# Patient Record
Sex: Male | Born: 1945
Health system: Southern US, Community
[De-identification: ages and names within clinical notes are randomized; demographics above are authoritative.]

## PROBLEM LIST (undated history)

## (undated) DIAGNOSIS — I2510 Atherosclerotic heart disease of native coronary artery without angina pectoris: Secondary | ICD-10-CM

## (undated) DIAGNOSIS — Z8739 Personal history of other diseases of the musculoskeletal system and connective tissue: Secondary | ICD-10-CM

## (undated) DIAGNOSIS — H269 Unspecified cataract: Secondary | ICD-10-CM

## (undated) DIAGNOSIS — K219 Gastro-esophageal reflux disease without esophagitis: Secondary | ICD-10-CM

## (undated) DIAGNOSIS — N433 Hydrocele, unspecified: Secondary | ICD-10-CM

## (undated) DIAGNOSIS — N529 Male erectile dysfunction, unspecified: Secondary | ICD-10-CM

## (undated) DIAGNOSIS — M1990 Unspecified osteoarthritis, unspecified site: Secondary | ICD-10-CM

## (undated) DIAGNOSIS — G4730 Sleep apnea, unspecified: Secondary | ICD-10-CM

## (undated) DIAGNOSIS — I10 Essential (primary) hypertension: Secondary | ICD-10-CM

## (undated) DIAGNOSIS — F419 Anxiety disorder, unspecified: Secondary | ICD-10-CM

## (undated) DIAGNOSIS — F32A Depression, unspecified: Secondary | ICD-10-CM

## (undated) DIAGNOSIS — E785 Hyperlipidemia, unspecified: Secondary | ICD-10-CM

## (undated) DIAGNOSIS — E119 Type 2 diabetes mellitus without complications: Secondary | ICD-10-CM

## (undated) DIAGNOSIS — E291 Testicular hypofunction: Secondary | ICD-10-CM

## (undated) HISTORY — DX: Anxiety disorder, unspecified: F41.9

## (undated) HISTORY — PX: HERNIA REPAIR: SHX51

## (undated) HISTORY — DX: Depression, unspecified: F32.A

## (undated) HISTORY — DX: Unspecified cataract: H26.9

## (undated) HISTORY — DX: Sleep apnea, unspecified: G47.30

## (undated) HISTORY — PX: APPENDECTOMY: SHX54

## (undated) HISTORY — PX: HEMORROIDECTOMY: SUR656

## (undated) HISTORY — PX: UMBILICAL HERNIA REPAIR: SHX196

## (undated) HISTORY — PX: CARDIAC CATHETERIZATION: SHX172

---

## 2000-12-30 ENCOUNTER — Encounter: Admission: RE | Admit: 2000-12-30 | Discharge: 2000-12-30 | Payer: Self-pay | Admitting: Nephrology

## 2000-12-30 ENCOUNTER — Encounter: Payer: Self-pay | Admitting: Nephrology

## 2001-01-06 ENCOUNTER — Encounter: Admission: RE | Admit: 2001-01-06 | Discharge: 2001-01-06 | Payer: Self-pay | Admitting: Nephrology

## 2001-01-06 ENCOUNTER — Encounter: Payer: Self-pay | Admitting: Nephrology

## 2001-11-18 ENCOUNTER — Ambulatory Visit (HOSPITAL_BASED_OUTPATIENT_CLINIC_OR_DEPARTMENT_OTHER): Admission: RE | Admit: 2001-11-18 | Discharge: 2001-11-18 | Payer: Self-pay | Admitting: General Surgery

## 2012-05-05 LAB — PROTIME-INR

## 2015-01-10 DIAGNOSIS — Z125 Encounter for screening for malignant neoplasm of prostate: Secondary | ICD-10-CM | POA: Diagnosis not present

## 2015-01-10 DIAGNOSIS — I10 Essential (primary) hypertension: Secondary | ICD-10-CM | POA: Diagnosis not present

## 2015-01-10 DIAGNOSIS — E782 Mixed hyperlipidemia: Secondary | ICD-10-CM | POA: Diagnosis not present

## 2015-01-10 DIAGNOSIS — E119 Type 2 diabetes mellitus without complications: Secondary | ICD-10-CM | POA: Diagnosis not present

## 2015-01-10 DIAGNOSIS — I2510 Atherosclerotic heart disease of native coronary artery without angina pectoris: Secondary | ICD-10-CM | POA: Diagnosis not present

## 2015-03-05 ENCOUNTER — Emergency Department (HOSPITAL_BASED_OUTPATIENT_CLINIC_OR_DEPARTMENT_OTHER)
Admission: EM | Admit: 2015-03-05 | Discharge: 2015-03-05 | Disposition: A | Discharge status: Home / Self Care | Payer: Medicare Other | Attending: Emergency Medicine | Admitting: Emergency Medicine

## 2015-03-05 ENCOUNTER — Encounter (HOSPITAL_BASED_OUTPATIENT_CLINIC_OR_DEPARTMENT_OTHER): Payer: Self-pay | Admitting: Emergency Medicine

## 2015-03-05 DIAGNOSIS — Z8739 Personal history of other diseases of the musculoskeletal system and connective tissue: Secondary | ICD-10-CM | POA: Diagnosis not present

## 2015-03-05 DIAGNOSIS — E119 Type 2 diabetes mellitus without complications: Secondary | ICD-10-CM | POA: Diagnosis not present

## 2015-03-05 DIAGNOSIS — J069 Acute upper respiratory infection, unspecified: Secondary | ICD-10-CM | POA: Insufficient documentation

## 2015-03-05 DIAGNOSIS — I10 Essential (primary) hypertension: Secondary | ICD-10-CM | POA: Diagnosis not present

## 2015-03-05 DIAGNOSIS — Z87891 Personal history of nicotine dependence: Secondary | ICD-10-CM | POA: Insufficient documentation

## 2015-03-05 DIAGNOSIS — R05 Cough: Secondary | ICD-10-CM | POA: Diagnosis not present

## 2015-03-05 HISTORY — DX: Hyperlipidemia, unspecified: E78.5

## 2015-03-05 HISTORY — DX: Essential (primary) hypertension: I10

## 2015-03-05 MED ORDER — GUAIFENESIN-CODEINE 100-10 MG/5ML PO SOLN: 10.0000 mL | Freq: Four times a day (QID) | ORAL | Status: DC | PRN

## 2015-03-05 NOTE — ED Notes (Signed)
Alert, NAD, calm, interactive, resps e/u, speaking in clear complete sentences, VSS, denies needs or questions, "ready to go", out with wife, Rx x1 given. Steady gait.

## 2015-03-05 NOTE — ED Notes (Signed)
Pt seen by EDP prior to RN assessment, see MD notes, orders received to d/c. Care assumed at time of d/c.

## 2015-03-05 NOTE — Discharge Instructions (Signed)
Robitussin with codeine as needed for cough.  Return to the ER if you develop significant worsening of your symptoms.   Upper Respiratory Infection, Adult An upper respiratory infection (URI) is also sometimes known as the common cold. The upper respiratory tract includes the nose, sinuses, throat, trachea, and bronchi. Bronchi are the airways leading to the lungs. Most people improve within 1 week, but symptoms can last up to 2 weeks. A residual cough may last even longer.  CAUSES Many different viruses can infect the tissues lining the upper respiratory tract. The tissues become irritated and inflamed and often become very moist. Mucus production is also common. A cold is contagious. You can easily spread the virus to others by oral contact. This includes kissing, sharing a glass, coughing, or sneezing. Touching your mouth or nose and then touching a surface, which is then touched by another person, can also spread the virus. SYMPTOMS  Symptoms typically develop 1 to 3 days after you come in contact with a cold virus. Symptoms vary from person to person. They may include:  Runny nose.  Sneezing.  Nasal congestion.  Sinus irritation.  Sore throat.  Loss of voice (laryngitis).  Cough.  Fatigue.  Muscle aches.  Loss of appetite.  Headache.  Low-grade fever. DIAGNOSIS  You might diagnose your own cold based on familiar symptoms, since most people get a cold 2 to 3 times a year. Your caregiver can confirm this based on your exam. Most importantly, your caregiver can check that your symptoms are not due to another disease such as strep throat, sinusitis, pneumonia, asthma, or epiglottitis. Blood tests, throat tests, and X-rays are not necessary to diagnose a common cold, but they may sometimes be helpful in excluding other more serious diseases. Your caregiver will decide if any further tests are required. RISKS AND COMPLICATIONS  You may be at risk for a more severe case of the  common cold if you smoke cigarettes, have chronic heart disease (such as heart failure) or lung disease (such as asthma), or if you have a weakened immune system. The very young and very old are also at risk for more serious infections. Bacterial sinusitis, middle ear infections, and bacterial pneumonia can complicate the common cold. The common cold can worsen asthma and chronic obstructive pulmonary disease (COPD). Sometimes, these complications can require emergency medical care and may be life-threatening. PREVENTION  The best way to protect against getting a cold is to practice good hygiene. Avoid oral or hand contact with people with cold symptoms. Wash your hands often if contact occurs. There is no clear evidence that vitamin C, vitamin E, echinacea, or exercise reduces the chance of developing a cold. However, it is always recommended to get plenty of rest and practice good nutrition. TREATMENT  Treatment is directed at relieving symptoms. There is no cure. Antibiotics are not effective, because the infection is caused by a virus, not by bacteria. Treatment may include:  Increased fluid intake. Sports drinks offer valuable electrolytes, sugars, and fluids.  Breathing heated mist or steam (vaporizer or shower).  Eating chicken soup or other clear broths, and maintaining good nutrition.  Getting plenty of rest.  Using gargles or lozenges for comfort.  Controlling fevers with ibuprofen or acetaminophen as directed by your caregiver.  Increasing usage of your inhaler if you have asthma. Zinc gel and zinc lozenges, taken in the first 24 hours of the common cold, can shorten the duration and lessen the severity of symptoms. Pain medicines may help  with fever, muscle aches, and throat pain. A variety of non-prescription medicines are available to treat congestion and runny nose. Your caregiver can make recommendations and may suggest nasal or lung inhalers for other symptoms.  HOME CARE  INSTRUCTIONS   Only take over-the-counter or prescription medicines for pain, discomfort, or fever as directed by your caregiver.  Use a warm mist humidifier or inhale steam from a shower to increase air moisture. This may keep secretions moist and make it easier to breathe.  Drink enough water and fluids to keep your urine clear or pale yellow.  Rest as needed.  Return to work when your temperature has returned to normal or as your caregiver advises. You may need to stay home longer to avoid infecting others. You can also use a face mask and careful hand washing to prevent spread of the virus. SEEK MEDICAL CARE IF:   After the first few days, you feel you are getting worse rather than better.  You need your caregiver's advice about medicines to control symptoms.  You develop chills, worsening shortness of breath, or brown or red sputum. These may be signs of pneumonia.  You develop yellow or brown nasal discharge or pain in the face, especially when you bend forward. These may be signs of sinusitis.  You develop a fever, swollen neck glands, pain with swallowing, or white areas in the back of your throat. These may be signs of strep throat. SEEK IMMEDIATE MEDICAL CARE IF:   You have a fever.  You develop severe or persistent headache, ear pain, sinus pain, or chest pain.  You develop wheezing, a prolonged cough, cough up blood, or have a change in your usual mucus (if you have chronic lung disease).  You develop sore muscles or a stiff neck. Document Released: 05/15/2001 Document Revised: 02/11/2012 Document Reviewed: 02/24/2014 [facility] Patient Information 2015 [place], [place]. This information is not intended to replace advice given to you by your health care provider. Make sure you discuss any questions you have with your health care provider.

## 2015-03-05 NOTE — ED Provider Notes (Signed)
CSN: [id number]     Arrival date & time 03/05/15  1930 History  This chart was scribed for Veryl Speak, MD by Jeanell Sparrow, ED Scribe. This patient was seen in room MH02/MH02 and the patient's care was started at 8:43 PM.    Chief Complaint  Patient presents with  . URI   Patient is a 69 y.o. male presenting with URI. The history is provided by the patient. No language interpreter was used.  URI Presenting symptoms: congestion, cough and sore throat   Presenting symptoms: no ear pain and no fever   Severity:  Moderate Onset quality:  Sudden Duration:  2 days Timing:  Constant Progression:  Unchanged Chronicity:  New Relieved by:  None tried Worsened by:  Nothing tried Ineffective treatments:  None tried Risk factors: sick contacts     HPI Comments: JAMAEL HOFFMANN is a 69 y.o. male who presents to the Emergency Department complaining of a constant moderate sore throat that started yesterday. He reports that he has been sick since yesterday with sore throat, nasal congestion, sinus pressure, intermittent moderate cough, and nose bleeds. He reports no modifying factors for the sore throat. He states that he has sick contacts. He reports that he is currently on a blood thinner. He denies any hx of smoking. He also denies any fever or ear pain.   Past Medical History  Diagnosis Date  . Hypertension   . Diabetes mellitus without complication   . Hyperlipemia   . Gout    History reviewed. No pertinent past surgical history. History reviewed. No pertinent family history. History  Substance Use Topics  . Smoking status: Former Research scientist (life sciences)  . Smokeless tobacco: Not on file  . Alcohol Use: No    Review of Systems  Constitutional: Negative for fever.  HENT: Positive for congestion, nosebleeds and sore throat. Negative for ear pain and sinus pressure.   Respiratory: Positive for cough.   Hematological: Bruises/bleeds easily.   Allergies  Review of patient's allergies indicates no known  allergies.  Home Medications   Prior to Admission medications   Not on File   BP 160/90 mmHg  Pulse 86  Temp(Src) 97.8 F (36.6 C) (Oral)  Resp 18  SpO2 96% Physical Exam  Constitutional: He is oriented to person, place, and time. He appears well-developed and well-nourished. No distress.  HENT:  Head: Normocephalic and atraumatic.  Mouth/Throat: Oropharynx is clear and moist. No oropharyngeal exudate.  Maxillary facial TTP.   Eyes: Conjunctivae and EOM are normal. Pupils are equal, round, and reactive to light.  Neck: Normal range of motion. Neck supple.  Cardiovascular: Normal rate, regular rhythm, normal heart sounds and intact distal pulses.   No murmur heard. Pulmonary/Chest: Effort normal and breath sounds normal. No respiratory distress.  Abdominal: Soft. There is no tenderness. There is no rebound and no guarding.  Musculoskeletal: Normal range of motion. He exhibits no edema or tenderness.  Neurological: He is alert and oriented to person, place, and time. No cranial nerve deficit. He exhibits normal muscle tone. Coordination normal.  Skin: Skin is warm.  Psychiatric: He has a normal mood and affect. His behavior is normal.  Nursing note and vitals reviewed.   ED Course  Procedures (including critical care time) DIAGNOSTIC STUDIES: Oxygen Saturation is 96% on RA, normal by my interpretation.    COORDINATION OF CARE: 8:47 PM- Pt advised of plan for treatment which includes medication and pt agrees.  Labs Review Labs Reviewed - No data to display  Imaging Review No results found.   EKG Interpretation None      MDM   Final diagnoses:  None    Patient presents with URI-like symptoms that started yesterday. Symptoms are likely viral in nature. His lungs are clear and oxygen saturations are normal. I see no indication for antibiotics. He is requesting something for cough and will be prescribed Robitussin with codeine which she can take as needed.  I  personally performed the services described in this documentation, which was scribed in my presence. The recorded information has been reviewed and is accurate.       Veryl Speak, MD 03/06/15 [phone]

## 2015-03-05 NOTE — ED Notes (Signed)
Pt reports symptoms onset 1 day ago OTC medication ineffective

## 2015-04-11 DIAGNOSIS — E119 Type 2 diabetes mellitus without complications: Secondary | ICD-10-CM | POA: Diagnosis not present

## 2015-04-11 DIAGNOSIS — I10 Essential (primary) hypertension: Secondary | ICD-10-CM | POA: Diagnosis not present

## 2015-04-11 DIAGNOSIS — I2510 Atherosclerotic heart disease of native coronary artery without angina pectoris: Secondary | ICD-10-CM | POA: Diagnosis not present

## 2015-04-11 DIAGNOSIS — E669 Obesity, unspecified: Secondary | ICD-10-CM | POA: Diagnosis not present

## 2015-04-11 DIAGNOSIS — E782 Mixed hyperlipidemia: Secondary | ICD-10-CM | POA: Diagnosis not present

## 2015-04-11 DIAGNOSIS — N529 Male erectile dysfunction, unspecified: Secondary | ICD-10-CM | POA: Diagnosis not present

## 2015-04-26 DIAGNOSIS — Z62891 Sibling rivalry: Secondary | ICD-10-CM | POA: Diagnosis not present

## 2015-04-26 DIAGNOSIS — Z9149 Other personal history of psychological trauma, not elsewhere classified: Secondary | ICD-10-CM | POA: Diagnosis not present

## 2015-04-26 DIAGNOSIS — F4310 Post-traumatic stress disorder, unspecified: Secondary | ICD-10-CM | POA: Diagnosis not present

## 2015-06-02 DIAGNOSIS — J0110 Acute frontal sinusitis, unspecified: Secondary | ICD-10-CM | POA: Diagnosis not present

## 2015-06-02 DIAGNOSIS — E119 Type 2 diabetes mellitus without complications: Secondary | ICD-10-CM | POA: Diagnosis not present

## 2015-06-21 DIAGNOSIS — Z62891 Sibling rivalry: Secondary | ICD-10-CM | POA: Diagnosis not present

## 2015-06-21 DIAGNOSIS — Z9149 Other personal history of psychological trauma, not elsewhere classified: Secondary | ICD-10-CM | POA: Diagnosis not present

## 2015-06-21 DIAGNOSIS — F4310 Post-traumatic stress disorder, unspecified: Secondary | ICD-10-CM | POA: Diagnosis not present

## 2015-07-07 DIAGNOSIS — K219 Gastro-esophageal reflux disease without esophagitis: Secondary | ICD-10-CM | POA: Diagnosis not present

## 2015-07-26 DIAGNOSIS — F4310 Post-traumatic stress disorder, unspecified: Secondary | ICD-10-CM | POA: Diagnosis not present

## 2015-07-26 DIAGNOSIS — Z9149 Other personal history of psychological trauma, not elsewhere classified: Secondary | ICD-10-CM | POA: Diagnosis not present

## 2015-07-26 DIAGNOSIS — Z62891 Sibling rivalry: Secondary | ICD-10-CM | POA: Diagnosis not present

## 2015-08-01 DIAGNOSIS — E119 Type 2 diabetes mellitus without complications: Secondary | ICD-10-CM | POA: Diagnosis not present

## 2015-08-01 DIAGNOSIS — I10 Essential (primary) hypertension: Secondary | ICD-10-CM | POA: Diagnosis not present

## 2015-08-01 DIAGNOSIS — Z1329 Encounter for screening for other suspected endocrine disorder: Secondary | ICD-10-CM | POA: Diagnosis not present

## 2015-08-25 DIAGNOSIS — E669 Obesity, unspecified: Secondary | ICD-10-CM | POA: Diagnosis not present

## 2015-08-25 DIAGNOSIS — I2510 Atherosclerotic heart disease of native coronary artery without angina pectoris: Secondary | ICD-10-CM | POA: Diagnosis not present

## 2015-08-25 DIAGNOSIS — Z23 Encounter for immunization: Secondary | ICD-10-CM | POA: Diagnosis not present

## 2015-08-25 DIAGNOSIS — E119 Type 2 diabetes mellitus without complications: Secondary | ICD-10-CM | POA: Diagnosis not present

## 2015-08-25 DIAGNOSIS — E785 Hyperlipidemia, unspecified: Secondary | ICD-10-CM | POA: Diagnosis not present

## 2015-08-25 DIAGNOSIS — I10 Essential (primary) hypertension: Secondary | ICD-10-CM | POA: Diagnosis not present

## 2015-09-22 DIAGNOSIS — E119 Type 2 diabetes mellitus without complications: Secondary | ICD-10-CM | POA: Diagnosis not present

## 2015-09-22 DIAGNOSIS — H52223 Regular astigmatism, bilateral: Secondary | ICD-10-CM | POA: Diagnosis not present

## 2015-09-22 DIAGNOSIS — H04123 Dry eye syndrome of bilateral lacrimal glands: Secondary | ICD-10-CM | POA: Diagnosis not present

## 2015-09-22 DIAGNOSIS — H2513 Age-related nuclear cataract, bilateral: Secondary | ICD-10-CM | POA: Diagnosis not present

## 2015-09-22 DIAGNOSIS — H524 Presbyopia: Secondary | ICD-10-CM | POA: Diagnosis not present

## 2015-11-01 DIAGNOSIS — B301 Conjunctivitis due to adenovirus: Secondary | ICD-10-CM | POA: Diagnosis not present

## 2015-11-08 ENCOUNTER — Encounter (HOSPITAL_BASED_OUTPATIENT_CLINIC_OR_DEPARTMENT_OTHER): Payer: Self-pay | Admitting: *Deleted

## 2015-11-08 ENCOUNTER — Emergency Department (HOSPITAL_BASED_OUTPATIENT_CLINIC_OR_DEPARTMENT_OTHER): Payer: Medicare Other

## 2015-11-08 ENCOUNTER — Emergency Department (HOSPITAL_BASED_OUTPATIENT_CLINIC_OR_DEPARTMENT_OTHER)
Admission: EM | Admit: 2015-11-08 | Discharge: 2015-11-08 | Disposition: A | Discharge status: Home / Self Care | Payer: Medicare Other | Attending: Emergency Medicine | Admitting: Emergency Medicine

## 2015-11-08 DIAGNOSIS — Z87891 Personal history of nicotine dependence: Secondary | ICD-10-CM | POA: Diagnosis not present

## 2015-11-08 DIAGNOSIS — I10 Essential (primary) hypertension: Secondary | ICD-10-CM | POA: Diagnosis not present

## 2015-11-08 DIAGNOSIS — J029 Acute pharyngitis, unspecified: Secondary | ICD-10-CM

## 2015-11-08 DIAGNOSIS — Z8719 Personal history of other diseases of the digestive system: Secondary | ICD-10-CM | POA: Diagnosis not present

## 2015-11-08 DIAGNOSIS — E119 Type 2 diabetes mellitus without complications: Secondary | ICD-10-CM | POA: Diagnosis not present

## 2015-11-08 DIAGNOSIS — R05 Cough: Secondary | ICD-10-CM | POA: Diagnosis not present

## 2015-11-08 DIAGNOSIS — R059 Cough, unspecified: Secondary | ICD-10-CM

## 2015-11-08 DIAGNOSIS — R058 Other specified cough: Secondary | ICD-10-CM

## 2015-11-08 MED ORDER — PANTOPRAZOLE SODIUM 20 MG PO TBEC: 20.0000 mg | DELAYED_RELEASE_TABLET | Freq: Every day | ORAL | Status: DC

## 2015-11-08 MED ORDER — AMOXICILLIN-POT CLAVULANATE 875-125 MG PO TABS: 1.0000 | ORAL_TABLET | Freq: Two times a day (BID) | ORAL | Status: DC

## 2015-11-08 MED ORDER — HYDROCODONE-HOMATROPINE 5-1.5 MG/5ML PO SYRP: 5.0000 mL | ORAL_SOLUTION | Freq: Four times a day (QID) | ORAL | Status: DC | PRN

## 2015-11-08 NOTE — Discharge Instructions (Signed)
1. Medications: Augmentin, Hycodan, Protonix, usual home medications 2. Treatment: rest, drink plenty of fluids, take tylenol or ibuprofen for fever control; stopped taking Prilosec 3. Follow Up: Please followup with your primary doctor in 3 days for discussion of your diagnoses and further evaluation after today's visit; if you do not have a primary care doctor use the resource guide provided to find one; Return to the ER for high fevers, difficulty breathing or other concerning symptoms   Gastroesophageal Reflux Disease, Adult Normally, food travels down the esophagus and stays in the stomach to be digested. However, when a person has gastroesophageal reflux disease (GERD), food and stomach acid move back up into the esophagus. When this happens, the esophagus becomes sore and inflamed. Over time, GERD can create small holes (ulcers) in the lining of the esophagus.  CAUSES This condition is caused by a problem with the muscle between the esophagus and the stomach (lower esophageal sphincter, or LES). Normally, the LES muscle closes after food passes through the esophagus to the stomach. When the LES is weakened or abnormal, it does not close properly, and that allows food and stomach acid to go back up into the esophagus. The LES can be weakened by certain dietary substances, medicines, and medical conditions, including:  Tobacco use.  Pregnancy.  Having a hiatal hernia.  Heavy alcohol use.  Certain foods and beverages, such as coffee, chocolate, onions, and peppermint. RISK FACTORS This condition is more likely to develop in:  People who have an increased body weight.  People who have connective tissue disorders.  People who use NSAID medicines. SYMPTOMS Symptoms of this condition include:  Heartburn.  Difficult or painful swallowing.  The feeling of having a lump in the throat.  Abitter taste in the mouth.  Bad breath.  Having a large amount of saliva.  Having an upset  or bloated stomach.  Belching.  Chest pain.  Shortness of breath or wheezing.  Ongoing (chronic) cough or a night-time cough.  Wearing away of tooth enamel.  Weight loss. Different conditions can cause chest pain. Make sure to see your health care provider if you experience chest pain. DIAGNOSIS Your health care provider will take a medical history and perform a physical exam. To determine if you have mild or severe GERD, your health care provider may also monitor how you respond to treatment. You may also have other tests, including:  An endoscopy toexamine your stomach and esophagus with a small camera.  A test thatmeasures the acidity level in your esophagus.  A test thatmeasures how much pressure is on your esophagus.  A barium swallow or modified barium swallow to show the shape, size, and functioning of your esophagus. TREATMENT The goal of treatment is to help relieve your symptoms and to prevent complications. Treatment for this condition may vary depending on how severe your symptoms are. Your health care provider may recommend:  Changes to your diet.  Medicine.  Surgery. HOME CARE INSTRUCTIONS Diet  Follow a diet as recommended by your health care provider. This may involve avoiding foods and drinks such as:  Coffee and tea (with or without caffeine).  Drinks that containalcohol.  Energy drinks and sports drinks.  Carbonated drinks or sodas.  Chocolate and cocoa.  Peppermint and mint flavorings.  Garlic and onions.  Horseradish.  Spicy and acidic foods, including peppers, chili powder, curry powder, vinegar, hot sauces, and barbecue sauce.  Citrus fruit juices and citrus fruits, such as oranges, lemons, and limes.  Tomato-based foods,  such as red sauce, chili, salsa, and pizza with red sauce.  Fried and fatty foods, such as donuts, french fries, potato chips, and high-fat dressings.  High-fat meats, such as hot dogs and fatty cuts of red and  white meats, such as rib eye steak, sausage, ham, and bacon.  High-fat dairy items, such as whole milk, butter, and cream cheese.  Eat small, frequent meals instead of large meals.  Avoid drinking large amounts of liquid with your meals.  Avoid eating meals during the 2-3 hours before bedtime.  Avoid lying down right after you eat.  Do not exercise right after you eat. General Instructions  Pay attention to any changes in your symptoms.  Take over-the-counter and prescription medicines only as told by your health care provider. Do not take aspirin, ibuprofen, or other NSAIDs unless your health care provider told you to do so.  Do not use any tobacco products, including cigarettes, chewing tobacco, and e-cigarettes. If you need help quitting, ask your health care provider.  Wear loose-fitting clothing. Do not wear anything tight around your waist that causes pressure on your abdomen.  Raise (elevate) the head of your bed 6 inches (15cm).  Try to reduce your stress, such as with yoga or meditation. If you need help reducing stress, ask your health care provider.  If you are overweight, reduce your weight to an amount that is healthy for you. Ask your health care provider for guidance about a safe weight loss goal.  Keep all follow-up visits as told by your health care provider. This is important. SEEK MEDICAL CARE IF:  You have new symptoms.  You have unexplained weight loss.  You have difficulty swallowing, or it hurts to swallow.  You have wheezing or a persistent cough.  Your symptoms do not improve with treatment.  You have a hoarse voice. SEEK IMMEDIATE MEDICAL CARE IF:  You have pain in your arms, neck, jaw, teeth, or back.  You feel sweaty, dizzy, or light-headed.  You have chest pain or shortness of breath.  You vomit and your vomit looks like blood or coffee grounds.  You faint.  Your stool is bloody or black.  You cannot swallow, drink, or eat.     This information is not intended to replace advice given to you by your health care provider. Make sure you discuss any questions you have with your health care provider.   Document Released: 08/29/2005 Document Revised: 08/10/2015 Document Reviewed: 03/16/2015 Elsevier Interactive Patient Education [employer].

## 2015-11-08 NOTE — ED Provider Notes (Signed)
CSN: [id number]     Arrival date & time 11/08/15  1326 History   First MD Initiated Contact with Patient 11/08/15 1446     Chief Complaint  Patient presents with  . Cough     (Consider location/radiation/quality/duration/timing/severity/associated sxs/prior Treatment) The history is provided by the patient and medical records. No language interpreter was used.     Steven Payne is a 69 y.o. male  with a hx of Gout, HTN, NIDDM, hyperlipidemia, GERD presents to the Emergency Department complaining of gradual, persistent, progressively worsening reflux symptoms, productive cough onset 3 weeks ago.  Pt reports his sputum has been rust colored for approx 1 week.  He reports the change in sputum and associated rhinorrhea, sore throat began while on a cruise to the [place].  Pt reports he was seen by the MD on the ship and given an antibiotic x 5 (erythromycin) days without relief.  Pt reports the antibiotic did not help his symptoms.  Pt reports he takes prilosec for his reflux without relief.  He denies chest pain, shortness of breath, dyspnea on exertion. He reports that he has symptoms of acid wash which wake him from sleep with associated coughing.  He denies fever, chills, headache, neck pain, chest pain, shortness of breath, abdominal pain, nausea, vomiting, diarrhea, weakness, dizziness, syncope.   Past Medical History  Diagnosis Date  . Hypertension   . Diabetes mellitus without complication ([place])   . Hyperlipemia   . Gout    History reviewed. No pertinent past surgical history. No family history on file. Social History  Substance Use Topics  . Smoking status: Former Research scientist (life sciences)  . Smokeless tobacco: None  . Alcohol Use: No    Review of Systems  Constitutional: Negative for fever, chills, diaphoresis, appetite change, fatigue and unexpected weight change.  HENT: Positive for congestion, postnasal drip, rhinorrhea, sinus pressure and sore throat. Negative for ear discharge, ear pain  and mouth sores.   Eyes: Negative for visual disturbance.  Respiratory: Positive for cough. Negative for chest tightness, shortness of breath, wheezing and stridor.   Cardiovascular: Negative for chest pain, palpitations and leg swelling.  Gastrointestinal: Negative for nausea, vomiting, abdominal pain, diarrhea and constipation.  Endocrine: Negative for polydipsia, polyphagia and polyuria.  Genitourinary: Negative for dysuria, urgency, frequency and hematuria.  Musculoskeletal: Negative for myalgias, back pain, arthralgias and neck stiffness.  Skin: Negative for rash.  Allergic/Immunologic: Negative for immunocompromised state.  Neurological: Negative for syncope, light-headedness, numbness and headaches.  Hematological: Negative for adenopathy. Does not bruise/bleed easily.  Psychiatric/Behavioral: Negative for sleep disturbance. The patient is not nervous/anxious.   All other systems reviewed and are negative.     Allergies  Review of patient's allergies indicates no known allergies.  Home Medications   Prior to Admission medications   Medication Sig Start Date End Date Taking? Authorizing Provider  amoxicillin-clavulanate (AUGMENTIN) 875-125 MG tablet Take 1 tablet by mouth every 12 (twelve) hours. 11/08/15   Paisli Silfies, PA-C  guaiFENesin-codeine 100-10 MG/5ML syrup Take 10 mLs by mouth every 6 (six) hours as needed for cough. 03/05/15   Veryl Speak, MD  HYDROcodone-homatropine [facility]) 5-1.5 MG/5ML syrup Take 5 mLs by mouth every 6 (six) hours as needed for cough. 11/08/15   Tyreak Reagle, PA-C  pantoprazole (PROTONIX) 20 MG tablet Take 1 tablet (20 mg total) by mouth daily. 11/08/15   Keegan Ducey, PA-C   BP 141/85 mmHg  Pulse 68  Temp(Src) 98.6 F (37 C) (Oral)  Resp 20  Ht 5'  6" (1.676 m)  Wt 117.935 kg  BMI 41.99 kg/m2  SpO2 98% Physical Exam  Constitutional: He is oriented to person, place, and time. He appears well-developed and well-nourished. No  distress.  Awake, alert, nontoxic appearance  HENT:  Head: Normocephalic and atraumatic.  Right Ear: Tympanic membrane, external ear and ear canal normal.  Left Ear: Tympanic membrane, external ear and ear canal normal.  Nose: Mucosal edema and rhinorrhea present. No epistaxis. Right sinus exhibits no maxillary sinus tenderness and no frontal sinus tenderness. Left sinus exhibits no maxillary sinus tenderness and no frontal sinus tenderness.  Mouth/Throat: Uvula is midline and mucous membranes are normal. Mucous membranes are not pale and not cyanotic. Posterior oropharyngeal erythema present. No oropharyngeal exudate, posterior oropharyngeal edema or tonsillar abscesses.  Mild erythema of the posterior oropharynx without edema or exudate  Eyes: Conjunctivae are normal. Pupils are equal, round, and reactive to light. No scleral icterus.  Neck: Normal range of motion and full passive range of motion without pain. Neck supple.  No stridor, normal phonation, handling secretions without difficulty  Cardiovascular: Normal rate, regular rhythm, normal heart sounds and intact distal pulses.   Pulmonary/Chest: Effort normal and breath sounds normal. No stridor. No respiratory distress. He has no wheezes.  Clear and equal breath sounds without focal wheezes, rhonchi, rales  Abdominal: Soft. Bowel sounds are normal. He exhibits no mass. There is no tenderness. There is no rebound and no guarding.  Musculoskeletal: Normal range of motion. He exhibits no edema.  Lymphadenopathy:    He has no cervical adenopathy.  Neurological: He is alert and oriented to person, place, and time.  Speech is clear and goal oriented Moves extremities without ataxia  Skin: Skin is warm and dry. No rash noted. He is not diaphoretic.  Psychiatric: He has a normal mood and affect.  Nursing note and vitals reviewed.   ED Course  Procedures (including critical care time)  Imaging Review Dg Chest 2 View  11/08/2015   CLINICAL DATA:  Cough for 1 week EXAM: CHEST  2 VIEW COMPARISON:  None currently available FINDINGS: Normal heart size and mild aortic tortuosity. No acute infiltrate or edema. No effusion or pneumothorax. No acute osseous findings. IMPRESSION: No evidence of acute cardiopulmonary disease. Electronically Signed   By: Monte Fantasia M.D.   On: 11/08/2015 14:22   I have personally reviewed and evaluated these images and lab results as part of my medical decision-making.    MDM   Final diagnoses:  Cough  Reflux pharyngitis  Productive cough   [name] presents with cough 3 weeks worsening while on vacation.  Patient is a non-insulin-dependent diabetic.  He has had 3 weeks of cough with now rest colored sputum. He is afebrile without hypoxia or shortness of breath.  Concern for possible aspiration pneumonia with symptoms of acid wash at night and complaints of GERD. Patient is without chest pain, shortness of breath, dyspnea on exertion. Highly doubt ACS.  Patient is well-appearing here in the emergency department.  Pt CXR negative for acute infiltrate.  Pt will be discharged with symptomatic treatment.  Verbalizes understanding and is agreeable with plan. Pt is hemodynamically stable & in NAD prior to dc.  BP 141/85 mmHg  Pulse 68  Temp(Src) 98.6 F (37 C) (Oral)  Resp 20  Ht 5\' 6"  (1.676 m)  Wt 117.935 kg  BMI 41.99 kg/m2  SpO2 98%  The patient was discussed with Dr. Stark Jock who agrees with the treatment plan.  Jarrett Soho Chelbi Herber, PA-C 11/08/15 [place], MD 11/10/15 [phone]

## 2015-11-08 NOTE — ED Notes (Signed)
Cough for a week since cruise to the [place].

## 2015-11-16 DIAGNOSIS — B301 Conjunctivitis due to adenovirus: Secondary | ICD-10-CM | POA: Diagnosis not present

## 2015-12-26 DIAGNOSIS — Z9149 Other personal history of psychological trauma, not elsewhere classified: Secondary | ICD-10-CM | POA: Diagnosis not present

## 2015-12-26 DIAGNOSIS — F4310 Post-traumatic stress disorder, unspecified: Secondary | ICD-10-CM | POA: Diagnosis not present

## 2015-12-26 DIAGNOSIS — Z62891 Sibling rivalry: Secondary | ICD-10-CM | POA: Diagnosis not present

## 2016-01-01 ENCOUNTER — Encounter (HOSPITAL_BASED_OUTPATIENT_CLINIC_OR_DEPARTMENT_OTHER): Payer: Self-pay | Admitting: Emergency Medicine

## 2016-01-01 ENCOUNTER — Emergency Department (HOSPITAL_BASED_OUTPATIENT_CLINIC_OR_DEPARTMENT_OTHER)
Admission: EM | Admit: 2016-01-01 | Discharge: 2016-01-01 | Disposition: A | Discharge status: Home / Self Care | Payer: Medicare Other | Attending: Emergency Medicine | Admitting: Emergency Medicine

## 2016-01-01 DIAGNOSIS — R040 Epistaxis: Secondary | ICD-10-CM | POA: Diagnosis not present

## 2016-01-01 DIAGNOSIS — Z7984 Long term (current) use of oral hypoglycemic drugs: Secondary | ICD-10-CM | POA: Diagnosis not present

## 2016-01-01 DIAGNOSIS — I10 Essential (primary) hypertension: Secondary | ICD-10-CM | POA: Diagnosis not present

## 2016-01-01 DIAGNOSIS — Z87891 Personal history of nicotine dependence: Secondary | ICD-10-CM | POA: Insufficient documentation

## 2016-01-01 DIAGNOSIS — Z79899 Other long term (current) drug therapy: Secondary | ICD-10-CM | POA: Insufficient documentation

## 2016-01-01 DIAGNOSIS — E119 Type 2 diabetes mellitus without complications: Secondary | ICD-10-CM | POA: Insufficient documentation

## 2016-01-01 DIAGNOSIS — Z7902 Long term (current) use of antithrombotics/antiplatelets: Secondary | ICD-10-CM | POA: Insufficient documentation

## 2016-01-01 DIAGNOSIS — E785 Hyperlipidemia, unspecified: Secondary | ICD-10-CM | POA: Insufficient documentation

## 2016-01-01 DIAGNOSIS — Z794 Long term (current) use of insulin: Secondary | ICD-10-CM | POA: Insufficient documentation

## 2016-01-01 DIAGNOSIS — Z8739 Personal history of other diseases of the musculoskeletal system and connective tissue: Secondary | ICD-10-CM | POA: Diagnosis not present

## 2016-01-01 NOTE — Discharge Instructions (Signed)
Please follow-up with your doctor next week for reevaluation as needed. Return to ED for any new or worsening symptoms as we discussed.  Nosebleed Nosebleeds are common. They are due to a crack in the inside lining of your nose (mucous membrane) or from a small blood vessel that starts to bleed. Nosebleeds can be caused by many conditions, such as injury, infections, dry mucous membranes or dry climate, medicines, nose picking, and home heating and cooling systems. Most nosebleeds come from blood vessels in the front of your nose. HOME CARE INSTRUCTIONS   Try controlling your nosebleed by pinching your nostrils gently and continuously for at least 10 minutes.  Avoid blowing or sniffing your nose for a number of hours after having a nosebleed.  Do not put gauze inside your nose yourself. If your nose was packed by your health care provider, try to maintain the pack inside of your nose until your health care provider removes it.  If a gauze pack was used and it starts to fall out, gently replace it or cut off the end of it.  If a balloon catheter was used to pack your nose, do not cut or remove it unless your health care provider has instructed you to do that.  Avoid lying down while you are having a nosebleed. Sit up and lean forward.  Use a nasal spray decongestant to help with a nosebleed as directed by your health care provider.  Do not use petroleum jelly or mineral oil in your nose. These can drip into your lungs.  Maintain humidity in your home by using less air conditioning or by using a humidifier.  Aspirinand blood thinners make bleeding more likely. If you are prescribed these medicines and you suffer from nosebleeds, ask your health care provider if you should stop taking the medicines or adjust the dose. Do not stop medicines unless directed by your health care provider  Resume your normal activities as you are able, but avoid straining, lifting, or bending at the waist for  several days.  If your nosebleed was caused by dry mucous membranes, use over-the-counter saline nasal spray or gel. This will keep the mucous membranes moist and allow them to heal. If you must use a lubricant, choose the water-soluble variety. Use it only sparingly, and do not use it within several hours of lying down.  Keep all follow-up visits as directed by your health care provider. This is important. SEEK MEDICAL CARE IF:  You have a fever.  You get frequent nosebleeds.  You are getting nosebleeds more often. SEEK IMMEDIATE MEDICAL CARE IF:  Your nosebleed lasts longer than 20 minutes.  Your nosebleed occurs after an injury to your face, and your nose looks crooked or broken.  You have unusual bleeding from other parts of your body.  You have unusual bruising on other parts of your body.  You feel light-headed or you faint.  You become sweaty.  You vomit blood.  Your nosebleed occurs after a head injury.   This information is not intended to replace advice given to you by your health care provider. Make sure you discuss any questions you have with your health care provider.   Document Released: 08/29/2005 Document Revised: 12/10/2014 Document Reviewed: 07/05/2014 Elsevier Interactive Patient Education [employer].

## 2016-01-01 NOTE — ED Notes (Signed)
Drip pad applied to monitor drainage/ bleeding from nare

## 2016-01-01 NOTE — ED Provider Notes (Signed)
CSN: [id number]     Arrival date & time 01/01/16  1434 History   First MD Initiated Contact with Patient 01/01/16 1635     Chief Complaint  Patient presents with  . Epistaxis     (Consider location/radiation/quality/duration/timing/severity/associated sxs/prior Treatment) HPI Steven Payne is a 70 y.o. male history of diabetes, hypertension, currently on Plavix therapy, comes in for evaluation of nosebleed. Patient reports approximately 1:00 this afternoon, after blowing his nose hard, he started having nosebleed did not go away. He taped gauze around his nose which seemed to help somewhat. He denies any headache, vision changes, chest pain, shortness of breath. No epistaxis in the ED. Denies any other symptoms. No other modifying factors  Past Medical History  Diagnosis Date  . Hypertension   . Diabetes mellitus without complication ([employer])   . Hyperlipemia   . Gout    Past Surgical History  Procedure Laterality Date  . Appendectomy    . Hernia repair     No family history on file. Social History  Substance Use Topics  . Smoking status: Former Research scientist (life sciences)  . Smokeless tobacco: None  . Alcohol Use: No    Review of Systems A 10 point review of systems was completed and was negative except for pertinent positives and negatives as mentioned in the history of present illness     Allergies  Review of patient's allergies indicates no known allergies.  Home Medications   Prior to Admission medications   Medication Sig Start Date End Date Taking? Authorizing Provider  Albiglutide 50 MG PEN Inject into the skin.   Yes Historical Provider, MD  atorvastatin (LIPITOR) 10 MG tablet Take 10 mg by mouth daily.   Yes Historical Provider, MD  clopidogrel (PLAVIX) 75 MG tablet Take 75 mg by mouth daily.   Yes Historical Provider, MD  diltiazem (TIAZAC) 300 MG 24 hr capsule Take 300 mg by mouth daily.   Yes Historical Provider, MD  ezetimibe (ZETIA) 10 MG tablet Take 10 mg by mouth daily.    Yes Historical Provider, MD  famotidine (PEPCID) 40 MG tablet Take 40 mg by mouth daily.   Yes Historical Provider, MD  metFORMIN (GLUCOPHAGE) 1000 MG tablet Take 1,000 mg by mouth 2 (two) times daily with a meal.   Yes Historical Provider, MD  rosuvastatin (CRESTOR) 20 MG tablet Take 20 mg by mouth daily.   Yes Historical Provider, MD  amoxicillin-clavulanate (AUGMENTIN) 875-125 MG tablet Take 1 tablet by mouth every 12 (twelve) hours. 11/08/15   Hannah Muthersbaugh, PA-C  guaiFENesin-codeine 100-10 MG/5ML syrup Take 10 mLs by mouth every 6 (six) hours as needed for cough. 03/05/15   Veryl Speak, MD  HYDROcodone-homatropine [facility]) 5-1.5 MG/5ML syrup Take 5 mLs by mouth every 6 (six) hours as needed for cough. 11/08/15   Hannah Muthersbaugh, PA-C  pantoprazole (PROTONIX) 20 MG tablet Take 1 tablet (20 mg total) by mouth daily. 11/08/15   Hannah Muthersbaugh, PA-C   BP 136/76 mmHg  Pulse 64  Temp(Src) 98 F (36.7 C) (Oral)  Resp 18  Ht 5\' 6"  (1.676 m)  Wt 123.378 kg  BMI 43.92 kg/m2  SpO2 99% Physical Exam  Constitutional:  Awake, alert, nontoxic appearance. Overall well-appearing African-American male  HENT:  Head: Atraumatic.  Very small blood clot noted to left nares, medial septum. Hemostasis achieved. Oropharynx unremarkable  Eyes: Right eye exhibits no discharge. Left eye exhibits no discharge.  Neck: Neck supple.  Pulmonary/Chest: Effort normal. He exhibits no tenderness.  Abdominal: Soft. There  is no tenderness. There is no rebound.  Musculoskeletal: He exhibits no tenderness.  Baseline ROM, no obvious new focal weakness.  Neurological:  Mental status and motor strength appears baseline for patient and situation.  Skin: No rash noted.  Psychiatric: He has a normal mood and affect.  Nursing note and vitals reviewed.   ED Course  Procedures (including critical care time) Labs Review Labs Reviewed - No data to display  Imaging Review No results found. I have personally  reviewed and evaluated these images and lab results as part of my medical decision-making.   EKG Interpretation None     Filed Vitals:   01/01/16 1448 01/01/16 1741  BP: 145/91 136/76  Pulse: 68 64  Temp: 98 F (36.7 C)   TempSrc: Oral   Resp: 18 18  Height: 5\' 6"  (1.676 m)   Weight: 123.378 kg   SpO2: 99% 99%    MDM  Steven Payne is a 70 y.o. male on Plavix therapy who presents to the ED for epistaxis after blowing his nose too hard. Hemostasis achieved prior to arrival. Monitored in the ED for one hour with no repeat bleeding. He overall appears very well, nontoxic, hemodynamically stable and appropriate for outpatient follow-up. Will follow with PCP next week. Final diagnoses:  Epistaxis       Comer Locket, PA-C 01/01/16 [place], MD 01/01/16 2258

## 2016-01-01 NOTE — ED Notes (Signed)
Pt on blood thinners and states has been blowing nose a bit more since yesterday and today nose began bleeding and hasn't stopped.  Bleed presently controlled with pressure cotton ball inserted.

## 2016-01-01 NOTE — ED Notes (Addendum)
Presents with bleeding from Left Nare, states onset today, at approx 1300hrs, bleeding occurred after blowing his nose per pt statement. States is on Plavix after having a cardiac cath. States has been on Plavix for the past 3 years. No bleeding noted at this time, appears to have a small blood clot in left nare at this time

## 2016-01-05 DIAGNOSIS — F4310 Post-traumatic stress disorder, unspecified: Secondary | ICD-10-CM | POA: Diagnosis not present

## 2016-01-05 DIAGNOSIS — Z62891 Sibling rivalry: Secondary | ICD-10-CM | POA: Diagnosis not present

## 2016-01-05 DIAGNOSIS — Z9149 Other personal history of psychological trauma, not elsewhere classified: Secondary | ICD-10-CM | POA: Diagnosis not present

## 2016-02-21 ENCOUNTER — Telehealth: Payer: Self-pay

## 2016-02-22 ENCOUNTER — Encounter: Payer: Self-pay | Admitting: Family

## 2016-02-22 ENCOUNTER — Ambulatory Visit (INDEPENDENT_AMBULATORY_CARE_PROVIDER_SITE_OTHER): Payer: Medicare Other | Admitting: Family

## 2016-02-22 VITALS — BP 146/86 | HR 64 | Temp 98.5°F | Resp 18 | Ht 66.0 in | Wt 278.4 lb

## 2016-02-22 DIAGNOSIS — E119 Type 2 diabetes mellitus without complications: Secondary | ICD-10-CM | POA: Diagnosis not present

## 2016-02-22 DIAGNOSIS — M1A9XX0 Chronic gout, unspecified, without tophus (tophi): Secondary | ICD-10-CM

## 2016-02-22 DIAGNOSIS — E785 Hyperlipidemia, unspecified: Secondary | ICD-10-CM | POA: Diagnosis not present

## 2016-02-22 DIAGNOSIS — I2510 Atherosclerotic heart disease of native coronary artery without angina pectoris: Secondary | ICD-10-CM

## 2016-02-22 DIAGNOSIS — I10 Essential (primary) hypertension: Secondary | ICD-10-CM

## 2016-02-22 DIAGNOSIS — K219 Gastro-esophageal reflux disease without esophagitis: Secondary | ICD-10-CM

## 2016-02-22 DIAGNOSIS — Z86010 Personal history of colonic polyps: Secondary | ICD-10-CM

## 2016-02-22 DIAGNOSIS — M109 Gout, unspecified: Secondary | ICD-10-CM

## 2016-02-22 LAB — BASIC METABOLIC PANEL
BUN: 9 mg/dL (ref 6–23)
CO2: 28 mEq/L (ref 19–32)
Calcium: 10 mg/dL (ref 8.4–10.5)
Chloride: 102 mEq/L (ref 96–112)
Creatinine, Ser: 0.91 mg/dL (ref 0.40–1.50)
GFR: 105.52 mL/min (ref >60.00)
Glucose, Bld: 116 mg/dL — ABNORMAL HIGH (ref 70–99)
Potassium: 3.8 mEq/L (ref 3.5–5.1)
Sodium: 140 mEq/L (ref 135–145)

## 2016-02-22 LAB — LIPID PANEL
Cholesterol: 236 mg/dL — ABNORMAL HIGH (ref 0–200)
HDL: 37.2 mg/dL — ABNORMAL LOW (ref >39.00)
LDL Cholesterol: 173 mg/dL — ABNORMAL HIGH (ref 0–99)
NonHDL: 199.04
Total CHOL/HDL Ratio: 6
Triglycerides: 132 mg/dL (ref 0.0–149.0)
VLDL: 26.4 mg/dL (ref 0.0–40.0)

## 2016-02-22 LAB — HEPATIC FUNCTION PANEL
ALT: 16 U/L (ref 0–53)
AST: 18 U/L (ref 0–37)
Albumin: 4.4 g/dL (ref 3.5–5.2)
Alkaline Phosphatase: 70 U/L (ref 39–117)
Bilirubin, Direct: 0.1 mg/dL (ref 0.0–0.3)
Total Bilirubin: 0.4 mg/dL (ref 0.2–1.2)
Total Protein: 7.9 g/dL (ref 6.0–8.3)

## 2016-02-22 LAB — HEMOGLOBIN A1C: Hgb A1c MFr Bld: 6.5 % (ref 4.6–6.5)

## 2016-02-22 LAB — MICROALBUMIN / CREATININE URINE RATIO
Creatinine,U: 298.3 mg/dL
Microalb Creat Ratio: 0.5 mg/g (ref 0.0–30.0)
Microalb, Ur: 1.4 mg/dL (ref 0.0–1.9)

## 2016-02-22 LAB — URIC ACID: Uric Acid, Serum: 7.7 mg/dL (ref 4.0–7.8)

## 2016-02-22 MED ORDER — PANTOPRAZOLE SODIUM 40 MG PO TBEC: 40.0000 mg | DELAYED_RELEASE_TABLET | Freq: Every day | ORAL | Status: DC

## 2016-02-22 NOTE — Patient Instructions (Signed)
Complete lab work prior to leaving. Stop pepcid, start protonix  For heartburn. Schedule a medicare wellness visit at the front desk.  Keep up the regular exercise and work on weight loss. You will be contacted about your referral to cardiology and for colonoscopy.  Welcome to [facility]!

## 2016-02-22 NOTE — Progress Notes (Signed)
Pre visit review using our clinic review tool, if applicable. No additional management support is needed unless otherwise documented below in the visit note. 

## 2016-02-22 NOTE — Progress Notes (Signed)
Subjective:    Patient ID: Steven Payne, male    DOB: December 17, 1945, 70 y.o.   MRN: [id number]  HPI  Mr.  Steven Payne is a 70 yr old male who presents today to establish care. Previously followed at [facility].    1) DM2- Pt reports that he was diagnosed 10-12 years ago.   Currently maintained on metformin. Has used tanzeum in the past but this caused severe constipation.  He checks his sugars 3-4 times a week.  Reports that his sugars usually hover around 100 in the AM.    2) HTN- reports that he has had HTN x 30 years.  On diovan HCT.  Has not taken meds this AM.    BP Readings from Last 3 Encounters:  02/22/16 146/86  01/01/16 136/76  11/08/15 139/93   3) ED- uses cialis. Reports this is effective.   4) Hyperlipidemia- pt is maintained on zetia.  Has tolerated statins in the past.    5) Gout- Has not had flare in years.    6) GERD- uncontrolled on pepcid.   Patient is maintained on plavix. Reports that he had a cardiac cath several years ago and placed on plavix at that time.  Was told that he has a "previous hx of heart attack."      Review of Systems  Constitutional:       Reports weight has been as high as 275 Goes to they gym 30 min a day  HENT: Negative for rhinorrhea.   Eyes: Negative for visual disturbance.  Respiratory: Negative for cough.   Cardiovascular: Negative for leg swelling.  Gastrointestinal: Negative for diarrhea, constipation and blood in stool.  Genitourinary: Negative for dysuria and frequency.  Musculoskeletal: Negative for myalgias and arthralgias.  Skin: Negative for rash.  Neurological: Negative for headaches.  Hematological: Negative for adenopathy.  Psychiatric/Behavioral:       Denies depression/anxiety   Past Medical History  Diagnosis Date  . Hypertension   . Diabetes mellitus without complication ([place])   . Hyperlipemia   . Gout     Social History   Social History  . Marital Status: Married    Spouse Name: N/A  . Number of Children: N/A   . Years of Education: N/A   Occupational History  . Not on file.   Social History Main Topics  . Smoking status: Former Research scientist (life sciences)  . Smokeless tobacco: Not on file  . Alcohol Use: No  . Drug Use: No  . Sexual Activity: No   Other Topics Concern  . 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  No grandchildren.  Oldest in [place], youngest in [place]   Completed bachelors, some graduate (Estate manager/land agent)   Enjoys travelling, walking, cruises    Past Surgical History  Procedure Laterality Date  . Appendectomy    . Hernia repair      Family History  Problem Relation Age of Onset  . Coronary artery disease Father   . Diabetes Father   . Prostate cancer Father     died at 14  . Coronary artery disease Mother     died age 70  . Alcohol abuse Mother   . Alcohol abuse Father   . Breast cancer Paternal Grandmother   . Cancer Mother     unsure, + thyroid cancer  Allergies  Allergen Reactions  . Tanzeum [Albiglutide] Other (See Comments)    constipation    Current Outpatient Prescriptions on File Prior to Visit  Medication Sig Dispense Refill  . clopidogrel (PLAVIX) 75 MG tablet Take 75 mg by mouth daily.    Marland Kitchen diltiazem (TIAZAC) 300 MG 24 hr capsule Take 300 mg by mouth daily.    . metFORMIN (GLUCOPHAGE) 1000 MG tablet Take 1,000 mg by mouth daily with breakfast.      No current facility-administered medications on file prior to visit.    BP 146/86 mmHg  Pulse 64  Temp(Src) 98.5 F (36.9 C) (Oral)  Resp 18  Ht 5\' 6"  (1.676 m)  Wt 278 lb 6.4 oz (126.281 kg)  BMI 44.96 kg/m2  SpO2 97%       Objective:   Physical Exam  Constitutional: He is oriented to person, place, and time. He appears well-developed and well-nourished. No distress.  HENT:  Head: Normocephalic and atraumatic.  Cardiovascular: Normal rate and  regular rhythm.   No murmur heard. Pulmonary/Chest: Effort normal and breath sounds normal. No respiratory distress. He has no wheezes. He has no rales.  Musculoskeletal: He exhibits no edema.  Neurological: He is alert and oriented to person, place, and time.  Skin: Skin is warm and dry.  Psychiatric: He has a normal mood and affect. His behavior is normal. Thought content normal.          Assessment & Plan:

## 2016-02-23 ENCOUNTER — Other Ambulatory Visit: Payer: Self-pay | Admitting: Family

## 2016-02-23 DIAGNOSIS — E119 Type 2 diabetes mellitus without complications: Secondary | ICD-10-CM

## 2016-02-23 DIAGNOSIS — M109 Gout, unspecified: Secondary | ICD-10-CM | POA: Insufficient documentation

## 2016-02-23 DIAGNOSIS — E785 Hyperlipidemia, unspecified: Secondary | ICD-10-CM

## 2016-02-23 DIAGNOSIS — Z62891 Sibling rivalry: Secondary | ICD-10-CM | POA: Diagnosis not present

## 2016-02-23 DIAGNOSIS — Z8739 Personal history of other diseases of the musculoskeletal system and connective tissue: Secondary | ICD-10-CM | POA: Insufficient documentation

## 2016-02-23 DIAGNOSIS — I10 Essential (primary) hypertension: Secondary | ICD-10-CM

## 2016-02-23 DIAGNOSIS — I25119 Atherosclerotic heart disease of native coronary artery with unspecified angina pectoris: Secondary | ICD-10-CM | POA: Insufficient documentation

## 2016-02-23 DIAGNOSIS — K219 Gastro-esophageal reflux disease without esophagitis: Secondary | ICD-10-CM | POA: Insufficient documentation

## 2016-02-23 DIAGNOSIS — F4310 Post-traumatic stress disorder, unspecified: Secondary | ICD-10-CM | POA: Diagnosis not present

## 2016-02-23 DIAGNOSIS — I2510 Atherosclerotic heart disease of native coronary artery without angina pectoris: Secondary | ICD-10-CM | POA: Insufficient documentation

## 2016-02-23 DIAGNOSIS — Z9149 Other personal history of psychological trauma, not elsewhere classified: Secondary | ICD-10-CM | POA: Diagnosis not present

## 2016-02-23 HISTORY — DX: Atherosclerotic heart disease of native coronary artery with unspecified angina pectoris: I25.119

## 2016-02-23 HISTORY — DX: Gout, unspecified: M10.9

## 2016-02-23 HISTORY — DX: Hyperlipidemia, unspecified: E78.5

## 2016-02-23 HISTORY — DX: Type 2 diabetes mellitus without complications: E11.9

## 2016-02-23 HISTORY — DX: Essential (primary) hypertension: I10

## 2016-02-23 NOTE — Assessment & Plan Note (Signed)
Clinically stable.  Obtain A1C, continue metformin.  Obtain urine microalbumin.

## 2016-02-23 NOTE — Assessment & Plan Note (Signed)
Uncontrolled on pepcid, d/c pepcid, start PPI

## 2016-02-23 NOTE — Telephone Encounter (Signed)
Cholesterol is high.  Lets have him d/c zetia, start atorvastatin. Sugar is at goal. Liver function and uric acid (gout) testing look good.

## 2016-02-23 NOTE — Assessment & Plan Note (Signed)
Clinically stable. Continue plavix, refer to cardiology.

## 2016-02-23 NOTE — Assessment & Plan Note (Signed)
BP up today, did not take meds.  Resume.

## 2016-02-23 NOTE — Assessment & Plan Note (Signed)
Obtain lipid panel. Will likely add statin.

## 2016-02-24 NOTE — Telephone Encounter (Signed)
Left message for pt to return my call.

## 2016-02-27 ENCOUNTER — Telehealth: Payer: Self-pay | Admitting: Family

## 2016-02-27 MED ORDER — ATORVASTATIN CALCIUM 40 MG PO TABS: 40.0000 mg | ORAL_TABLET | Freq: Every day | ORAL | Status: DC

## 2016-02-27 NOTE — Telephone Encounter (Signed)
Attempted to reach pt and left message to check mychart message.

## 2016-02-27 NOTE — Telephone Encounter (Signed)
Called left message to call back 

## 2016-02-27 NOTE — Telephone Encounter (Signed)
Caller name: Self Relationship to patient: Can be reached: [phone] Pharmacy:  Reason for call: Would like to have lab results called to him

## 2016-02-27 NOTE — Telephone Encounter (Signed)
See 02/23/16 refill note. Spoke with pt, results not released to mychart yet. Manual release performed. Pt aware.

## 2016-03-02 ENCOUNTER — Telehealth: Payer: Self-pay | Admitting: *Deleted

## 2016-03-02 MED ORDER — PANTOPRAZOLE SODIUM 40 MG PO TBEC: 40.0000 mg | DELAYED_RELEASE_TABLET | Freq: Every day | ORAL | Status: DC

## 2016-03-02 MED ORDER — ATORVASTATIN CALCIUM 40 MG PO TABS: 40.0000 mg | ORAL_TABLET | Freq: Every day | ORAL | Status: DC

## 2016-03-02 NOTE — Telephone Encounter (Signed)
Received fax from [facility] for atorvastatin and pantoprazole. REfills sent.

## 2016-03-12 ENCOUNTER — Ambulatory Visit: Payer: Medicare Other

## 2016-03-22 DIAGNOSIS — Z9149 Other personal history of psychological trauma, not elsewhere classified: Secondary | ICD-10-CM | POA: Diagnosis not present

## 2016-03-22 DIAGNOSIS — F4310 Post-traumatic stress disorder, unspecified: Secondary | ICD-10-CM | POA: Diagnosis not present

## 2016-03-22 DIAGNOSIS — Z62891 Sibling rivalry: Secondary | ICD-10-CM | POA: Diagnosis not present

## 2016-03-26 ENCOUNTER — Ambulatory Visit (INDEPENDENT_AMBULATORY_CARE_PROVIDER_SITE_OTHER): Payer: Medicare Other

## 2016-03-26 VITALS — BP 135/75 | HR 65 | Ht 67.0 in | Wt 280.6 lb

## 2016-03-26 DIAGNOSIS — Z0000 Encounter for general adult medical examination without abnormal findings: Secondary | ICD-10-CM | POA: Diagnosis not present

## 2016-03-26 NOTE — Progress Notes (Signed)
Pre visit review using our clinic review tool, if applicable. No additional management support is needed unless otherwise documented below in the visit note. 

## 2016-03-26 NOTE — Progress Notes (Signed)
Subjective:   Steven Payne is a 70 y.o. male who presents for an Initial Medicare Annual Wellness Visit.  Review of Systems: No ROS Cardiac Risk Factors include: advanced age (>66men, >48 women);male gender;hypertension;diabetes mellitus;obesity (BMI >30kg/m2) (Hyperlipidemia)  Sleep patterns: Sleeps at least 8 hours per night/wakes up at least 1-2 times per night to void.     Home Safety/Smoke Alarms: Feels safe at home.  Lives with wife in one level home.  Smoke alarms and security system present.   Firearm Safety: No firearms.   Seat Belt Safety/Bike Helmet:  Always wears seat belt.    Counseling: Eye Exam- 1 year ago.  Dental-  Plans to schedule an appt.   Male:  [facility]-5 years ago-[facility]; pt reports removal of 2 polyps; copy of records requested  PSA- Not noted in chart    Objective:    Today's Vitals   03/26/16 0900  BP: 135/75  Pulse: 65  Height: 5\' 7"  (1.702 m)  Weight: 280 lb 9.6 oz (127.279 kg)  SpO2: 97%  PainSc: 0-No pain   Body mass index is 43.94 kg/(m^2).  Current Medications (verified) Outpatient Encounter Prescriptions as of 03/26/2016  Medication Sig  . atorvastatin (LIPITOR) 40 MG tablet Take 1 tablet (40 mg total) by mouth daily.  . clopidogrel (PLAVIX) 75 MG tablet Take 75 mg by mouth daily.  Marland Kitchen diltiazem (TIAZAC) 300 MG 24 hr capsule Take 300 mg by mouth daily.  . metFORMIN (GLUCOPHAGE) 1000 MG tablet Take 1,000 mg by mouth daily with breakfast.   . pantoprazole (PROTONIX) 40 MG tablet Take 1 tablet (40 mg total) by mouth daily.  . tadalafil (CIALIS) 10 MG tablet Take 10 mg by mouth as needed for erectile dysfunction.  . valsartan-hydrochlorothiazide (DIOVAN-HCT) 160-25 MG tablet Take 1 tablet by mouth daily.   No facility-administered encounter medications on file as of 03/26/2016.    Allergies (verified) Tanzeum   History: Past Medical History  Diagnosis Date  . Hypertension   . Diabetes mellitus without complication ([place])   .  Hyperlipemia   . Gout    Past Surgical History  Procedure Laterality Date  . Appendectomy    . Hernia repair     Family History  Problem Relation Age of Onset  . Coronary artery disease Father   . Diabetes Father   . Prostate cancer Father     died at 34  . Coronary artery disease Mother     died age 73  . Alcohol abuse Mother   . Alcohol abuse Father   . Breast cancer Paternal Grandmother   . Cancer Mother     unsure, + thyroid cancer   Social History   Occupational History  . Not on file.   Social History Main Topics  . Smoking status: Former Smoker    Quit date: 03/27/1991  . Smokeless tobacco: Not on file  . Alcohol Use: 0.0 oz/week    0 Standard drinks or equivalent per week     Comment: Very rarely  . Drug Use: No  . Sexual Activity: No   Tobacco Counseling Counseling given: No   Activities of Daily Living In your present state of health, do you have any difficulty performing the following activities: 03/26/2016  Hearing? N  Vision? N  Difficulty concentrating or making decisions? N  Walking or climbing stairs? N  Dressing or bathing? N  Doing errands, shopping? N  Preparing Food and eating ? N  Using the Toilet? N  In the  past six months, have you accidently leaked urine? N  Do you have problems with loss of bowel control? N  Managing your Medications? N  Managing your Finances? N  Housekeeping or managing your Housekeeping? N    Immunizations and Health Maintenance  There is no immunization history on file for this patient. Health Maintenance Due  Topic Date Due  . Hepatitis C Screening  Mar 16, 1946  . FOOT EXAM  10/15/1954  . OPHTHALMOLOGY EXAM  10/15/1954  . TETANUS/TDAP  10/16/1963  . COLONOSCOPY  10/15/1994  . ZOSTAVAX  10/15/2004  . PNA vac Low Risk Adult (1 of 2 - PCV13) 10/15/2009    Patient Care Team: [name], NP as PCP - General (Internal Medicine)  Referrals have been placed cardiology and gastroenterology    Indicate any recent Medical Services you may have received from other than [facility] providers in the past year (date may be approximate).    Assessment:   This is a routine wellness examination for Steven Payne.   Hearing/Vision screen  Hearing Screening   125Hz  250Hz  500Hz  1000Hz  2000Hz  4000Hz  8000Hz   Right ear:   Pass Pass Pass Pass   Left ear:   Pass Pass Pass Pass   Comments: No hearing problems.    Vision Screening Comments: Last eye exam: 1 year ago; Engineer, mining glasses  Dietary issues and exercise activities discussed: Current Exercise Habits: Structured exercise class (Goes to [employer]), Type of exercise: treadmill;strength training/weights, Time (Minutes): 50, Frequency (Times/Week): 3, Weekly Exercise (Minutes/Week): 150   Diet:  Regular diet.  Eats at least 2 meals per day.  Mostly eats fast foods.     Goals    . Increase lean proteins    . Increase physical activity    . Increase water intake      Depression Screen PHQ 2/9 Scores 03/26/2016  PHQ - 2 Score 0    Fall Risk Fall Risk  03/26/2016  Falls in the past year? No    Cognitive Function: MMSE - Mini Mental State Exam 03/26/2016  Orientation to time 5  Orientation to Place 5  Registration 3  Attention/ Calculation 5  Recall 2  Language- name 2 objects 2  Language- repeat 1  Language- follow 3 step command 3  Language- read & follow direction 1  Write a sentence 1  Copy design 1  Total score 29    Screening Tests Health Maintenance  Topic Date Due  . Hepatitis C Screening  [birth year]  . FOOT EXAM  10/15/1954  . OPHTHALMOLOGY EXAM  10/15/1954  . TETANUS/TDAP  10/16/1963  . COLONOSCOPY  10/15/1994  . ZOSTAVAX  10/15/2004  . PNA vac Low Risk Adult (1 of 2 - PCV13) 10/15/2009  . INFLUENZA VACCINE  07/03/2016  . HEMOGLOBIN A1C  08/24/2016        Plan:  Continue doing brain stimulating activities (puzzles, reading, adult coloring books, staying active) to keep memory sharp.    Eat a healthy diet (full of fruits, vegetables, whole grains, lean protein, water--limit salt, fat, and sugar intake) and increase physical activity as tolerated.  Choose grilled, baked or broiled meat rather fried.  Drink plenty water.  Please obtain colonoscopy records.  Follow up with Melissa as scheduled.     During the course of the visit Nadeem was educated and counseled about the following appropriate screening and preventive services:   Vaccines to include Pneumoccal, Influenza, Hepatitis B, Td, Zostavax, HCV  Electrocardiogram  Colorectal cancer screening  Cardiovascular disease screening  Diabetes screening  Glaucoma screening  Nutrition counseling  Prostate cancer screening  Smoking cessation counseling  Patient Instructions (the written plan) were given to the patient.   Rudene Anda, RN   03/26/2016

## 2016-03-26 NOTE — Patient Instructions (Addendum)
Continue doing brain stimulating activities (puzzles, reading, adult coloring books, staying active) to keep memory sharp.   Eat a healthy diet (full of fruits, vegetables, whole grains, lean protein, water--limit salt, fat, and sugar intake) and increase physical activity as tolerated.  Choose grilled, baked or broiled meat rather than fried.  Drink plenty water.  Please obtain colonoscopy records.  Follow up with Melissa as scheduled.     Fat and Cholesterol Restricted Diet High levels of fat and cholesterol in your blood may lead to various health problems, such as diseases of the heart, blood vessels, gallbladder, liver, and pancreas. Fats are concentrated sources of energy that come in various forms. Certain types of fat, including saturated fat, may be harmful in excess. Cholesterol is a substance needed by your body in small amounts. Your body makes all the cholesterol it needs. Excess cholesterol comes from the food you eat. When you have high levels of cholesterol and saturated fat in your blood, health problems can develop because the excess fat and cholesterol will gather along the walls of your blood vessels, causing them to narrow. Choosing the right foods will help you control your intake of fat and cholesterol. This will help keep the levels of these substances in your blood within normal limits and reduce your risk of disease. WHAT IS MY PLAN? Your health care provider recommends that you:  Get no more than __________ % of the total calories in your daily diet from fat.  Limit your intake of saturated fat to less than ______% of your total calories each day.  Limit the amount of cholesterol in your diet to less than _________mg per day. WHAT TYPES OF FAT SHOULD I CHOOSE?  Choose healthy fats more often. Choose monounsaturated and polyunsaturated fats, such as olive and canola oil, flaxseeds, walnuts, almonds, and seeds.  Eat more omega-3 fats. Good choices include salmon,  mackerel, sardines, tuna, flaxseed oil, and ground flaxseeds. Aim to eat fish at least two times a week.  Limit saturated fats. Saturated fats are primarily found in animal products, such as meats, butter, and cream. Plant sources of saturated fats include palm oil, palm kernel oil, and coconut oil.  Avoid foods with partially hydrogenated oils in them. These contain trans fats. Examples of foods that contain trans fats are stick margarine, some tub margarines, cookies, crackers, and other baked goods. WHAT GENERAL GUIDELINES DO I NEED TO FOLLOW? These guidelines for healthy eating will help you control your intake of fat and cholesterol:  Check food labels carefully to identify foods with trans fats or high amounts of saturated fat.  Fill one half of your plate with vegetables and green salads.  Fill one fourth of your plate with whole grains. Look for the word "whole" as the first word in the ingredient list.  Fill one fourth of your plate with lean protein foods.  Limit fruit to two servings a day. Choose fruit instead of juice.  Eat more foods that contain soluble fiber. Examples of foods that contain this type of fiber are apples, broccoli, carrots, beans, peas, and barley. Aim to get 20-30 g of fiber per day.  Eat more home-cooked food and less restaurant, buffet, and fast food.  Limit or avoid alcohol.  Limit foods high in starch and sugar.  Limit fried foods.  Cook foods using methods other than frying. Baking, boiling, grilling, and broiling are all great options.  Lose weight if you are overweight. Losing just 5-10% of your initial body weight  can help your overall health and prevent diseases such as diabetes and heart disease. WHAT FOODS CAN I EAT? Grains Whole grains, such as whole wheat or whole grain breads, crackers, cereals, and pasta. Unsweetened oatmeal, bulgur, barley, quinoa, or brown rice. Corn or whole wheat flour tortillas. Vegetables Fresh or frozen  vegetables (raw, steamed, roasted, or grilled). Green salads. Fruits All fresh, canned (in natural juice), or frozen fruits. Meat and Other Protein Products Ground beef (85% or leaner), grass-fed beef, or beef trimmed of fat. Skinless chicken or [place]. Ground chicken or [place]. Pork trimmed of fat. All fish and seafood. Eggs. Dried beans, peas, or lentils. Unsalted nuts or seeds. Unsalted canned or dry beans. Dairy Low-fat dairy products, such as skim or 1% milk, 2% or reduced-fat cheeses, low-fat ricotta or cottage cheese, or plain low-fat yogurt. Fats and Oils Tub margarines without trans fats. Light or reduced-fat mayonnaise and salad dressings. Avocado. Olive, canola, sesame, or safflower oils. Natural peanut or almond butter (choose ones without added sugar and oil). The items listed above may not be a complete list of recommended foods or beverages. Contact your dietitian for more options. WHAT FOODS ARE NOT RECOMMENDED? Grains White bread. White pasta. White rice. Cornbread. Bagels, pastries, and croissants. Crackers that contain trans fat. Vegetables White potatoes. Corn. Creamed or fried vegetables. Vegetables in a cheese sauce. Fruits Dried fruits. Canned fruit in light or heavy syrup. Fruit juice. Meat and Other Protein Products Fatty cuts of meat. Ribs, chicken wings, bacon, sausage, bologna, salami, chitterlings, fatback, hot dogs, bratwurst, and packaged luncheon meats. Liver and organ meats. Dairy Whole or 2% milk, cream, half-and-half, and cream cheese. Whole milk cheeses. Whole-fat or sweetened yogurt. Full-fat cheeses. Nondairy creamers and whipped toppings. Processed cheese, cheese spreads, or cheese curds. Sweets and Desserts Corn syrup, sugars, honey, and molasses. Candy. Jam and jelly. Syrup. Sweetened cereals. Cookies, pies, cakes, donuts, muffins, and ice cream. Fats and Oils Butter, stick margarine, lard, shortening, ghee, or bacon fat. Coconut, palm kernel, or palm  oils. Beverages Alcohol. Sweetened drinks (such as sodas, lemonade, and fruit drinks or punches). The items listed above may not be a complete list of foods and beverages to avoid. Contact your dietitian for more information.   This information is not intended to replace advice given to you by your health care provider. Make sure you discuss any questions you have with your health care provider.   Document Released: 11/19/2005 Document Revised: 12/10/2014 Document Reviewed: 02/17/2014 Elsevier Interactive Patient Education 2016 [place].  Diabetes and Foot Care Diabetes may cause you to have problems because of poor blood supply (circulation) to your feet and legs. This may cause the skin on your feet to become thinner, break easier, and heal more slowly. Your skin may become dry, and the skin may peel and crack. You may also have nerve damage in your legs and feet causing decreased feeling in them. You may not notice minor injuries to your feet that could lead to infections or more serious problems. Taking care of your feet is one of the most important things you can do for yourself.  HOME CARE INSTRUCTIONS  Wear shoes at all times, even in the house. Do not go barefoot. Bare feet are easily injured.  Check your feet daily for blisters, cuts, and redness. If you cannot see the bottom of your feet, use a mirror or ask someone for help.  Wash your feet with warm water (do not use hot water) and mild soap. Then  pat your feet and the areas between your toes until they are completely dry. Do not soak your feet as this can dry your skin.  Apply a moisturizing lotion or petroleum jelly (that does not contain alcohol and is unscented) to the skin on your feet and to dry, brittle toenails. Do not apply lotion between your toes.  Trim your toenails straight across. Do not dig under them or around the cuticle. File the edges of your nails with an emery board or nail file.  Do not cut corns or  calluses or try to remove them with medicine.  Wear clean socks or stockings every day. Make sure they are not too tight. Do not wear knee-high stockings since they may decrease blood flow to your legs.  Wear shoes that fit properly and have enough cushioning. To break in new shoes, wear them for just a few hours a day. This prevents you from injuring your feet. Always look in your shoes before you put them on to be sure there are no objects inside.  Do not cross your legs. This may decrease the blood flow to your feet.  If you find a minor scrape, cut, or break in the skin on your feet, keep it and the skin around it clean and dry. These areas may be cleansed with mild soap and water. Do not cleanse the area with peroxide, alcohol, or iodine.  When you remove an adhesive bandage, be sure not to damage the skin around it.  If you have a wound, look at it several times a day to make sure it is healing.  Do not use heating pads or hot water bottles. They may burn your skin. If you have lost feeling in your feet or legs, you may not know it is happening until it is too late.  Make sure your health care provider performs a complete foot exam at least annually or more often if you have foot problems. Report any cuts, sores, or bruises to your health care provider immediately. SEEK MEDICAL CARE IF:   You have an injury that is not healing.  You have cuts or breaks in the skin.  You have an ingrown nail.  You notice redness on your legs or feet.  You feel burning or tingling in your legs or feet.  You have pain or cramps in your legs and feet.  Your legs or feet are numb.  Your feet always feel cold. SEEK IMMEDIATE MEDICAL CARE IF:   There is increasing redness, swelling, or pain in or around a wound.  There is a red line that goes up your leg.  Pus is coming from a wound.  You develop a fever or as directed by your health care provider.  You notice a bad smell coming from an  ulcer or wound.   This information is not intended to replace advice given to you by your health care provider. Make sure you discuss any questions you have with your health care provider.   Document Released: 11/16/2000 Document Revised: 07/22/2013 Document Reviewed: 04/28/2013 Elsevier Interactive Patient Education 2016 [place] Maintenance, Male A healthy lifestyle and preventative care can promote health and wellness.  Maintain regular health, dental, and eye exams.  Eat a healthy diet. Foods like vegetables, fruits, whole grains, low-fat dairy products, and lean protein foods contain the nutrients you need and are low in calories. Decrease your intake of foods high in solid fats, added sugars, and salt. Get information about  a proper diet from your health care provider, if necessary.  Regular physical exercise is one of the most important things you can do for your health. Most adults should get at least 150 minutes of moderate-intensity exercise (any activity that increases your heart rate and causes you to sweat) each week. In addition, most adults need muscle-strengthening exercises on 2 or more days a week.   Maintain a healthy weight. The body mass index (BMI) is a screening tool to identify possible weight problems. It provides an estimate of body fat based on height and weight. Your health care provider can find your BMI and can help you achieve or maintain a healthy weight. For males 20 years and older:  A BMI below 18.5 is considered underweight.  A BMI of 18.5 to 24.9 is normal.  A BMI of 25 to 29.9 is considered overweight.  A BMI of 30 and above is considered obese.  Maintain normal blood lipids and cholesterol by exercising and minimizing your intake of saturated fat. Eat a balanced diet with plenty of fruits and vegetables. Blood tests for lipids and cholesterol should begin at age 27 and be repeated every 5 years. If your lipid or cholesterol levels are  high, you are over age 54, or you are at high risk for heart disease, you may need your cholesterol levels checked more frequently.Ongoing high lipid and cholesterol levels should be treated with medicines if diet and exercise are not working.  If you smoke, find out from your health care provider how to quit. If you do not use tobacco, do not start.  Lung cancer screening is recommended for adults aged 38-80 years who are at high risk for developing lung cancer because of a history of smoking. A yearly low-dose CT scan of the lungs is recommended for people who have at least a 30-pack-year history of smoking and are current smokers or have quit within the past 15 years. A pack year of smoking is smoking an average of 1 pack of cigarettes a day for 1 year (for example, a 30-pack-year history of smoking could mean smoking 1 pack a day for 30 years or 2 packs a day for 15 years). Yearly screening should continue until the smoker has stopped smoking for at least 15 years. Yearly screening should be stopped for people who develop a health problem that would prevent them from having lung cancer treatment.  If you choose to drink alcohol, do not have more than 2 drinks per day. One drink is considered to be 12 oz (360 mL) of beer, 5 oz (150 mL) of wine, or 1.5 oz (45 mL) of liquor.  Avoid the use of street drugs. Do not share needles with anyone. Ask for help if you need support or instructions about stopping the use of drugs.  High blood pressure causes heart disease and increases the risk of stroke. High blood pressure is more likely to develop in:  People who have blood pressure in the end of the normal range (100-139/85-89 mm Hg).  People who are overweight or obese.  People who are African American.  If you are 65-5 years of age, have your blood pressure checked every 3-5 years. If you are 57 years of age or older, have your blood pressure checked every year. You should have your blood pressure  measured twice--once when you are at a hospital or clinic, and once when you are not at a hospital or clinic. Record the average of the two measurements.  To check your blood pressure when you are not at a hospital or clinic, you can use:  An automated blood pressure machine at a pharmacy.  A home blood pressure monitor.  If you are 36-58 years old, ask your health care provider if you should take aspirin to prevent heart disease.  Diabetes screening involves taking a blood sample to check your fasting blood sugar level. This should be done once every 3 years after age 57 if you are at a normal weight and without risk factors for diabetes. Testing should be considered at a younger age or be carried out more frequently if you are overweight and have at least 1 risk factor for diabetes.  Colorectal cancer can be detected and often prevented. Most routine colorectal cancer screening begins at the age of 62 and continues through age 85. However, your health care provider may recommend screening at an earlier age if you have risk factors for colon cancer. On a yearly basis, your health care provider may provide home test kits to check for hidden blood in the stool. A small camera at the end of a tube may be used to directly examine the colon (sigmoidoscopy or colonoscopy) to detect the earliest forms of colorectal cancer. Talk to your health care provider about this at age 75 when routine screening begins. A direct exam of the colon should be repeated every 5-10 years through age 57, unless early forms of precancerous polyps or small growths are found.  People who are at an increased risk for hepatitis B should be screened for this virus. You are considered at high risk for hepatitis B if:  You were born in a country where hepatitis B occurs often. Talk with your health care provider about which countries are considered high risk.  Your parents were born in a high-risk country and you have not received a  shot to protect against hepatitis B (hepatitis B vaccine).  You have HIV or AIDS.  You use needles to inject street drugs.  You live with, or have sex with, someone who has hepatitis B.  You are a man who has sex with other men (MSM).  You get hemodialysis treatment.  You take certain medicines for conditions like cancer, organ transplantation, and autoimmune conditions.  Hepatitis C blood testing is recommended for all people born from [birth year] through 1967 and any individual with known risk factors for hepatitis C.  Healthy men should no longer receive prostate-specific antigen (PSA) blood tests as part of routine cancer screening. Talk to your health care provider about prostate cancer screening.  Testicular cancer screening is not recommended for adolescents or adult males who have no symptoms. Screening includes self-exam, a health care provider exam, and other screening tests. Consult with your health care provider about any symptoms you have or any concerns you have about testicular cancer.  Practice safe sex. Use condoms and avoid high-risk sexual practices to reduce the spread of sexually transmitted infections (STIs).  You should be screened for STIs, including gonorrhea and chlamydia if:  You are sexually active and are younger than 24 years.  You are older than 24 years, and your health care provider tells you that you are at risk for this type of infection.  Your sexual activity has changed since you were last screened, and you are at an increased risk for chlamydia or gonorrhea. Ask your health care provider if you are at risk.  If you are at risk of being infected with HIV, it  is recommended that you take a prescription medicine daily to prevent HIV infection. This is called pre-exposure prophylaxis (PrEP). You are considered at risk if:  You are a man who has sex with other men (MSM).  You are a heterosexual man who is sexually active with multiple partners.  You take  drugs by injection.  You are sexually active with a partner who has HIV.  Talk with your health care provider about whether you are at high risk of being infected with HIV. If you choose to begin PrEP, you should first be tested for HIV. You should then be tested every 3 months for as long as you are taking PrEP.  Use sunscreen. Apply sunscreen liberally and repeatedly throughout the day. You should seek shade when your shadow is shorter than you. Protect yourself by wearing long sleeves, pants, a wide-brimmed hat, and sunglasses year round whenever you are outdoors.  Tell your health care provider of new moles or changes in moles, especially if there is a change in shape or color. Also, tell your health care provider if a mole is larger than the size of a pencil eraser.  A one-time screening for abdominal aortic aneurysm (AAA) and surgical repair of large AAAs by ultrasound is recommended for men aged 77-75 years who are current or former smokers.  Stay current with your vaccines (immunizations).   This information is not intended to replace advice given to you by your health care provider. Make sure you discuss any questions you have with your health care provider.   Document Released: 05/17/2008 Document Revised: 12/10/2014 Document Reviewed: 04/16/2011 Elsevier Interactive Patient Education [employer].

## 2016-03-26 NOTE — Progress Notes (Signed)
Noted and agree. 

## 2016-04-12 ENCOUNTER — Telehealth: Payer: Self-pay | Admitting: Family

## 2016-04-12 NOTE — Telephone Encounter (Signed)
Returned patient's call.  Left a message for call back.

## 2016-04-12 NOTE — Telephone Encounter (Signed)
Can be reached: [phone]   Reason for call: Pt states returning call to you about a referral program?

## 2016-04-12 NOTE — Telephone Encounter (Addendum)
Verbal consent given to enroll into the Chronic Care Management program.     CHRONIC CARE MANAGEMENT CARE PLAN  Medication Reconciliation- Review over medications monthly.  Update any changes to medications.  Assess for any new side effects or intolerances to medications.  Provide any necessary refills.  Goal-Maintain updated medication list in chart at all times-----Medications reconciled.    Medication adherence- Assess whether medications are being taken as prescribed.  Provide any necessary education for new medications or changes in medication regimen.   Goal- Will maintain 95% medication adherence---Reported taking all medications as prescribed.  No refills needed at this time.    Functional Status- Assess for ability to complete activities of daily living.  If any changes in patient's functional status are in questions, complete the functional status questionnaire.  Goal-Maintains ability to perform activities of daily living.  If identification of assistance or occupational therapy is needy, place referral.----Able to perform ADLs independently without assistance.    Psychosocial -Assess for any changes in cognition or behavior.  Any changes to memory or focus?  Any new life stressors? Difficulty sleeping?  Changes in mood?  Goal-early identification, treatment, and resolution of any psychological co-morbidities that could occur.---Alert and oriented x 3.  No memory or focus issues.  No new life stressors voiced.  Sleeps at least 8 hours per night/wakes up at least 1-2 times per night to void.  No changes in mood.    Lifestyle -Assess for any changes in lifestyle.  Maintain a healthy balance of sleep, diet, exercise, work, and social life.   Goal-Identify need for any potential support groups that could be of benefit to patient (see mental health resource book).----Pt would like to come into office to meet with nurse about diet and exercise.  Appt to be scheduled at later date.     Appointment Adherence-Assess for any barriers to making appointments.  Goal-100% attendance to scheduled appointments.---Pt has a follow up appointment with PCP on 06/25/16 @ 7 am.  He plans to keep appointment as scheduled.    Health Maintenance- Assess for needed immunizations and age related screenings.      Goal- Assist with scheduling all necessary screenings and immunizations.  Pt has a follow up appt on 06/25/16.  Pt agreed to obtain previous colonoscopy records.  Advanced Directives-Are advanced directives documented in EHR? Assess for readiness to discuss advanced directives Goal- Assist with completion and appropriate documentation of Advanced Directives in EHR.---Pt declined.    Care Plan reviewed with patient.  Agreed with above.    Verbalizes understanding that Chronic Care Management services may be terminated at any time.    Total time spent: 14:26

## 2016-05-02 NOTE — Telephone Encounter (Signed)
Called to follow up with patient.  Left a message for call back.   

## 2016-05-04 NOTE — Telephone Encounter (Signed)
Pre Visit Call. 

## 2016-05-17 ENCOUNTER — Ambulatory Visit: Payer: Medicare Other

## 2016-05-17 ENCOUNTER — Ambulatory Visit (INDEPENDENT_AMBULATORY_CARE_PROVIDER_SITE_OTHER): Payer: Medicare Other

## 2016-05-17 DIAGNOSIS — E119 Type 2 diabetes mellitus without complications: Secondary | ICD-10-CM

## 2016-05-21 NOTE — Progress Notes (Signed)
Patient has verbally agreed to participate in Chronic Care Management at this time. Patient is aware of the fee associated with the program and understands that he/she may terminate services at any time.  Program introduced: yes, pt agreed to enroll in program on 04/12/16.  Last OV: 03/26/16--Initial Medicare Annual Wellness Visit Next appt: 06/25/16- Follow up visit with [name], NP   Medications reviewed/reconciled: yes Medication adherence: yes Side effects/intolerances reviewed: yes, none voiced. Refills needed:no Relevant labs reviewed: yes  Sleep Patterns: Sleeps at least 8 hours per night/wakes up at least 1-2 times per night to void. Changes in memory: no New life stressors: no Changes in mood: no  Health Maintenance:  Requested records for last eye exam, colonoscopy, and immunizations.    Health Logs:  None  Activities of Daily Living (ADLs):   He states they are independent in the following: ambulation, bathing and hygiene, feeding, continence, grooming, toileting and dressing States they require assistance with the following: None  Care Teams:   Patient Care Team: [name], NP as PCP - General (Internal Medicine)  Patient Active Problem List   Diagnosis Date Noted  . Diabetes type 2, controlled ([place]) 02/23/2016  . HTN (hypertension) 02/23/2016  . Hyperlipidemia 02/23/2016  . Gout 02/23/2016  . GERD (gastroesophageal reflux disease) 02/23/2016  . CAD (coronary artery disease) 02/23/2016     Goals established:  Goals    . Increase lean proteins     Decrease intake of fried foods    . Increase physical activity     Has membership at [employer].  Pt has a personal plan to workout 3-4 days per week for at least  150 minutes/week.       . Increase water intake    . Lose 50 lbs by 05/17/17       Barriers identified: none    Any patient concerns?: yes, Pt would like recommendations for a dentist.    Care Plan:  Diabetes type 2,  controlled Hgb: A1C:  6.5 Taking Metformin as prescribed.  Diabetic teaching provided.  Topics include the follow: 1.  How to make healthy foods choices. 2.  Alternative food choices--switching white carbs for browns carbs.   3.  How to read food labels. 4.  The importance of drinking water. 5.  The importance of exercise. 6.  Weight loss goals.   7.  Taking medication as prescribed.   8.  How to differentiate hypoglycemia and hyperglycemia 9.  The importance of check blood sugars. 10.  When to call the provider.   Exercise encouraged.  Pt has a Higher education careers adviser at [employer], but does not currently go.   Pt's wife goes regularly.  He was encouraged to go to workout with his wife and make it a "couple thing."  Pt expressed plans to start going to the gym regularly.   Goal:  Continue to adhere to prescribed regimen.  Eat a healthy diet and exercise.  Maintain Hgb A1c less than 7%.  Lose 50lbs by 05/17/17.    HTN Taking Valsartan-HCTZ and diltiazem as prescribed. Goal: Maintain blood pressure less than 140/90. Eat a healthy diet (watching salt intake) and exercise.    GERD Taking pantoprazole as prescribed.  States GERD symptoms are much better.    Goal: Continue to adhere to prescribed regimen.  Eat a healthy diet and exercise.  Call office if symptoms fail to improve.     Hyperlipidemia Taking lipitor and plavix as prescribed. Cholesterol levels elevated. Pt aware of lab  results.   Encouraged heart health diet and exercise. Goal:  Continue to adhere to prescribed regimen.  Heart healthy diet (avoiding trans/saturated fats) and exercise recommended.  Call office with any concerns.    CAD Taking cardiac meds as prescribed. Goal:  Continue to adhere to prescribed regimen.  Call office with any concerns.     Gout Asymptomatic at this time. Goal:  Notify PCP of any changes.     Time Spent:  45 minutes  Rudene Anda, RN

## 2016-05-30 NOTE — Patient Instructions (Addendum)
Care Plan:  Diabetes type 2, controlled Hgb: A1C:  6.5 Taking Metformin as prescribed.  Diabetic teaching provided.  Topics include the follow: 1.  How to make healthy foods choices. 2.  Alternative food choices--switching white carbs for browns carbs.   3.  How to read food labels. 4.  The importance of drinking water. 5.  The importance of exercise. 6.  Weight loss goals.   7.  Taking medication as prescribed.   8.  How to differentiate hypoglycemia and hyperglycemia 9.  The importance of check blood sugars. 10.  When to call the provider.   Exercise encouraged.  Pt has a Higher education careers adviser at [employer], but does not currently go.   Pt's wife goes regularly.  He was encouraged to go to workout with his wife and make it a "couple thing."  Pt expressed plans to start going to the gym regularly.   Goal:  Continue to adhere to prescribed regimen.  Eat a healthy diet and exercise.  Maintain Hgb A1c less than 7%.  Lose 50lbs by 05/17/17.    HTN Taking Valsartan-HCTZ and diltiazem as prescribed. Goal: Maintain blood pressure less than 140/90. Eat a healthy diet (watching salt intake) and exercise.    GERD Taking pantoprazole as prescribed.  States GERD symptoms are much better.    Goal: Continue to adhere to prescribed regimen.  Eat a healthy diet and exercise.  Call office if symptoms fail to improve.     Hyperlipidemia Taking lipitor and plavix as prescribed. Cholesterol levels elevated. Pt aware of lab results.   Encouraged heart health diet and exercise. Goal:  Continue to adhere to prescribed regimen.  Heart healthy diet (avoiding trans/saturated fats) and exercise recommended.  Call office with any concerns.    CAD Taking cardiac meds as prescribed. Goal:  Continue to adhere to prescribed regimen.  Call office with any concerns.     Gout Asymptomatic at this time. Goal:  Notify PCP of any changes.

## 2016-05-30 NOTE — Progress Notes (Signed)
Noted  

## 2016-06-25 ENCOUNTER — Ambulatory Visit (INDEPENDENT_AMBULATORY_CARE_PROVIDER_SITE_OTHER): Payer: Medicare Other | Admitting: Family

## 2016-06-25 ENCOUNTER — Encounter: Payer: Self-pay | Admitting: *Deleted

## 2016-06-25 ENCOUNTER — Encounter: Payer: Self-pay | Admitting: Family

## 2016-06-25 ENCOUNTER — Other Ambulatory Visit: Payer: Self-pay | Admitting: Family

## 2016-06-25 VITALS — BP 138/88 | HR 64 | Temp 97.8°F | Ht 66.0 in | Wt 277.0 lb

## 2016-06-25 DIAGNOSIS — N529 Male erectile dysfunction, unspecified: Secondary | ICD-10-CM | POA: Diagnosis not present

## 2016-06-25 DIAGNOSIS — I2510 Atherosclerotic heart disease of native coronary artery without angina pectoris: Secondary | ICD-10-CM

## 2016-06-25 DIAGNOSIS — E785 Hyperlipidemia, unspecified: Secondary | ICD-10-CM | POA: Diagnosis not present

## 2016-06-25 DIAGNOSIS — E119 Type 2 diabetes mellitus without complications: Secondary | ICD-10-CM

## 2016-06-25 DIAGNOSIS — E876 Hypokalemia: Secondary | ICD-10-CM

## 2016-06-25 LAB — BASIC METABOLIC PANEL
BUN: 10 mg/dL (ref 6–23)
CO2: 28 mEq/L (ref 19–32)
Calcium: 9.3 mg/dL (ref 8.4–10.5)
Chloride: 101 mEq/L (ref 96–112)
Creatinine, Ser: 0.83 mg/dL (ref 0.40–1.50)
GFR: 117.23 mL/min (ref >60.00)
Glucose, Bld: 106 mg/dL — ABNORMAL HIGH (ref 70–99)
Potassium: 3.4 mEq/L — ABNORMAL LOW (ref 3.5–5.1)
Sodium: 139 mEq/L (ref 135–145)

## 2016-06-25 LAB — LIPID PANEL
Cholesterol: 136 mg/dL (ref 0–200)
HDL: 33.4 mg/dL — ABNORMAL LOW (ref >39.00)
LDL Cholesterol: 81 mg/dL (ref 0–99)
NonHDL: 102.57
Total CHOL/HDL Ratio: 4
Triglycerides: 109 mg/dL (ref 0.0–149.0)
VLDL: 21.8 mg/dL (ref 0.0–40.0)

## 2016-06-25 LAB — HEMOGLOBIN A1C: Hgb A1c MFr Bld: 6.3 % (ref 4.6–6.5)

## 2016-06-25 MED ORDER — POTASSIUM CHLORIDE CRYS ER 20 MEQ PO TBCR: 20.0000 meq | EXTENDED_RELEASE_TABLET | Freq: Every day | ORAL | 3 refills | Status: DC

## 2016-06-25 NOTE — Telephone Encounter (Signed)
Left message for pt to check mychart acct. Message sent. Future order entered.

## 2016-06-25 NOTE — Assessment & Plan Note (Signed)
Clinically stable, obtain A1C.

## 2016-06-25 NOTE — Assessment & Plan Note (Signed)
Obtain follow up lipid panel, continue statin.  

## 2016-06-25 NOTE — Assessment & Plan Note (Signed)
He did not follow through with cardiology referral but is agreeable to reschedule.

## 2016-06-25 NOTE — Assessment & Plan Note (Signed)
BP stable on current meds, continue same,obtain bmet.  

## 2016-06-25 NOTE — Progress Notes (Signed)
Pre visit review using our clinic review tool, if applicable. No additional management support is needed unless otherwise documented below in the visit note. 

## 2016-06-25 NOTE — Telephone Encounter (Signed)
K+ is a little low. I would like pt to begin kdur 42meq once daily, repeat bmet in 1 week.  I pended order but don't have a local pharmacy listed for him.

## 2016-06-25 NOTE — Assessment & Plan Note (Signed)
Improved on PPI, continue same.  Discussed weight loss as well. Offered nutrition referral, he declines at this time.

## 2016-06-25 NOTE — Patient Instructions (Signed)
You will be contacted about your referral to cardiology. Please call if you have not heard back about this appointment in 1 week.

## 2016-06-25 NOTE — Progress Notes (Signed)
Subjective:    Patient ID: Steven Payne, male    DOB: 01/07/1946, 70 y.o.   MRN: [id number]  HPI  Mr. Kraushaar is a 70 yr old male who presents today for follow up.  He was last seen by me in march 2017.  1) DM2- reports diet needs to be improved. Reports that he is going to the gym, eating more salads.  Wants to join [employer].    Lab Results  Component Value Date   HGBA1C 6.5 02/22/2016   Lab Results  Component Value Date   MICROALBUR 1.4 02/22/2016   LDLCALC 173 (H) 02/22/2016   CREATININE 0.91 02/22/2016   2) HTN-  Currently maintained on diltiazem and diovan-HCT.   BP Readings from Last 3 Encounters:  06/25/16 138/88  03/26/16 135/75  02/22/16 (!) 146/86   3) Hyperlipidemia- last visit atorvastatin was added to his regimen.  Denies myalgia on statin.   Lab Results  Component Value Date   CHOL 236 (H) 02/22/2016   HDL 37.20 (L) 02/22/2016   LDLCALC 173 (H) 02/22/2016   TRIG 132.0 02/22/2016   CHOLHDL 6 02/22/2016   4) GERD- previously uncontrolled on Pepcid. Last visit Pepcid was d/cd and pt was placed on PPI (protonix).  Pt reports that his colo is not due until next year.     Review of Systems See HPI  Past Medical History:  Diagnosis Date  . Diabetes mellitus without complication ([place])   . Gout   . Hyperlipemia   . Hypertension      Social History   Social History  . Marital status: Married    Spouse name: N/A  . Number of children: N/A  . Years of education: N/A   Occupational History  . Not on file.   Social History Main Topics  . Smoking status: Former Smoker    Quit date: 03/27/1991  . Smokeless tobacco: Not on file  . Alcohol use 0.0 oz/week     Comment: Very rarely  . Drug use: No  . Sexual activity: No   Other Topics Concern  . 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  No  grandchildren.  Oldest in [place], youngest in [place]   Completed bachelors, some graduate (Estate manager/land agent)   Enjoys travelling, walking, cruises    Past Surgical History:  Procedure Laterality Date  . APPENDECTOMY    . HERNIA REPAIR      Family History  Problem Relation Age of Onset  . Coronary artery disease Father   . Diabetes Father   . Prostate cancer Father     died at 36  . Coronary artery disease Mother     died age 16  . Alcohol abuse Mother   . Alcohol abuse Father   . Breast cancer Paternal Grandmother   . Cancer Mother     unsure, + thyroid cancer    Allergies  Allergen Reactions  . Tanzeum [Albiglutide] Other (See Comments)    constipation    Current Outpatient Prescriptions on File Prior to Visit  Medication Sig Dispense Refill  . atorvastatin (LIPITOR) 40 MG tablet Take 1 tablet (40 mg total) by mouth daily. 90 tablet 1  . clopidogrel (PLAVIX) 75 MG tablet Take 75 mg by mouth daily.    Marland Kitchen diltiazem (TIAZAC) 300 MG 24 hr capsule  Take 300 mg by mouth daily.    . metFORMIN (GLUCOPHAGE) 1000 MG tablet Take 1,000 mg by mouth daily with breakfast.     . pantoprazole (PROTONIX) 40 MG tablet Take 1 tablet (40 mg total) by mouth daily. 90 tablet 1  . tadalafil (CIALIS) 10 MG tablet Take 10 mg by mouth as needed for erectile dysfunction.    . valsartan-hydrochlorothiazide (DIOVAN-HCT) 160-25 MG tablet Take 1 tablet by mouth daily.     No current facility-administered medications on file prior to visit.     BP 138/88 (BP Location: Right Arm, Cuff Size: Large)   Pulse 64   Temp 97.8 F (36.6 C) (Oral)   Ht 5\' 6"  (1.676 m)   Wt 277 lb (125.6 kg)   SpO2 97% Comment: room air  BMI 44.71 kg/m       Objective:   Physical Exam  Constitutional: He is oriented to person, place, and time. He appears well-developed and well-nourished.  HENT:  Head: Normocephalic and atraumatic.  Cardiovascular: Normal rate.   No murmur heard. Pulmonary/Chest: Effort  normal and breath sounds normal. No respiratory distress. He has no wheezes. He has no rales. He exhibits no tenderness.  Neurological: He is alert and oriented to person, place, and time.  Skin: Skin is warm and dry.  Psychiatric: He has a normal mood and affect. His behavior is normal. Judgment and thought content normal.          Assessment & Plan:

## 2016-06-26 LAB — TESTOSTERONE TOTAL,FREE,BIO, MALES
Albumin: 4.2 g/dL (ref 3.6–5.1)
Sex Hormone Binding: 19 nmol/L — ABNORMAL LOW (ref 22–77)
Testosterone, Bioavailable: 50.2 ng/dL — ABNORMAL LOW (ref 130.5–681.7)
Testosterone, Free: 26.1 pg/mL — ABNORMAL LOW (ref 47.0–244.0)
Testosterone: 141 ng/dL — ABNORMAL LOW (ref 250–827)

## 2016-06-28 ENCOUNTER — Telehealth: Payer: Self-pay | Admitting: Family

## 2016-06-28 DIAGNOSIS — R7989 Other specified abnormal findings of blood chemistry: Secondary | ICD-10-CM

## 2016-06-28 NOTE — Telephone Encounter (Signed)
Testosterone is low.  I would recommend referral to urology if he is willing, referral pended.

## 2016-07-03 NOTE — Telephone Encounter (Signed)
Left message for pt to call back for results and to advise the pt of the urology referral recommended by PCP.

## 2016-07-03 NOTE — Telephone Encounter (Signed)
Notified pt and he voices understanding. Pt willing to proceed with urology referral.

## 2016-07-18 ENCOUNTER — Ambulatory Visit (INDEPENDENT_AMBULATORY_CARE_PROVIDER_SITE_OTHER): Payer: Medicare Other | Admitting: Interventional Cardiology

## 2016-07-18 ENCOUNTER — Encounter: Payer: Self-pay | Admitting: Interventional Cardiology

## 2016-07-18 VITALS — BP 134/82 | HR 69 | Ht 66.0 in | Wt 275.6 lb

## 2016-07-18 DIAGNOSIS — E119 Type 2 diabetes mellitus without complications: Secondary | ICD-10-CM

## 2016-07-18 DIAGNOSIS — I2510 Atherosclerotic heart disease of native coronary artery without angina pectoris: Secondary | ICD-10-CM | POA: Diagnosis not present

## 2016-07-18 DIAGNOSIS — I10 Essential (primary) hypertension: Secondary | ICD-10-CM | POA: Diagnosis not present

## 2016-07-18 DIAGNOSIS — E785 Hyperlipidemia, unspecified: Secondary | ICD-10-CM

## 2016-07-18 NOTE — Progress Notes (Signed)
Cardiology Office Note   Date:  07/18/2016   ID:  Steven Payne, DOB [birth year], MRN [id number]  PCP:  Nance Pear., NP    No chief complaint on file. RF for CAD   Wt Readings from Last 3 Encounters:  07/18/16 275 lb 9.6 oz (125 kg)  06/25/16 277 lb (125.6 kg)  03/26/16 280 lb 9.6 oz (127.3 kg)       History of Present Illness: Steven Payne is a 70 y.o. male  Who has RF for CAD.  He is referred for cardiac evaluation.  He had a cath several years ago at [facility] due to abnormal ECG and high cholesterol.  No PTCA was performed.  He was started on Plavix.  No prior history of DVT.    He has done well since that time.  No chest discomfort.  He exercises at least 3-4x/week.  He continues to work.  He drives a lot.    He has had diabetes.  A1C was 6.3 per his request.  He has relatives with PAD related to DM.    The patient reports a poor diet.  He eats fast food because he is driving a lot.    He is being followed for low testosterone.  Father died of an MI, at age 65.      Past Medical History:  Diagnosis Date  . Diabetes mellitus without complication ([place])   . Gout   . Hyperlipemia   . Hypertension     Past Surgical History:  Procedure Laterality Date  . APPENDECTOMY    . HERNIA REPAIR       Current Outpatient Prescriptions  Medication Sig Dispense Refill  . atorvastatin (LIPITOR) 40 MG tablet Take 1 tablet (40 mg total) by mouth daily. 90 tablet 1  . clopidogrel (PLAVIX) 75 MG tablet Take 75 mg by mouth daily.    Marland Kitchen diltiazem (TIAZAC) 300 MG 24 hr capsule Take 300 mg by mouth daily.    . metFORMIN (GLUCOPHAGE) 1000 MG tablet Take 1,000 mg by mouth daily with breakfast.     . pantoprazole (PROTONIX) 40 MG tablet Take 1 tablet (40 mg total) by mouth daily. 90 tablet 1  . potassium chloride SA (K-DUR,KLOR-CON) 20 MEQ tablet Take 1 tablet (20 mEq total) by mouth daily. 30 tablet 3  . tadalafil (CIALIS) 10 MG tablet Take 10 mg by mouth  as needed for erectile dysfunction.    . valsartan-hydrochlorothiazide (DIOVAN-HCT) 160-25 MG tablet Take 1 tablet by mouth daily.     No current facility-administered medications for this visit.     Allergies:   Tanzeum [albiglutide]    Social History:  The patient  reports that he quit smoking about 25 years ago. He has never used smokeless tobacco. He reports that he drinks alcohol. He reports that he does not use drugs.   Family History:  The patient's family history includes Alcohol abuse in his father and mother; Breast cancer in his paternal grandmother; Cancer in his mother; Coronary artery disease in his father and mother; Diabetes in his father; Prostate cancer in his father.    ROS:  Please see the history of present illness.   Otherwise, review of systems are positive for Difficulty losing weight.   All other systems are reviewed and negative.    PHYSICAL EXAM: VS:  BP 134/82   Pulse 69   Ht 5\' 6"  (1.676 m)   Wt 275 lb 9.6 oz (125 kg)   BMI  44.48 kg/m  , BMI Body mass index is 44.48 kg/m. GEN: Well nourished, well developed, in no acute distress  HEENT: normal  Neck: no JVD, carotid bruits, or masses Cardiac: RRR; no murmurs, rubs, or gallops,no edema  Respiratory:  clear to auscultation bilaterally, normal work of breathing GI: soft, nontender, nondistended, + BS MS: no deformity or atrophy  Skin: warm and dry, no rash Neuro:  Strength and sensation are intact Psych: euthymic mood, full affect   EKG:   The ekg ordered today demonstrates NSR, nonspecific ST segment changes   Recent Labs: 02/22/2016: ALT 16 06/25/2016: BUN 10; Creatinine, Ser 0.83; Potassium 3.4; Sodium 139   Lipid Panel    Component Value Date/Time   CHOL 136 06/25/2016 0741   TRIG 109.0 06/25/2016 0741   HDL 33.40 (L) 06/25/2016 0741   CHOLHDL 4 06/25/2016 0741   VLDL 21.8 06/25/2016 0741   LDLCALC 81 06/25/2016 0741     Other studies Reviewed: Additional studies/ records that were  reviewed today with results demonstrating: .   ASSESSMENT AND PLAN:  1. Abnormal ECG/CAD: No angina. He has not significant ST segment changes. Given his lack of symptoms and negative cardiac catheterization per his report several years ago, would not plan any ischemia evaluation at this time.  I suspect there was some at least mild disease for clopidogrel to be started.  2. Obesity: He will be seeing a nutritionist. We also spoke at length about trying to eat more high protein, high fiber and low carbohydrate foods. We spoke about the [place]. He is trying to lose 20 pounds before the end of the year. This will help his blood sugar control and may help him get off of medication. 3. Continue management of cholesterol, blood pressure and diabetes as he is doing. He was placed on clopidogrel. We'll try to obtain his cath report from [facility]. 4. Family history of heart disease. Stressed importance of lifestyle modifications. He is already increasing his exercise duration. I encouraged him to continue to do this. If he has any problems, he will let us know.   Current medicines are reviewed at length with the patient today.  The patient concerns regarding his medicines were addressed.  The following changes have been made:  No change  Labs/ tests ordered today include:  No orders of the defined types were placed in this encounter.   Recommend 150 minutes/week of aerobic exercise Low fat, low carb, high fiber diet recommended  Disposition:   FU in 1 year   Signed, Larae Grooms, MD  07/18/2016 9:08 AM    [facility] [place], [place], [place]  [address] Phone: [phone]; Fax: [phone]

## 2016-07-18 NOTE — Patient Instructions (Signed)
**Note De-identified Kalyn Hofstra Obfuscation** Medication Instructions:  Same-no changes  Labwork: None  Testing/Procedures: None  Follow-Up: Your physician wants you to follow-up in: 1 year. You will receive a reminder letter in the mail two months in advance. If you don't receive a letter, please call our office to schedule the follow-up appointment.      If you need a refill on your cardiac medications before your next appointment, please call your pharmacy.   

## 2016-07-27 ENCOUNTER — Telehealth: Payer: Self-pay | Admitting: *Deleted

## 2016-07-27 MED ORDER — PANTOPRAZOLE SODIUM 40 MG PO TBEC: 40.0000 mg | DELAYED_RELEASE_TABLET | Freq: Every day | ORAL | 1 refills | Status: DC

## 2016-07-27 NOTE — Telephone Encounter (Signed)
Spoke to pt. He reports that Medicare has birth date listed as Aug 23, 2046 but his dob is really [birth year]. Refill sent via eRx.

## 2016-07-27 NOTE — Telephone Encounter (Signed)
Received fax from [facility] stating they received a denial note with "patient unknown to Provider". Spoke with pharmacist, Prea and gave verbal auth to dispense #90 x 1 refill and made them aware of DOB discrepancy.

## 2016-07-27 NOTE — Telephone Encounter (Signed)
Received fax from [facility] requesting refills on Pantoprazole 40mg . Fax from [facility] lists pt's dob as [birth year]. Same address as on file for pt. Attempted to verify dob with pt and left message to check mychart. Message sent.

## 2016-08-11 ENCOUNTER — Other Ambulatory Visit: Payer: Self-pay | Admitting: Family

## 2016-08-20 DIAGNOSIS — F4310 Post-traumatic stress disorder, unspecified: Secondary | ICD-10-CM | POA: Diagnosis not present

## 2016-08-20 DIAGNOSIS — Z9149 Other personal history of psychological trauma, not elsewhere classified: Secondary | ICD-10-CM | POA: Diagnosis not present

## 2016-08-20 DIAGNOSIS — Z62891 Sibling rivalry: Secondary | ICD-10-CM | POA: Diagnosis not present

## 2016-08-21 DIAGNOSIS — E291 Testicular hypofunction: Secondary | ICD-10-CM | POA: Diagnosis not present

## 2016-08-21 DIAGNOSIS — N433 Hydrocele, unspecified: Secondary | ICD-10-CM | POA: Diagnosis not present

## 2016-08-21 DIAGNOSIS — Z125 Encounter for screening for malignant neoplasm of prostate: Secondary | ICD-10-CM | POA: Diagnosis not present

## 2016-08-27 ENCOUNTER — Ambulatory Visit: Payer: Medicare Other

## 2016-08-27 ENCOUNTER — Telehealth: Payer: Self-pay | Admitting: Interventional Cardiology

## 2016-08-27 NOTE — Telephone Encounter (Signed)
New message     Request for surgical clearance:  1. What type of surgery is being performed? Hydro- celectromy  2. When is this surgery scheduled? Pending   3. Are there any medications that need to be held prior to surgery and how long?plavix 3 days   4. Name of physician performing surgery? Dr.Ottlin  5. What is your office phone and fax number? [phone] /  ext [phone]. fax [phone]

## 2016-08-27 NOTE — Telephone Encounter (Signed)
To Dr Irish Lack for review

## 2016-08-28 NOTE — Telephone Encounter (Signed)
**Note De-identified Kaitlin Ardito Obfuscation** Please advise 

## 2016-08-29 NOTE — Telephone Encounter (Signed)
No further cardiac testing needed before surgery.  OK to hold Plavix 5 days prior to procedure.

## 2016-08-30 ENCOUNTER — Other Ambulatory Visit: Payer: Self-pay | Admitting: Urology

## 2016-08-30 NOTE — Telephone Encounter (Signed)
**Note De-Identified Carlisle Torgeson Obfuscation** This message has been faxed to [facility] at [phone]. I did receive confirmation that the fax was successful.

## 2016-09-04 ENCOUNTER — Ambulatory Visit: Payer: Medicare Other

## 2016-09-04 ENCOUNTER — Ambulatory Visit (INDEPENDENT_AMBULATORY_CARE_PROVIDER_SITE_OTHER): Payer: Medicare Other

## 2016-09-04 DIAGNOSIS — Z23 Encounter for immunization: Secondary | ICD-10-CM | POA: Diagnosis not present

## 2016-09-05 ENCOUNTER — Encounter (HOSPITAL_BASED_OUTPATIENT_CLINIC_OR_DEPARTMENT_OTHER): Payer: Self-pay | Admitting: *Deleted

## 2016-09-06 ENCOUNTER — Encounter (HOSPITAL_BASED_OUTPATIENT_CLINIC_OR_DEPARTMENT_OTHER): Payer: Self-pay | Admitting: *Deleted

## 2016-09-06 NOTE — Progress Notes (Signed)
NPO AFTER MN.  ARRIVE AT 0600.  NEEDS ISTAT.  CURRENT EKG IN CHART AND EPIC.  WILL TAKE PROTONIX AND TIAZAC AM DOS W/ SIPS OF WATER.

## 2016-09-10 ENCOUNTER — Ambulatory Visit (HOSPITAL_BASED_OUTPATIENT_CLINIC_OR_DEPARTMENT_OTHER)
Admission: RE | Admit: 2016-09-10 | Discharge: 2016-09-10 | Disposition: A | Discharge status: Home / Self Care | Payer: Medicare Other | Source: Ambulatory Visit | Attending: Urology | Admitting: Urology

## 2016-09-10 ENCOUNTER — Encounter (HOSPITAL_BASED_OUTPATIENT_CLINIC_OR_DEPARTMENT_OTHER): Payer: Self-pay | Admitting: *Deleted

## 2016-09-10 ENCOUNTER — Ambulatory Visit (HOSPITAL_BASED_OUTPATIENT_CLINIC_OR_DEPARTMENT_OTHER): Payer: Medicare Other | Admitting: Anesthesiology

## 2016-09-10 ENCOUNTER — Encounter (HOSPITAL_BASED_OUTPATIENT_CLINIC_OR_DEPARTMENT_OTHER)
Admission: RE | Disposition: A | Discharge status: Home / Self Care | Payer: Self-pay | Source: Ambulatory Visit | Attending: Urology

## 2016-09-10 DIAGNOSIS — K219 Gastro-esophageal reflux disease without esophagitis: Secondary | ICD-10-CM | POA: Diagnosis not present

## 2016-09-10 DIAGNOSIS — N442 Benign cyst of testis: Secondary | ICD-10-CM

## 2016-09-10 DIAGNOSIS — Z6841 Body Mass Index (BMI) 40.0 and over, adult: Secondary | ICD-10-CM | POA: Insufficient documentation

## 2016-09-10 DIAGNOSIS — E119 Type 2 diabetes mellitus without complications: Secondary | ICD-10-CM | POA: Insufficient documentation

## 2016-09-10 DIAGNOSIS — Z833 Family history of diabetes mellitus: Secondary | ICD-10-CM | POA: Diagnosis not present

## 2016-09-10 DIAGNOSIS — I10 Essential (primary) hypertension: Secondary | ICD-10-CM | POA: Insufficient documentation

## 2016-09-10 DIAGNOSIS — Z87891 Personal history of nicotine dependence: Secondary | ICD-10-CM | POA: Insufficient documentation

## 2016-09-10 DIAGNOSIS — N441 Cyst of tunica albuginea testis: Secondary | ICD-10-CM | POA: Insufficient documentation

## 2016-09-10 DIAGNOSIS — E139 Other specified diabetes mellitus without complications: Secondary | ICD-10-CM | POA: Diagnosis not present

## 2016-09-10 DIAGNOSIS — N433 Hydrocele, unspecified: Secondary | ICD-10-CM | POA: Diagnosis not present

## 2016-09-10 HISTORY — DX: Atherosclerotic heart disease of native coronary artery without angina pectoris: I25.10

## 2016-09-10 HISTORY — DX: Unspecified osteoarthritis, unspecified site: M19.90

## 2016-09-10 HISTORY — DX: Gastro-esophageal reflux disease without esophagitis: K21.9

## 2016-09-10 HISTORY — DX: Personal history of other diseases of the musculoskeletal system and connective tissue: Z87.39

## 2016-09-10 HISTORY — PX: HYDROCELE EXCISION: SHX482

## 2016-09-10 HISTORY — DX: Male erectile dysfunction, unspecified: N52.9

## 2016-09-10 HISTORY — DX: Hydrocele, unspecified: N43.3

## 2016-09-10 HISTORY — DX: Testicular hypofunction: E29.1

## 2016-09-10 HISTORY — DX: Type 2 diabetes mellitus without complications: E11.9

## 2016-09-10 LAB — POCT I-STAT 4, (NA,K, GLUC, HGB,HCT)
Glucose, Bld: 112 mg/dL — ABNORMAL HIGH (ref 65–99)
HCT: 41 % (ref 39.0–52.0)
Hemoglobin: 13.9 g/dL (ref 13.0–17.0)
Potassium: 3.1 mmol/L — ABNORMAL LOW (ref 3.5–5.1)
Sodium: 142 mmol/L (ref 135–145)

## 2016-09-10 SURGERY — HYDROCELECTOMY
Anesthesia: General | Site: Scrotum | Laterality: Left

## 2016-09-10 MED ORDER — FENTANYL CITRATE (PF) 100 MCG/2ML IJ SOLN
INTRAMUSCULAR | Status: DC | PRN
  Administered 2016-09-10: 25 ug via INTRAVENOUS
  Administered 2016-09-10: 50 ug via INTRAVENOUS
  Administered 2016-09-10 (×2): 25 ug via INTRAVENOUS

## 2016-09-10 MED ORDER — SUGAMMADEX SODIUM 200 MG/2ML IV SOLN
INTRAVENOUS | Status: DC | PRN
  Administered 2016-09-10: 250 mg via INTRAVENOUS

## 2016-09-10 MED ORDER — MIDAZOLAM HCL 2 MG/2ML IJ SOLN
INTRAMUSCULAR | Status: AC
  Filled 2016-09-10: qty 2

## 2016-09-10 MED ORDER — SUCCINYLCHOLINE CHLORIDE 20 MG/ML IJ SOLN
INTRAMUSCULAR | Status: AC
  Filled 2016-09-10: qty 1

## 2016-09-10 MED ORDER — DEXAMETHASONE SODIUM PHOSPHATE 4 MG/ML IJ SOLN
INTRAMUSCULAR | Status: DC | PRN
  Administered 2016-09-10: 10 mg via INTRAVENOUS

## 2016-09-10 MED ORDER — ROCURONIUM BROMIDE 10 MG/ML (PF) SYRINGE
PREFILLED_SYRINGE | INTRAVENOUS | Status: AC
  Filled 2016-09-10: qty 10

## 2016-09-10 MED ORDER — OXYCODONE HCL 10 MG PO TABS: 10.0000 mg | ORAL_TABLET | ORAL | 0 refills | Status: DC | PRN

## 2016-09-10 MED ORDER — METOCLOPRAMIDE HCL 5 MG/ML IJ SOLN
INTRAMUSCULAR | Status: AC
  Filled 2016-09-10: qty 2

## 2016-09-10 MED ORDER — ONDANSETRON HCL 4 MG/2ML IJ SOLN
INTRAMUSCULAR | Status: DC | PRN
  Administered 2016-09-10: 4 mg via INTRAVENOUS

## 2016-09-10 MED ORDER — SUGAMMADEX SODIUM 200 MG/2ML IV SOLN
INTRAVENOUS | Status: AC
  Filled 2016-09-10: qty 4

## 2016-09-10 MED ORDER — LIDOCAINE 2% (20 MG/ML) 5 ML SYRINGE
INTRAMUSCULAR | Status: AC
  Filled 2016-09-10: qty 5

## 2016-09-10 MED ORDER — LIDOCAINE 2% (20 MG/ML) 5 ML SYRINGE
INTRAMUSCULAR | Status: DC | PRN
  Administered 2016-09-10: 60 mg via INTRAVENOUS

## 2016-09-10 MED ORDER — METOCLOPRAMIDE HCL 5 MG/ML IJ SOLN
INTRAMUSCULAR | Status: DC | PRN
  Administered 2016-09-10: 10 mg via INTRAVENOUS

## 2016-09-10 MED ORDER — FENTANYL CITRATE (PF) 100 MCG/2ML IJ SOLN
25.0000 ug | INTRAMUSCULAR | Status: DC | PRN
  Administered 2016-09-10: 50 ug via INTRAVENOUS
  Administered 2016-09-10 (×2): 25 ug via INTRAVENOUS
  Filled 2016-09-10: qty 1

## 2016-09-10 MED ORDER — SUCCINYLCHOLINE CHLORIDE 200 MG/10ML IV SOSY
PREFILLED_SYRINGE | INTRAVENOUS | Status: DC | PRN
  Administered 2016-09-10: 120 mg via INTRAVENOUS

## 2016-09-10 MED ORDER — DEXTROSE 5 % IV SOLN
3.0000 g | INTRAVENOUS | Status: AC
  Administered 2016-09-10: 3 g via INTRAVENOUS
  Filled 2016-09-10: qty 3000

## 2016-09-10 MED ORDER — FENTANYL CITRATE (PF) 100 MCG/2ML IJ SOLN
INTRAMUSCULAR | Status: AC
Start: 2016-09-10 — End: 2016-09-10
  Filled 2016-09-10: qty 2

## 2016-09-10 MED ORDER — FENTANYL CITRATE (PF) 100 MCG/2ML IJ SOLN
INTRAMUSCULAR | Status: AC
  Filled 2016-09-10: qty 2

## 2016-09-10 MED ORDER — BUPIVACAINE-EPINEPHRINE 0.5% -1:200000 IJ SOLN
INTRAMUSCULAR | Status: DC | PRN
  Administered 2016-09-10: 10 mL

## 2016-09-10 MED ORDER — LACTATED RINGERS IV SOLN
INTRAVENOUS | Status: DC
  Administered 2016-09-10 (×2): via INTRAVENOUS
  Filled 2016-09-10: qty 1000

## 2016-09-10 MED ORDER — DEXAMETHASONE SODIUM PHOSPHATE 10 MG/ML IJ SOLN
INTRAMUSCULAR | Status: AC
  Filled 2016-09-10: qty 1

## 2016-09-10 MED ORDER — TRIPLE ANTIBIOTIC 3.5-400-5000 EX OINT
TOPICAL_OINTMENT | CUTANEOUS | Status: DC | PRN
  Administered 2016-09-10: 1 via CUTANEOUS

## 2016-09-10 MED ORDER — SODIUM CHLORIDE 0.9 % IR SOLN
Status: DC | PRN
  Administered 2016-09-10: 1000 mL

## 2016-09-10 MED ORDER — CEFAZOLIN IN D5W 1 GM/50ML IV SOLN
INTRAVENOUS | Status: AC
Start: 2016-09-10 — End: 2016-09-10
  Filled 2016-09-10: qty 50

## 2016-09-10 MED ORDER — GLYCOPYRROLATE 0.2 MG/ML IV SOSY
PREFILLED_SYRINGE | INTRAVENOUS | Status: DC | PRN
  Administered 2016-09-10: .2 mg via INTRAVENOUS

## 2016-09-10 MED ORDER — PROPOFOL 10 MG/ML IV BOLUS
INTRAVENOUS | Status: DC | PRN
  Administered 2016-09-10: 200 mg via INTRAVENOUS

## 2016-09-10 MED ORDER — CEFAZOLIN SODIUM-DEXTROSE 2-4 GM/100ML-% IV SOLN
INTRAVENOUS | Status: AC
  Filled 2016-09-10: qty 100

## 2016-09-10 MED ORDER — ROCURONIUM BROMIDE 10 MG/ML (PF) SYRINGE
PREFILLED_SYRINGE | INTRAVENOUS | Status: DC | PRN
  Administered 2016-09-10: 20 mg via INTRAVENOUS
  Administered 2016-09-10: 10 mg via INTRAVENOUS

## 2016-09-10 MED ORDER — PROPOFOL 10 MG/ML IV BOLUS
INTRAVENOUS | Status: AC
  Filled 2016-09-10: qty 20

## 2016-09-10 MED ORDER — ONDANSETRON HCL 4 MG/2ML IJ SOLN
INTRAMUSCULAR | Status: AC
  Filled 2016-09-10: qty 2

## 2016-09-10 MED ORDER — PROMETHAZINE HCL 25 MG/ML IJ SOLN
6.2500 mg | INTRAMUSCULAR | Status: DC | PRN
  Filled 2016-09-10: qty 1

## 2016-09-10 MED ORDER — CEFAZOLIN SODIUM-DEXTROSE 2-4 GM/100ML-% IV SOLN
2.0000 g | INTRAVENOUS | Status: DC
  Filled 2016-09-10: qty 100

## 2016-09-10 SURGICAL SUPPLY — 33 items
BLADE CLIPPER SURG (BLADE) ×1 IMPLANT
BLADE SURG 15 STRL LF DISP TIS (BLADE) ×1 IMPLANT
BLADE SURG 15 STRL SS (BLADE) ×2
BNDG GAUZE ELAST 4 BULKY (GAUZE/BANDAGES/DRESSINGS) ×2 IMPLANT
CANISTER SUCTION 1200CC (MISCELLANEOUS) IMPLANT
CANISTER SUCTION 2500CC (MISCELLANEOUS) ×1 IMPLANT
CLEANER CAUTERY TIP 5X5 PAD (MISCELLANEOUS) IMPLANT
COVER BACK TABLE 60X90IN (DRAPES) ×2 IMPLANT
COVER MAYO STAND STRL (DRAPES) ×2 IMPLANT
DRAIN PENROSE 18X1/4 LTX STRL (WOUND CARE) IMPLANT
DRAPE LAPAROTOMY 100X72 PEDS (DRAPES) ×2 IMPLANT
ELECT REM PT RETURN 9FT ADLT (ELECTROSURGICAL) ×2
ELECTRODE REM PT RTRN 9FT ADLT (ELECTROSURGICAL) IMPLANT
GLOVE BIO SURGEON STRL SZ8 (GLOVE) ×3 IMPLANT
GOWN STRL REUS W/TWL LRG LVL3 (GOWN DISPOSABLE) ×1 IMPLANT
GOWN STRL REUS W/TWL XL LVL3 (GOWN DISPOSABLE) ×1 IMPLANT
KIT ROOM TURNOVER WOR (KITS) ×2 IMPLANT
NDL HYPO 25X1 1.5 SAFETY (NEEDLE) IMPLANT
NEEDLE HYPO 25X1 1.5 SAFETY (NEEDLE) ×2 IMPLANT
NS IRRIG 500ML POUR BTL (IV SOLUTION) ×1 IMPLANT
PACK BASIN DAY SURGERY FS (CUSTOM PROCEDURE TRAY) ×2 IMPLANT
PAD CLEANER CAUTERY TIP 5X5 (MISCELLANEOUS) ×2
PENCIL BUTTON HOLSTER BLD 10FT (ELECTRODE) ×2 IMPLANT
SPONGE GAUZE 4X4 12PLY STER LF (GAUZE/BANDAGES/DRESSINGS) ×1 IMPLANT
SUPPORT SCROTAL LG STRP (MISCELLANEOUS) ×2 IMPLANT
SUT CHROMIC 3 0 SH 27 (SUTURE) ×5 IMPLANT
SUT ETHILON 3 0 PS 1 (SUTURE) IMPLANT
SUT VICRYL 6 0 RB 1 (SUTURE) ×1 IMPLANT
SYR CONTROL 10ML LL (SYRINGE) ×1 IMPLANT
TOWEL OR 17X24 6PK STRL BLUE (TOWEL DISPOSABLE) ×4 IMPLANT
TRAY DSU PREP LF (CUSTOM PROCEDURE TRAY) ×2 IMPLANT
TUBE CONNECTING 12X1/4 (SUCTIONS) ×2 IMPLANT
YANKAUER SUCT BULB TIP NO VENT (SUCTIONS) ×2 IMPLANT

## 2016-09-10 NOTE — Anesthesia Procedure Notes (Signed)
Procedure Name: Intubation Date/Time: 09/10/2016 7:30 AM Performed by: Denna Haggard D Pre-anesthesia Checklist: Patient identified, Emergency Drugs available, Suction available and Patient being monitored Patient Re-evaluated:Patient Re-evaluated prior to inductionOxygen Delivery Method: Circle system utilized Preoxygenation: Pre-oxygenation with 100% oxygen Intubation Type: IV induction Ventilation: Mask ventilation without difficulty Laryngoscope Size: Mac and 4 Grade View: Grade I Tube type: Oral Tube size: 8.0 mm Number of attempts: 1 Airway Equipment and Method: Stylet and Oral airway Placement Confirmation: ETT inserted through vocal cords under direct vision,  positive ETCO2 and breath sounds checked- equal and bilateral Secured at: 22 cm Tube secured with: Tape Dental Injury: Teeth and Oropharynx as per pre-operative assessment

## 2016-09-10 NOTE — Transfer of Care (Signed)
Immediate Anesthesia Transfer of Care Note  Patient: Steven Payne  Procedure(s) Performed: Procedure(s) (LRB): HYDROCELECTOMY ADULT (Left)  Patient Location: PACU  Anesthesia Type: General  Level of Consciousness: awake, oriented, sedated and patient cooperative  Airway & Oxygen Therapy: Patient Spontanous Breathing and Patient connected to face mask oxygen  Post-op Assessment: Report given to PACU RN and Post -op Vital signs reviewed and stable  Post vital signs: Reviewed and stable  Complications: No apparent anesthesia complications Last Vitals:  Vitals:   09/10/16 0630 09/10/16 0836  BP: 140/72 121/75  Pulse:  86  Resp: 18 10  Temp: 36.8 C 36.7 C

## 2016-09-10 NOTE — Anesthesia Preprocedure Evaluation (Signed)
Anesthesia Evaluation  Patient identified by MRN, date of birth, ID band Patient awake    Reviewed: Allergy & Precautions, NPO status , Patient's Chart, lab work & pertinent test results  Airway Mallampati: II  TM Distance: >3 FB Neck ROM: Full    Dental no notable dental hx.    Pulmonary neg pulmonary ROS, former smoker,    Pulmonary exam normal breath sounds clear to auscultation       Cardiovascular hypertension, Normal cardiovascular exam Rhythm:Regular Rate:Normal     Neuro/Psych negative neurological ROS  negative psych ROS   GI/Hepatic Neg liver ROS, GERD  ,  Endo/Other  diabetesMorbid obesity  Renal/GU negative Renal ROS  negative genitourinary   Musculoskeletal negative musculoskeletal ROS (+)   Abdominal   Peds negative pediatric ROS (+)  Hematology negative hematology ROS (+)   Anesthesia Other Findings   Reproductive/Obstetrics negative OB ROS                             Anesthesia Physical Anesthesia Plan  ASA: III  Anesthesia Plan: General   Post-op Pain Management:    Induction: Intravenous  Airway Management Planned: LMA  Additional Equipment:   Intra-op Plan:   Post-operative Plan: Extubation in OR  Informed Consent: I have reviewed the patients History and Physical, chart, labs and discussed the procedure including the risks, benefits and alternatives for the proposed anesthesia with the patient or authorized representative who has indicated his/her understanding and acceptance.   Dental advisory given  Plan Discussed with: CRNA and Surgeon  Anesthesia Plan Comments:         Anesthesia Quick Evaluation

## 2016-09-10 NOTE — Discharge Instructions (Addendum)
Post Anesthesia Home Care Instructions  Activity: Get plenty of rest for the remainder of the day. A responsible adult should stay with you for 24 hours following the procedure.  For the next 24 hours, DO NOT: -Drive a car -Paediatric nurse -Drink alcoholic beverages -Take any medication unless instructed by your physician -Make any legal decisions or sign important papers.  Meals: Start with liquid foods such as gelatin or soup. Progress to regular foods as tolerated. Avoid greasy, spicy, heavy foods. If nausea and/or vomiting occur, drink only clear liquids until the nausea and/or vomiting subsides. Call your physician if vomiting continues.  Special Instructions/Symptoms: Your throat may feel dry or sore from the anesthesia or the breathing tube placed in your throat during surgery. If this causes discomfort, gargle with warm salt water. The discomfort should disappear within 24 hours.  If you had a scopolamine patch placed behind your ear for the management of post- operative nausea and/or vomiting:  1. The medication in the patch is effective for 72 hours, after which it should be removed.  Wrap patch in a tissue and discard in the trash. Wash hands thoroughly with soap and water. 2. You may remove the patch earlier than 72 hours if you experience unpleasant side effects which may include dry mouth, dizziness or visual disturbances. 3. Avoid touching the patch. Wash your hands with soap and water after contact with the patch.   Scrotal surgery postoperative instructions  Wound:  In most cases your incision will have absorbable sutures that will dissolve within the first 10-20 days. Some will fall out even earlier. Expect some redness as the sutures dissolved but this should occur only around the sutures. If there is generalized redness, especially with increasing pain or swelling, let us know. The scrotum will very likely get "black and blue" as the blood in the tissues spread.  Sometimes the whole scrotum will turn colors. The black and blue is followed by a yellow and brown color. In time, all the discoloration will go away. In some cases some firm swelling in the area of the testicle may persist for up to 4-6 weeks after the surgery and is considered normal in most cases.  Diet:  You may return to your normal diet within 24 hours following your surgery. You may note some mild nausea and possibly vomiting the first 6-8 hours following surgery. This is usually due to the side effects of anesthesia, and will disappear quite soon. I would suggest clear liquids and a very light meal the first evening following your surgery.  Activity:  Your physical activity should be restricted the first 48 hours. During that time you should remain relatively inactive, moving about only when necessary. During the first 7-10 days following surgery he should avoid lifting any heavy objects (anything greater than 15 pounds), and avoid strenuous exercise. If you work, ask [place] specifically about your restrictions, both for work and home. We will write a note to your employer if needed.  You should plan to wear a tight pair of jockey shorts or an athletic supporter for the first 4-5 days, even to sleep. This will keep the scrotum immobilized to some degree and keep the swelling down.  Ice packs should be placed on and off over the scrotum for the first 48 hours. Frozen peas or corn in a ZipLock bag can be frozen, used and re-frozen. Fifteen minutes on and 15 minutes off is a reasonable schedule. The ice is a good pain reliever and keeps  the swelling down.  Hygiene:  You may shower 48 hours after your surgery. Tub bathing should be restricted until the seventh day.          Medication:  You will be sent home with some type of pain medication. In many cases you will be sent home with a narcotic pain pill (hydrococone or oxycodone). If the pain is not too bad, you may take either Tylenol  (acetaminophen) or Advil (ibuprofen) which contain no narcotic agents, and might be tolerated a little better, with fewer side effects. If the pain medication you are sent home with does not control the pain, you will have to let us know. Some narcotic pain medications cannot be given or refilled by a phone call to a pharmacy. Restart Plavix in 48 hours.  Problems you should report to [place]:   Fever of 101.0 degrees Fahrenheit or greater.  Moderate or severe swelling under the skin incision or involving the scrotum.  Drug reaction such as hives, a rash, nausea or vomiting.

## 2016-09-10 NOTE — Op Note (Signed)
PATIENT:  Steven Payne  PRE-OPERATIVE DIAGNOSIS: Left hydrocele  POST-OPERATIVE DIAGNOSIS:  Large left intratesticular cystic degeneration  PROCEDURE:  Procedure(s): 1. Scrotal exploration 2. Drainage of intratesticular cyst  SURGEON:  Claybon Jabs  INDICATION: Steven Payne is a 70 year old male with a large left hemiscrotum that has been symptomatic. He has elected proceed with surgical correction. He has stopped his Plavix 5 days prior to the procedure.  ANESTHESIA:  General  EBL:  Minimal  DRAINS: None  LOCAL MEDICATIONS USED:  1/2% Marcaine with epinephrine  SPECIMEN:  Visceral tunica albuginea/testicular capsule  DISPOSITION OF SPECIMEN:  N/A  Description of procedure: After informed consent the patient was brought to the major OR, placed on the table and administered general anesthesia. His genitalia was then sterilely prepped and draped. An official timeout was then performed.  A midline median raphae scrotal incision was then made and carried down over the left testicle. I found upon opening the parietal tunica albuginea that there was essentially no hydrocele fluid present. I delivered the testicle and noted it appeared essentially normal. When compared to the contralateral testicle it seemed to be of similar consistency with the contralateral testicle being of normal size with no apparent atrophy. Since the testicle was nearly 3-4 times the size of the contralateral testicle my initial concern was that of a neoplastic process although palpation of the testicle did not reveal any firm or worrisome areas.   I therefore initially made a transverse incision in the parietal tunica albuginea and encountered what appeared to be some tubules that appeared normal but as I attempted to obtain a biopsy of the tubules there was spontaneous drainage of completely clear fluid of a very significant amount. I therefore widened the incision cephalad and caudad and noted a large, cavernous  cystic degeneration of the testicle which essentially no normal apparent intratesticular contents. Palpation revealed no worrisome nodularity or induration once again. This did not appear to me to be a neoplastic process but rather cystic degeneration of the left testicle.  I elected to excise all of the excess visceral tunica albuginea leaving the portion attached to the epididymis intact and sent this to pathology. The edges were then cauterized and I oversewed the edges with a running, locking 3-0 chromic suture. I replaced the remaining structures in the normal anatomic position within the left hemiscrotum, irrigated copiously with saline and I then closed the deep scrotal tissue with running 3-0 chromic suture in a locking fashion. I injected half percent Marcaine with epinephrine in the subcutaneous tissue and closed the skin with running 3-0 chromic. Neosporin, a sterile gauze dressing, fluff Kerlix and a scrotal support were applied. The patient tolerated the procedure well no intraoperative complications. Needle sponge and instrument counts were correct at the end of the operation.   Note: During the operation a needle stick occurred to the surgeon. Appropriate blood tests were obtained and the patient's wife was informed of this occurrence.  PLAN OF CARE: Discharge to home after PACU  PATIENT DISPOSITION:  PACU - hemodynamically stable.

## 2016-09-10 NOTE — Anesthesia Postprocedure Evaluation (Signed)
Anesthesia Post Note  Patient: Steven Payne  Procedure(s) Performed: Procedure(s) (LRB): HYDROCELECTOMY ADULT (Left)  Patient location during evaluation: PACU Anesthesia Type: General Level of consciousness: awake and alert Pain management: pain level controlled Vital Signs Assessment: post-procedure vital signs reviewed and stable Respiratory status: spontaneous breathing, nonlabored ventilation, respiratory function stable and patient connected to nasal cannula oxygen Cardiovascular status: blood pressure returned to baseline and stable Postop Assessment: no signs of nausea or vomiting Anesthetic complications: no    Last Vitals:  Vitals:   09/10/16 0845 09/10/16 0900  BP: 115/64 117/75  Pulse: 89 91  Resp: 16 20  Temp:      Last Pain:  Vitals:   09/10/16 0900  TempSrc:   PainSc: 6                  Purity Irmen S

## 2016-09-10 NOTE — H&P (Signed)
HPI: Steven Payne is a 70 year-old male with a left hydrocele   He does have difficulty with erections.   He does have difficulty with erectile function. No symptoms suggesting hypogonadism however were noted in information supplied. A testosterone level was obtained in 7/17 and found to be low (141/26.1).   His father had prostate cancer.   What he reports is that he has noted decreased energy levels and difficulty with endurance at the gym. He still maintains a good libido. He has some erectile dysfunction but this has responded to Cialis.     CC: I have scrotal swelling.  HPI: He first noticed his hydrocele 2 years ago. His hydrocele is on the left side. The patient has not had a scrotal ultrasound. He does not have pain on the side of his hydrocele. His hydrocele does cause restriction of normal activities.   He has not had injuries to the testicles or scrotum. He has had the following surgeries: Hernia Repair. The patient denies having the following scrotal surgeries: Vasectomy, Hydrocele Repair, Undescended Testis Surgery, and Varicocele Repair. He has not had a testicular infection. His hydrocele does bother him enough to consider surgical repair.     ALLERGIES: None   MEDICATIONS: Cialis 10 mg tablet  Metformin Hcl 1,000 mg tablet  Plavix 75 mg tablet  Atorvastatin Calcium 40 mg tablet  Diltiazem 24Hr Cd 300 mg capsule, ext release 24 hr  Pantoprazole Sodium 40 mg tablet, delayed release  Potassium Chloride 20 meq tablet, extended release  Valsartan-Hydrochlorothiazide 160 mg-25 mg tablet     GU PSH: Hernia Repair    NON-GU PSH: Appendectomy    GU PMH: None   NON-GU PMH: Arthritis Asthma Diabetes Type 2 GERD Gout Hyperlipidemia, unspecified Hypertension    FAMILY HISTORY: 2 sons - Son Breast Cancer - Grandmother Coronary Artery Disease - Mother, Father Death of family member - Mother, Father Diabetes - Father Prostate Cancer - Father   SOCIAL HISTORY:  Marital Status: Married Current Smoking Status: Patient does not smoke anymore. Has not smoked since 08/03/1986.  Does not drink anymore.  Drinks 3 caffeinated drinks per day.    REVIEW OF SYSTEMS:    GU Review Male:   Patient reports erection problems. Patient denies frequent urination, hard to postpone urination, burning/ pain with urination, get up at night to urinate, leakage of urine, stream starts and stops, trouble starting your stream, have to strain to urinate , and penile pain.  Gastrointestinal (Upper):   Patient denies nausea, vomiting, and indigestion/ heartburn.  Gastrointestinal (Lower):   Patient denies diarrhea and constipation.  Constitutional:   Patient denies fever, night sweats, weight loss, and fatigue.  Skin:   Patient denies skin rash/ lesion and itching.  Eyes:   Patient denies blurred vision and double vision.  Ears/ Nose/ Throat:   Patient denies sore throat and sinus problems.  Hematologic/Lymphatic:   Patient denies swollen glands and easy bruising.  Cardiovascular:   Patient denies leg swelling and chest pains.  Respiratory:   Patient denies cough and shortness of breath.  Endocrine:   Patient denies excessive thirst.  Musculoskeletal:   Patient denies back pain and joint pain.  Neurological:   Patient denies headaches and dizziness.  Psychologic:   Patient denies depression and anxiety.   VITAL SIGNS:    Weight 270 lb / 122.47 kg  Height 67 in / 170.18 cm  BP 143/82 mmHg  Pulse 66 /min  BMI 42.3 kg/m   GU PHYSICAL EXAMINATION:  Anus and Perineum: No hemorrhoids. No anal stenosis. No rectal fissure, no anal fissure. No edema, no dimple, no perineal tenderness, no anal tenderness.  Scrotum: No lesions. No edema. No cysts. No warts.  Epididymides: Right: no spermatocele, no masses, no cysts, no tenderness, no induration, no enlargement. Left: no spermatocele, no masses, no cysts, no tenderness, no induration, no enlargement.  Testes: 3-5 cm hydrocele left  testis. No tenderness, no swelling, no enlargement left testis. No tenderness, no swelling, no enlargement right testis. Normal location left testis. Normal location right testis. No mass, no cyst, no varicocele left testis. No mass, no cyst, no varicocele, no hydrocele right testis.   Urethral Meatus: Normal size. No lesion, no wart, no discharge, no polyp. Normal location.  Penis: Circumcised, no warts, no cracks. No dorsal Peyronie's plaques, no left corporal Peyronie's plaques, no right corporal Peyronie's plaques, no scarring, no warts. No balanitis, no meatal stenosis.  Prostate: Prostate 1 1/2+ size. Left lobe normal consistency, right lobe normal consistency. Symmetrical lobes. No prostate nodule. Left lobe no tenderness, right lobe no tenderness.   Seminal Vesicles: Nonpalpable.  Sphincter Tone: Normal sphincter. No rectal tenderness. No rectal mass.    MULTI-SYSTEM PHYSICAL EXAMINATION:    Constitutional: Well-nourished. No physical deformities. Normally developed. Good grooming.  Neck: Neck symmetrical, not swollen. Normal tracheal position.  Respiratory: No labored breathing, no use of accessory muscles.   Cardiovascular: Normal temperature, normal extremity pulses, no swelling, no varicosities.  Lymphatic: No enlargement of neck, axillae, groin.  Skin: No paleness, no jaundice, no cyanosis. No lesion, no ulcer, no rash.  Neurologic / Psychiatric: Oriented to time, oriented to place, oriented to person. No depression, no anxiety, no agitation.  Gastrointestinal: No mass, no tenderness, no rigidity, non obese abdomen.  Eyes: Normal conjunctivae. Normal eyelids.  Ears, Nose, Mouth, and Throat: Left ear no scars, no lesions, no masses. Right ear no scars, no lesions, no masses. Nose no scars, no lesions, no masses. Normal hearing. Normal lips.  Musculoskeletal: Normal gait and station of head and neck.   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 I will obtain that today as well as the other hormones of his pituitary gonadal axis. In addition I will obtain a baseline PSA. He'll then return to go over the results of his further blood work and discuss testosterone replacement.  2   Hydrocele, Unspec - N43.3 His left hydrocele is not particularly large but it is symptomatic and he would like to have something done about it. We discussed aspiration and its low success rate. I discussed hydrocelectomy and went over the procedure with him in detail including its risks and complications, the outpatient nature of the procedure as well as the probability of success and the anticipated postoperative course. He understands and would like to proceed with surgical correction.   PLAN:   Left hydrocelectomy.

## 2016-09-11 ENCOUNTER — Encounter (HOSPITAL_BASED_OUTPATIENT_CLINIC_OR_DEPARTMENT_OTHER): Payer: Self-pay | Admitting: Urology

## 2016-09-24 DIAGNOSIS — N441 Cyst of tunica albuginea testis: Secondary | ICD-10-CM | POA: Diagnosis not present

## 2016-09-27 DIAGNOSIS — Z9149 Other personal history of psychological trauma, not elsewhere classified: Secondary | ICD-10-CM | POA: Diagnosis not present

## 2016-09-27 DIAGNOSIS — Z62891 Sibling rivalry: Secondary | ICD-10-CM | POA: Diagnosis not present

## 2016-09-27 DIAGNOSIS — F4310 Post-traumatic stress disorder, unspecified: Secondary | ICD-10-CM | POA: Diagnosis not present

## 2016-09-28 ENCOUNTER — Emergency Department (HOSPITAL_BASED_OUTPATIENT_CLINIC_OR_DEPARTMENT_OTHER)
Admission: EM | Admit: 2016-09-28 | Discharge: 2016-09-28 | Disposition: A | Discharge status: Home / Self Care | Payer: Medicare Other | Attending: Emergency Medicine | Admitting: Emergency Medicine

## 2016-09-28 ENCOUNTER — Telehealth: Payer: Self-pay | Admitting: *Deleted

## 2016-09-28 ENCOUNTER — Emergency Department (HOSPITAL_BASED_OUTPATIENT_CLINIC_OR_DEPARTMENT_OTHER): Payer: Medicare Other

## 2016-09-28 ENCOUNTER — Encounter (HOSPITAL_BASED_OUTPATIENT_CLINIC_OR_DEPARTMENT_OTHER): Payer: Self-pay | Admitting: Emergency Medicine

## 2016-09-28 DIAGNOSIS — I10 Essential (primary) hypertension: Secondary | ICD-10-CM | POA: Insufficient documentation

## 2016-09-28 DIAGNOSIS — I2510 Atherosclerotic heart disease of native coronary artery without angina pectoris: Secondary | ICD-10-CM | POA: Insufficient documentation

## 2016-09-28 DIAGNOSIS — Z87891 Personal history of nicotine dependence: Secondary | ICD-10-CM | POA: Insufficient documentation

## 2016-09-28 DIAGNOSIS — M5417 Radiculopathy, lumbosacral region: Secondary | ICD-10-CM | POA: Diagnosis not present

## 2016-09-28 DIAGNOSIS — R200 Anesthesia of skin: Secondary | ICD-10-CM | POA: Diagnosis not present

## 2016-09-28 DIAGNOSIS — E119 Type 2 diabetes mellitus without complications: Secondary | ICD-10-CM | POA: Diagnosis not present

## 2016-09-28 MED ORDER — PREDNISONE 10 MG PO TABS
20.0000 mg | ORAL_TABLET | Freq: Two times a day (BID) | ORAL | 0 refills | Status: DC
Start: 2016-09-28 — End: 2016-11-06

## 2016-09-28 MED ORDER — VALSARTAN-HYDROCHLOROTHIAZIDE 160-25 MG PO TABS: 1.0000 | ORAL_TABLET | Freq: Every evening | ORAL | 1 refills | Status: DC

## 2016-09-28 MED ORDER — DILTIAZEM HCL ER BEADS 300 MG PO CP24: 300.0000 mg | ORAL_CAPSULE | Freq: Every morning | ORAL | 1 refills | Status: DC

## 2016-09-28 MED ORDER — PANTOPRAZOLE SODIUM 40 MG PO TBEC: 40.0000 mg | DELAYED_RELEASE_TABLET | Freq: Every day | ORAL | 1 refills | Status: DC

## 2016-09-28 MED ORDER — METFORMIN HCL 1000 MG PO TABS: 1000.0000 mg | ORAL_TABLET | Freq: Every day | ORAL | 1 refills | Status: DC

## 2016-09-28 MED ORDER — CLOPIDOGREL BISULFATE 75 MG PO TABS: 75.0000 mg | ORAL_TABLET | Freq: Every day | ORAL | 1 refills | Status: DC

## 2016-09-28 NOTE — ED Triage Notes (Signed)
R thigh numbness following testicular surgery on 10/9. Pt states he is concerned about a blood clot.

## 2016-09-28 NOTE — Telephone Encounter (Signed)
We have never refilled these medications before.  Please advise?    Received faxes from [facility] for refills on :  Diltiazem HCL ER 300mg  Clopidogrel 75mg  Valsartan/HCTZ 160/25mg  Famotidine 40mg .  WE HAVE PANTOPRAZOLE 40MG  ON MED LIST SENT 07/27/16. Metformin HCL 1000mg 

## 2016-09-28 NOTE — Telephone Encounter (Signed)
I sent refills on everything except famotidine, rx sent for protonix since famotidine was d/c'd.

## 2016-09-28 NOTE — Discharge Instructions (Signed)
Prednisone as prescribed.  Return to the emergency department if you're not improving in the next week, and return to the ER if symptoms significantly worsen or change in the meantime.

## 2016-09-28 NOTE — ED Notes (Signed)
MD at bedside. 

## 2016-09-28 NOTE — ED Notes (Signed)
Patient transported to Ultrasound 

## 2016-09-28 NOTE — ED Provider Notes (Signed)
[facility] Provider Note   CSN: [id number] Arrival date & time: 09/28/16  0844     History   Chief Complaint Chief Complaint  Patient presents with  . Numbness    HPI Steven Payne is a 70 y.o. male.  Patient is a 70 year old male with past medical history of hypertension and diabetes. He presents for evaluation of right leg numbness. This started several days ago and is worsening. He reports having difficulty feeling anything to the lateral aspect of the right 5. He denies any weakness.  He recently underwent surgery to drain a testicular cyst. This was performed by Dr. Karsten Ro approximately 2 weeks ago. The patient is concerned about the possibility of a blood clot. He denies any leg swelling, chest pain, or difficulty breathing.      Past Medical History:  Diagnosis Date  . Arthritis   . Coronary artery disease cardiologist-  dr Irish Lack   per cardiac cath 06/ 2013--  no sig. cad, lvef 60%, sluggish ectatic coronary artery flow of lad, lcx, rc  . ED (erectile dysfunction)   . GERD (gastroesophageal reflux disease)   . History of gout    approx 2002  . Hyperlipemia   . Hypertension   . Hypogonadism male   . Left hydrocele   . Type 2 diabetes mellitus [facility])     Patient Active Problem List   Diagnosis Date Noted  . Diabetes type 2, controlled ([place]) 02/23/2016  . HTN (hypertension) 02/23/2016  . Hyperlipidemia 02/23/2016  . Gout 02/23/2016  . GERD (gastroesophageal reflux disease) 02/23/2016  . CAD (coronary artery disease) 02/23/2016    Past Surgical History:  Procedure Laterality Date  . APPENDECTOMY  age 50's  . CARDIAC CATHETERIZATION  05-08-2012  dr Elonda Husky at [facility]   no significant cad, sluggish ectatic coronary artery flow (rca, lad, lcx), normal LVSF, ef 60%  . HYDROCELE EXCISION Left 09/10/2016   Procedure: HYDROCELECTOMY ADULT;  Surgeon: Kathie Rhodes, MD;  Location: [facility];  Service: Urology;  Laterality: Left;  .  UMBILICAL HERNIA REPAIR  1980's       Home Medications    Prior to Admission medications   Medication Sig Start Date End Date Taking? Authorizing Provider  atorvastatin (LIPITOR) 40 MG tablet TAKE 1 TABLET DAILY Patient taking differently: TAKE 1 TABLET DAILY-- takes in pm 08/13/16   [name], NP  clopidogrel (PLAVIX) 75 MG tablet Take 75 mg by mouth daily.    Historical Provider, MD  diltiazem (TIAZAC) 300 MG 24 hr capsule Take 300 mg by mouth every morning.     Historical Provider, MD  metFORMIN (GLUCOPHAGE) 1000 MG tablet Take 1,000 mg by mouth daily with breakfast.     Historical Provider, MD  Oxycodone HCl 10 MG TABS Take 1 tablet (10 mg total) by mouth every 4 (four) hours as needed. 09/10/16   Kathie Rhodes, MD  pantoprazole (PROTONIX) 40 MG tablet Take 1 tablet (40 mg total) by mouth daily. Patient taking differently: Take 40 mg by mouth every morning.  07/27/16   [name], NP  tadalafil (CIALIS) 10 MG tablet Take 10 mg by mouth as needed for erectile dysfunction.    Historical Provider, MD  valsartan-hydrochlorothiazide (DIOVAN-HCT) 160-25 MG tablet Take 1 tablet by mouth every evening.     Historical Provider, MD    Family History Family History  Problem Relation Age of Onset  . Coronary artery disease Father   . Diabetes Father   . Prostate cancer Father  died at 68  . Alcohol abuse Father   . Coronary artery disease Mother     died age 32  . Alcohol abuse Mother   . Cancer Mother     unsure, + thyroid cancer  . Breast cancer Paternal Grandmother     Social History Social History  Substance Use Topics  . Smoking status: Former Smoker    Years: 1.00    Quit date: 03/27/1991  . Smokeless tobacco: Never Used  . Alcohol use No     Allergies   Tanzeum [albiglutide]   Review of Systems Review of Systems  All other systems reviewed and are negative.    Physical Exam Updated Vital Signs BP 131/76 (BP Location: Right Arm)   Pulse 62    Temp 98.6 F (37 C) (Oral)   Resp 20   Ht 5\' 7"  (1.702 m)   Wt 273 lb (123.8 kg)   SpO2 100%   BMI 42.76 kg/m   Physical Exam  Constitutional: He is oriented to person, place, and time. He appears well-developed and well-nourished. No distress.  HENT:  Head: Normocephalic and atraumatic.  Mouth/Throat: Oropharynx is clear and moist.  Neck: Normal range of motion. Neck supple.  Cardiovascular: Normal rate and regular rhythm.  Exam reveals no friction rub.   No murmur heard. Pulmonary/Chest: Effort normal and breath sounds normal. No respiratory distress. He has no wheezes. He has no rales.  Abdominal: Soft. Bowel sounds are normal. He exhibits no distension. There is no tenderness.  Musculoskeletal: Normal range of motion. He exhibits no edema.  The right leg appears grossly normal. There is no significant swelling or rash. There is no calf tenderness or edema. Homans sign is absent bilaterally.  Neurological: He is alert and oriented to person, place, and time. Coordination normal.  DTRs are 1+ and symmetrical in both lower extremities. Strength is 5 out of 5 in the bilateral lower extremities.  He describes a difference in sensation with palpation of the lateral aspect of the right thigh beginning at the hip and extending just above the knee.  Skin: Skin is warm and dry. He is not diaphoretic.  Nursing note and vitals reviewed.    ED Treatments / Results  Labs (all labs ordered are listed, but only abnormal results are displayed) Labs Reviewed - No data to display  EKG  EKG Interpretation None       Radiology No results found.  Procedures Procedures (including critical care time)  Medications Ordered in ED Medications - No data to display   Initial Impression / Assessment and Plan / ED Course  I have reviewed the triage vital signs and the nursing notes.  Pertinent labs & imaging results that were available during my care of the patient were reviewed by me and  considered in my medical decision making (see chart for details).  Clinical Course    Patient presents with numbness to the lateral right thigh in the absence of any injury or trauma. His legs are neurovascularly intact and ultrasound shows no evidence for DVT. Although he is experiencing no low back pain, I suspect a radicular cause. He will be treated with prednisone, rest, and when necessary follow-up with his primary Dr. if not improving.  Final Clinical Impressions(s) / ED Diagnoses   Final diagnoses:  None    New Prescriptions New Prescriptions   No medications on file     Veryl Speak, MD 09/28/16 1017

## 2016-11-06 ENCOUNTER — Encounter: Payer: Self-pay | Admitting: Family

## 2016-11-06 ENCOUNTER — Ambulatory Visit (INDEPENDENT_AMBULATORY_CARE_PROVIDER_SITE_OTHER): Payer: Medicare Other | Admitting: Family

## 2016-11-06 VITALS — BP 132/60 | HR 67 | Temp 98.0°F | Resp 16 | Ht 67.0 in | Wt 275.8 lb

## 2016-11-06 DIAGNOSIS — Z23 Encounter for immunization: Secondary | ICD-10-CM

## 2016-11-06 DIAGNOSIS — M5417 Radiculopathy, lumbosacral region: Secondary | ICD-10-CM | POA: Diagnosis not present

## 2016-11-06 DIAGNOSIS — E785 Hyperlipidemia, unspecified: Secondary | ICD-10-CM | POA: Diagnosis not present

## 2016-11-06 DIAGNOSIS — E119 Type 2 diabetes mellitus without complications: Secondary | ICD-10-CM | POA: Diagnosis not present

## 2016-11-06 DIAGNOSIS — I10 Essential (primary) hypertension: Secondary | ICD-10-CM

## 2016-11-06 DIAGNOSIS — M5416 Radiculopathy, lumbar region: Secondary | ICD-10-CM

## 2016-11-06 DIAGNOSIS — I2510 Atherosclerotic heart disease of native coronary artery without angina pectoris: Secondary | ICD-10-CM | POA: Diagnosis not present

## 2016-11-06 LAB — LIPID PANEL
Cholesterol: 158 mg/dL (ref 0–200)
HDL: 36.1 mg/dL — ABNORMAL LOW (ref >39.00)
LDL Cholesterol: 101 mg/dL — ABNORMAL HIGH (ref 0–99)
NonHDL: 122.3
Total CHOL/HDL Ratio: 4
Triglycerides: 107 mg/dL (ref 0.0–149.0)
VLDL: 21.4 mg/dL (ref 0.0–40.0)

## 2016-11-06 LAB — BASIC METABOLIC PANEL
BUN: 12 mg/dL (ref 6–23)
CO2: 26 mEq/L (ref 19–32)
Calcium: 9.6 mg/dL (ref 8.4–10.5)
Chloride: 103 mEq/L (ref 96–112)
Creatinine, Ser: 0.89 mg/dL (ref 0.40–1.50)
GFR: 108.04 mL/min (ref >60.00)
Glucose, Bld: 111 mg/dL — ABNORMAL HIGH (ref 70–99)
Potassium: 3.7 mEq/L (ref 3.5–5.1)
Sodium: 142 mEq/L (ref 135–145)

## 2016-11-06 LAB — HEMOGLOBIN A1C: Hgb A1c MFr Bld: 6.6 % — ABNORMAL HIGH (ref 4.6–6.5)

## 2016-11-06 NOTE — Assessment & Plan Note (Signed)
Tolerating statin, obtain flp 

## 2016-11-06 NOTE — Assessment & Plan Note (Signed)
Stable on current medications.  Obtain A1C.

## 2016-11-06 NOTE — Patient Instructions (Signed)
Please complete lab work prior to leaving. You will be contacted about scheduling your MRI.

## 2016-11-06 NOTE — Progress Notes (Signed)
Pre visit review using our clinic review tool, if applicable. No additional management support is needed unless otherwise documented below in the visit note. 

## 2016-11-06 NOTE — Addendum Note (Signed)
Addended by: Kelle Darting A on: 11/06/2016 08:21 AM   Modules accepted: Orders

## 2016-11-06 NOTE — Assessment & Plan Note (Signed)
BP stable on current meds, continue same.  

## 2016-11-06 NOTE — Progress Notes (Signed)
Subjective:    Patient ID: Steven Payne, male    DOB: 1946-07-08, 70 y.o.   MRN: [id number]  HPI  Steven Payne is a 70 yr old male who presents today with chief complaint of right leg pain. The patient underwent drainage of a testicular cyst by Dr. Karsten Ro on 09/10/16. He presented to the ED on 09/28/16 (ED note is revieHe denies testicular pain or swelling. wed) and was felt to have lumbar radiculopathy.  He was treated with prednisone and rest.    Reports that he did not get relief from the prednisone but it cause him HA.  Reports that his right leg pain is intermittent and radiates down the right leg.  Pain has been occurring since about 09/21/16. He denies weakness in the right leg.  He reports some numbness in the right lateral thigh. Denies bowl/bladder incontinence.    Hyperlipidemia- reports good compliance with statin, denies myalgia.  HTN- continues diovan HCT.   DM2- continues metformin.   Review of Systems See HPI  Past Medical History:  Diagnosis Date  . Arthritis   . Coronary artery disease cardiologist-  dr Irish Lack   per cardiac cath 06/ 2013--  no sig. cad, lvef 60%, sluggish ectatic coronary artery flow of lad, lcx, rc  . ED (erectile dysfunction)   . GERD (gastroesophageal reflux disease)   . History of gout    approx 2002  . Hyperlipemia   . Hypertension   . Hypogonadism male   . Left hydrocele   . Type 2 diabetes mellitus ([place])      Social History   Social History  . Marital status: Married    Spouse name: N/A  . Number of children: N/A  . Years of education: N/A   Occupational History  . Not on file.   Social History Main Topics  . Smoking status: Former Smoker    Years: 1.00    Quit date: 03/27/1991  . Smokeless tobacco: Never Used  . Alcohol use No  . Drug use: No  . Sexual activity: Not on file   Other Topics Concern  . 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  No grandchildren.  Oldest in [place], youngest in [place]   Completed bachelors, some graduate (Estate manager/land agent)   Enjoys travelling, walking, cruises    Past Surgical History:  Procedure Laterality Date  . APPENDECTOMY  age 70's  . CARDIAC CATHETERIZATION  05-08-2012  dr Elonda Husky at [facility]   no significant cad, sluggish ectatic coronary artery flow (rca, lad, lcx), normal LVSF, ef 60%  . HYDROCELE EXCISION Left 09/10/2016   Procedure: HYDROCELECTOMY ADULT;  Surgeon: Kathie Rhodes, MD;  Location: [facility];  Service: Urology;  Laterality: Left;  . UMBILICAL HERNIA REPAIR  1980's    Family History  Problem Relation Age of Onset  . Coronary artery disease Father   . Diabetes Father   . Prostate cancer Father     died at 98  . Alcohol abuse Father   . Coronary artery disease Mother     died age 66  . Alcohol abuse Mother   . Cancer Mother     unsure, + thyroid cancer  . Breast cancer Paternal Grandmother     Allergies  Allergen Reactions  . Tanzeum [Albiglutide] Other (See Comments)  constipation    Current Outpatient Prescriptions on File Prior to Visit  Medication Sig Dispense Refill  . atorvastatin (LIPITOR) 40 MG tablet TAKE 1 TABLET DAILY (Patient taking differently: TAKE 1 TABLET DAILY-- takes in pm) 90 tablet 1  . clopidogrel (PLAVIX) 75 MG tablet Take 1 tablet (75 mg total) by mouth daily. 90 tablet 1  . diltiazem (TIAZAC) 300 MG 24 hr capsule Take 1 capsule (300 mg total) by mouth every morning. 90 capsule 1  . metFORMIN (GLUCOPHAGE) 1000 MG tablet Take 1 tablet (1,000 mg total) by mouth daily with breakfast. 90 tablet 1  . pantoprazole (PROTONIX) 40 MG tablet Take 1 tablet (40 mg total) by mouth daily. 90 tablet 1  . predniSONE (DELTASONE) 10 MG tablet Take 2 tablets (20 mg total) by mouth 2 (two) times daily. 20 tablet 0  . tadalafil (CIALIS) 10 MG tablet Take 10 mg by mouth  as needed for erectile dysfunction.    . valsartan-hydrochlorothiazide (DIOVAN-HCT) 160-25 MG tablet Take 1 tablet by mouth every evening. 90 tablet 1  . Oxycodone HCl 10 MG TABS Take 1 tablet (10 mg total) by mouth every 4 (four) hours as needed. (Patient not taking: Reported on 11/06/2016) 30 tablet 0   No current facility-administered medications on file prior to visit.     BP 132/60 (BP Location: Left Arm, Patient Position: Sitting, Cuff Size: Large)   Pulse 67   Temp 98 F (36.7 C) (Oral)   Resp 16   Ht 5\' 7"  (1.702 m)   Wt 275 lb 12.8 oz (125.1 kg)   SpO2 98%   BMI 43.20 kg/m       Objective:   Physical Exam  Constitutional: He is oriented to person, place, and time. He appears well-developed and well-nourished. No distress.  HENT:  Head: Normocephalic and atraumatic.  Cardiovascular: Normal rate and regular rhythm.   No murmur heard. Pulmonary/Chest: Effort normal and breath sounds normal. No respiratory distress. He has no wheezes. He has no rales.  Musculoskeletal: He exhibits no edema.  Neurological: He is alert and oriented to person, place, and time.  Diminished sensation to monofilament noted in L4 distribution  Skin: Skin is warm and dry.  Psychiatric: He has a normal mood and affect. His behavior is normal. Thought content normal.          Assessment & Plan:  Prevnar and Td given today.  Low back pain with radiculopathy- give duration of 6 weeks, will send for MRI of the lumbar spine. Further recommendations pending review of MRI. He has been taking advil for pain- advised pt to d/c advil, and switch to tylenol.

## 2016-11-08 ENCOUNTER — Ambulatory Visit (HOSPITAL_BASED_OUTPATIENT_CLINIC_OR_DEPARTMENT_OTHER)
Admission: RE | Admit: 2016-11-08 | Discharge: 2016-11-08 | Disposition: A | Discharge status: Home / Self Care | Payer: Medicare Other | Source: Ambulatory Visit | Attending: Family | Admitting: Family

## 2016-11-08 DIAGNOSIS — M545 Low back pain: Secondary | ICD-10-CM | POA: Diagnosis not present

## 2016-11-08 DIAGNOSIS — M5417 Radiculopathy, lumbosacral region: Secondary | ICD-10-CM | POA: Diagnosis not present

## 2016-11-08 DIAGNOSIS — M5416 Radiculopathy, lumbar region: Secondary | ICD-10-CM

## 2016-11-08 DIAGNOSIS — M47896 Other spondylosis, lumbar region: Secondary | ICD-10-CM | POA: Diagnosis not present

## 2016-11-09 ENCOUNTER — Telehealth: Payer: Self-pay | Admitting: Family

## 2016-11-09 DIAGNOSIS — M5136 Other intervertebral disc degeneration, lumbar region: Secondary | ICD-10-CM

## 2016-11-09 NOTE — Telephone Encounter (Signed)
Left detailed message on pt's cell# and to call if any questions. Sent mychart message to pt.

## 2016-11-09 NOTE — Telephone Encounter (Signed)
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 I would recommend PT referral for his back pain.

## 2016-11-12 ENCOUNTER — Encounter: Payer: Self-pay | Admitting: Family

## 2016-11-13 ENCOUNTER — Other Ambulatory Visit: Payer: Self-pay | Admitting: Family

## 2016-11-13 MED ORDER — GABAPENTIN 100 MG PO CAPS: 100.0000 mg | ORAL_CAPSULE | Freq: Three times a day (TID) | ORAL | 3 refills | Status: DC

## 2016-11-15 DIAGNOSIS — H524 Presbyopia: Secondary | ICD-10-CM | POA: Diagnosis not present

## 2016-11-15 DIAGNOSIS — H52221 Regular astigmatism, right eye: Secondary | ICD-10-CM | POA: Diagnosis not present

## 2016-11-15 DIAGNOSIS — E119 Type 2 diabetes mellitus without complications: Secondary | ICD-10-CM | POA: Diagnosis not present

## 2016-11-15 DIAGNOSIS — H52222 Regular astigmatism, left eye: Secondary | ICD-10-CM | POA: Diagnosis not present

## 2016-11-15 DIAGNOSIS — H5212 Myopia, left eye: Secondary | ICD-10-CM | POA: Diagnosis not present

## 2016-11-15 DIAGNOSIS — H2513 Age-related nuclear cataract, bilateral: Secondary | ICD-10-CM | POA: Diagnosis not present

## 2016-11-15 LAB — HM DIABETES EYE EXAM

## 2016-11-16 ENCOUNTER — Encounter: Payer: Self-pay | Admitting: Family

## 2016-11-22 ENCOUNTER — Ambulatory Visit: Payer: Medicare Other | Attending: Family | Admitting: Physical Therapy

## 2016-11-22 ENCOUNTER — Telehealth: Payer: Self-pay | Admitting: Family

## 2016-11-22 DIAGNOSIS — M5417 Radiculopathy, lumbosacral region: Secondary | ICD-10-CM | POA: Diagnosis not present

## 2016-11-22 DIAGNOSIS — M6281 Muscle weakness (generalized): Secondary | ICD-10-CM | POA: Diagnosis not present

## 2016-11-22 DIAGNOSIS — M79604 Pain in right leg: Secondary | ICD-10-CM | POA: Insufficient documentation

## 2016-11-22 NOTE — Therapy (Signed)
[facility] [address]  [place], [place], [address] Phone: [phone]   Fax:  [phone]  Physical Therapy Evaluation  Patient Details  Name: Steven Payne MRN: [id number] Date of Birth: 05-20-1946 Referring Provider: [name], NP  Encounter Date: 11/22/2016      PT End of Session - 11/22/16 0933    Visit Number 1   Number of Visits 12   Date for PT Re-Evaluation 01/18/17   Authorization Type Medicare/Tricare - PT only   PT Start Time 0933   PT Stop Time 1017   PT Time Calculation (min) 44 min   Activity Tolerance Patient tolerated treatment well   Behavior During Therapy [facility] for tasks assessed/performed      Past Medical History:  Diagnosis Date  . Arthritis   . Coronary artery disease cardiologist-  dr Irish Lack   per cardiac cath 06/ 2013--  no sig. cad, lvef 60%, sluggish ectatic coronary artery flow of lad, lcx, rc  . ED (erectile dysfunction)   . GERD (gastroesophageal reflux disease)   . History of gout    approx 2002  . Hyperlipemia   . Hypertension   . Hypogonadism male   . Left hydrocele   . Type 2 diabetes mellitus ([place])     Past Surgical History:  Procedure Laterality Date  . APPENDECTOMY  age 34's  . CARDIAC CATHETERIZATION  05-08-2012  dr Elonda Husky at [facility]   no significant cad, sluggish ectatic coronary artery flow (rca, lad, lcx), normal LVSF, ef 60%  . HYDROCELE EXCISION Left 09/10/2016   Procedure: HYDROCELECTOMY ADULT;  Surgeon: Kathie Rhodes, MD;  Location[facility];  Service: Urology;  Laterality: Left;  . UMBILICAL HERNIA REPAIR  1980's    There were no vitals filed for this visit.       Subjective Assessment - 11/22/16 0940    Subjective Pt reports deep throbbing pain in R lateral thigh moving toward anterior thigh which he states NP thinks is coming from his back. Denies LBP but does note numbness and tingling in the R LE in the same location as  the pain. States walking helps alleviate the pain.   Limitations Sitting   How long can you sit comfortably? <30 minutes   Diagnostic tests MRI Lumbar spine 11/08/16 - Mild degenerative changes for age, mainly facet spurring. No impingement to explain leg symptoms.   Patient Stated Goals "To get rid of or lessen the pain"   Currently in Pain? No/denies   Pain Score 0-No pain  up to 5/10 at worst   Pain Location Leg   Pain Orientation Right;Upper;Lateral   Pain Descriptors / Indicators Sore;Numbness   Pain Type Acute pain   Pain Onset More than a month ago  after surgery on Oct 9 for removal of cyst from L testicle    Pain Frequency Constant   Aggravating Factors  prolonged sitting or standing   Pain Relieving Factors walking, pain meds (make him groggy)            [facility] PT Assessment - 11/22/16 0933      Assessment   Medical Diagnosis Lumbar DDD   Referring Provider [name], NP   Onset Date/Surgical Date 09/10/16   Next MD Visit 02/04/17   Prior Therapy none     Balance Screen   Has the patient fallen in the past 6 months No   Has the patient had a decrease in activity level because of  a fear of falling?  No   Is the patient reluctant to leave their home because of a fear of falling?  No     Home Environment   Living Environment Private residence   Type of [place] Access Level entry;Stairs to enter  3 step at front door (not used often)   Home Layout One level     Prior Function   Level of Independence Independent   Vocation Full time employment   Vocation Requirements Mostly sedentary with driving, repair equipment   Leisure Mostly sedentary, gym 3x/wk - walking on TM, free weights     Observation/Other Assessments   Focus on Therapeutic Outcomes (FOTO)  Lumbar spine - 72% (28% limitation); predicted 78% (22% limitation)     ROM / Strength   AROM / PROM / Strength AROM;Strength     AROM   Overall AROM  Within functional limits for tasks  performed   Overall AROM Comments w/o pain   AROM Assessment Site Lumbar     Strength   Strength Assessment Site Hip   Right/Left Hip Right;Left   Right Hip Flexion 4/5   Right Hip Extension 4-/5   Right Hip ABduction 4/5   Right Hip ADduction 4/5   Left Hip Flexion 4+/5   Left Hip Extension 4/5   Left Hip ABduction 4+/5   Left Hip ADduction 4+/5     Flexibility   Soft Tissue Assessment /Muscle Length yes   Hamstrings mild/mod tight L>R   Quadriceps mild/mod tight R>L   ITB mild/mod tight R>L   Piriformis mod/severely tight R>L     Palpation   Palpation comment ttp with increased tension in R ITB & piriformis     Special Tests    Special Tests Lumbar   Lumbar Tests FABER test;Straight Leg Raise     FABER test   findings Negative   Comment other than very tight     Straight Leg Raise   Findings Negative                   [facility] Adult PT Treatment/Exercise - 11/22/16 0933      Exercises   Exercises Lumbar     Lumbar Exercises: Stretches   Passive Hamstring Stretch 30 seconds;2 reps   Passive Hamstring Stretch Limitations supine with strap   Hip Flexor Stretch 30 seconds;2 reps   Hip Flexor Stretch Limitations supine mod thomas with strap   ITB Stretch 30 seconds;4 reps   ITB Stretch Limitations supine with strap + standing w/ trunk lateral flexion (2 reps each)   Piriformis Stretch 30 seconds;4 reps   Piriformis Stretch Limitations supine figure 4 with strap + KTOS (2 reps each)                PT Education - 11/22/16 1017    Education provided Yes   Education Details PT eval findings, POC and initial HEP   Person(s) Educated Patient   Methods Explanation;Demonstration;Handout   Comprehension Verbalized understanding;Returned demonstration;Need further instruction          PT Short Term Goals - 11/22/16 1017      PT SHORT TERM GOAL #1   Title Independent with inital HEP by 12/14/16   Status New           PT Long Term Goals -  11/22/16 1017      PT LONG TERM GOAL #1   Title Independent with advanced HEP/gym program by 01/18/17   Status  New     PT LONG TERM GOAL #2   Title Pt will report ability to sit >/= 45 minutes w/o onset or worsening of R LE pain or numbness by 01/18/17   Status New     PT LONG TERM GOAL #3   Title Pt will report 50% or greater improvement in R LE pain/numbess by 01/18/17   Status New               Plan - 12/21/16 1017    Clinical Impression Statement Avidan is a 70 y/o male with c/o R lateral thigh pain, numbness and tingling presumed to be of lumbar radiculopathy origin. Pt denies any LBP thus far, and states R LE pain only intermittent up to 5/10 at worst, typically after sitting for prolonged periods, with no pain at time of eval. Assessment revealed lumbar ROM essentially WNL in all directions but flexibility assessment reveals mild to moderate proximal LE tightness in B HS, ITB, hip flexors and quads especially RF, with mod to severe tightness in B piriformis, R>L. Pt ttp over R piriformis & ITB. Proximal LE strength assessment reveals mild weakness in R hip vs L (refer to above MMT). Given muscle imbalance and tightness with lack of LBP or symptoms, radicular symptoms may be in part due to piriformis syndrome. POC will focus on improving LE soft tissue pliability along with core/proximal stability training, postural training with emphasis on neutral spine alignment including proper lifting mechanics, LE strengthening and manual therapy/modalities PRN for pain. May consider mechanical traction if radicular symptoms persist and/or dry needling or kinesiotaping for pain/increased muscle tension.   Rehab Potential Good   Clinical Impairments Affecting Rehab Potential morbid obesity, HTN   PT Frequency 2x / week   PT Duration 6 weeks   PT Treatment/Interventions Patient/family education;Therapeutic exercise;Neuromuscular re-education;Manual techniques;Taping;Dry needling;Therapeutic  activities;Functional mobility training;Electrical Stimulation;Moist Heat;Cryotherapy;Traction;Ultrasound;ADLs/Self Care Home Management   PT Next Visit Plan Review initial stretching HEP; Initiate lumbar/proximal LE strenthening/stabilization; Manual therapy & modalities PRN   Consulted and Agree with Plan of Care Patient      Patient will benefit from skilled therapeutic intervention in order to improve the following deficits and impairments:  Pain, Impaired flexibility, Increased muscle spasms, Postural dysfunction, Increased fascial restricitons, Decreased strength, Decreased activity tolerance  Visit Diagnosis: Radiculopathy, lumbosacral region  Pain in right leg  Muscle weakness (generalized)      G-Codes - 12-21-2016 1017    Functional Assessment Tool Used Lumbar spine - 72% (28% limitation)   Functional Limitation Changing and maintaining body position   Changing and Maintaining Body Position Current Status [id number]) At least 20 percent but less than 40 percent impaired, limited or restricted   Changing and Maintaining Body Position Goal Status [id number]) At least 20 percent but less than 40 percent impaired, limited or restricted       Problem List Patient Active Problem List   Diagnosis Date Noted  . Diabetes type 2, controlled ([place]) 02/23/2016  . HTN (hypertension) 02/23/2016  . Hyperlipidemia 02/23/2016  . Gout 02/23/2016  . GERD (gastroesophageal reflux disease) 02/23/2016  . CAD (coronary artery disease) 02/23/2016    Percival Spanish, PT, MPT 12/21/16, 1:56 PM  [facility] [address]  [place], [place], [address] Phone: [phone]   Fax:  [phone]  Name: Steven Payne MRN: [id number] Date of Birth: March 04, 1946

## 2016-11-22 NOTE — Telephone Encounter (Signed)
  Relation to [id number] Call back number: [phone] Pharmacy:  Reason for call:  Pt states that the rx gabapentin (NEURONTIN) 100 MG capsule, makes him very sleepy and drowsy would like to know if there is an alternative for him to take because he has to work.

## 2016-11-23 MED ORDER — TRAMADOL HCL 50 MG PO TABS: 50.0000 mg | ORAL_TABLET | Freq: Three times a day (TID) | ORAL | 0 refills | Status: DC | PRN

## 2016-11-23 NOTE — Telephone Encounter (Signed)
Pt notified of instructions and verbalized understanding. 

## 2016-11-23 NOTE — Telephone Encounter (Signed)
Notified pt. He states that he is going to PT and he thinks it is going to help. States pain usually gets worse at night. Has been taking tylenol 500mg , 2 tablets three times a day along with gabapentin. Wants to know how much tylenol is safe to take a day. Should he stop tylenol while taking Tramadol? Rx faxed to pharmacy. Please advise?

## 2016-11-23 NOTE — Telephone Encounter (Signed)
Ok to take tylenol with tramadol. Max tyleonl 3000mg /day.

## 2016-11-23 NOTE — Telephone Encounter (Signed)
D/c gabapentin. I gave a 1 time rx for tramadol. When this is done, he should switch back to tylenol.

## 2016-12-05 ENCOUNTER — Ambulatory Visit: Payer: Medicare Other | Attending: Family | Admitting: Physical Therapy

## 2016-12-05 DIAGNOSIS — M79604 Pain in right leg: Secondary | ICD-10-CM | POA: Diagnosis not present

## 2016-12-05 DIAGNOSIS — M6281 Muscle weakness (generalized): Secondary | ICD-10-CM | POA: Diagnosis not present

## 2016-12-05 DIAGNOSIS — M5417 Radiculopathy, lumbosacral region: Secondary | ICD-10-CM

## 2016-12-05 NOTE — Therapy (Signed)
[facility] [address]  [place], [place], [address] Phone: [phone]   Fax:  [phone]  Physical Therapy Treatment  Patient Details  Name: Steven Payne MRN: [id number] Date of Birth: [birth year] Referring Provider: [name], NP  Encounter Date: 12/05/2016      PT End of Session - 12/05/16 0803    Visit Number 2   Number of Visits 12   Date for PT Re-Evaluation 01/18/17   Authorization Type Medicare/Tricare - PT only   PT Start Time 0800   PT Stop Time 0841   PT Time Calculation (min) 41 min   Activity Tolerance Patient tolerated treatment well   Behavior During Therapy [facility] for tasks assessed/performed      Past Medical History:  Diagnosis Date  . Arthritis   . Coronary artery disease cardiologist-  dr Irish Lack   per cardiac cath 06/ 2013--  no sig. cad, lvef 60%, sluggish ectatic coronary artery flow of lad, lcx, rc  . ED (erectile dysfunction)   . GERD (gastroesophageal reflux disease)   . History of gout    approx 2002  . Hyperlipemia   . Hypertension   . Hypogonadism male   . Left hydrocele   . Type 2 diabetes mellitus ([place])     Past Surgical History:  Procedure Laterality Date  . APPENDECTOMY  age 49's  . CARDIAC CATHETERIZATION  05-08-2012  dr Elonda Husky at [facility]   no significant cad, sluggish ectatic coronary artery flow (rca, lad, lcx), normal LVSF, ef 60%  . HYDROCELE EXCISION Left 09/10/2016   Procedure: HYDROCELECTOMY ADULT;  Surgeon: Kathie Rhodes, MD;  Location: [facility];  Service: Urology;  Laterality: Left;  . UMBILICAL HERNIA REPAIR  1980's    There were no vitals filed for this visit.      Subjective Assessment - 12/05/16 0802    Subjective Patient reporting mild pain of R LE - has only been doing stretches to R LE    Diagnostic tests MRI Lumbar spine 11/08/16 - Mild degenerative changes for age, mainly facet spurring. No impingement to explain leg  symptoms.   Patient Stated Goals "To get rid of or lessen the pain"   Currently in Pain? Yes   Pain Score 2    Pain Location Leg   Pain Orientation Right   Pain Descriptors / Indicators Sore;Numbness   Pain Type Acute pain   Pain Onset More than a month ago   Pain Frequency Constant   Aggravating Factors  prolonged sitting or standing   Pain Relieving Factors walking, pain meds (make him groggy)                         [facility] Adult PT Treatment/Exercise - 12/05/16 0805      Exercises   Exercises Knee/Hip     Lumbar Exercises: Supine   Ab Set 15 reps;5 seconds   AB Set Limitations hooklying     Knee/Hip Exercises: Stretches   Passive Hamstring Stretch Both;3 reps;30 seconds   Passive Hamstring Stretch Limitations supine with strap   Hip Flexor Stretch Both;3 reps;30 seconds   Hip Flexor Stretch Limitations 1/2 kneeling   ITB Stretch Both;3 reps;30 seconds   ITB Stretch Limitations supine with strap   Piriformis Stretch Both;3 reps;30 seconds     Knee/Hip Exercises: Aerobic   Nustep level 5 x 8 minutes     Knee/Hip Exercises: Supine   Bridges Strengthening;Both;15 reps  Bridges Limitations 5 second hold - with glute squeeze   Straight Leg Raises Strengthening;Both;15 reps   Straight Leg Raises Limitations 2#     Knee/Hip Exercises: Sidelying   Clams B LE 2 x 15 - green tband at knees                  PT Short Term Goals - 11/22/16 1017      PT SHORT TERM GOAL #1   Title Independent with inital HEP by 12/14/16   Status New           PT Long Term Goals - 11/22/16 1017      PT LONG TERM GOAL #1   Title Independent with advanced HEP/gym program by 01/18/17   Status New     PT LONG TERM GOAL #2   Title Pt will report ability to sit >/= 45 minutes w/o onset or worsening of R LE pain or numbness by 01/18/17   Status New     PT LONG TERM GOAL #3   Title Pt will report 50% or greater improvement in R LE pain/numbess by 01/18/17   Status  New               Plan - 12/05/16 0803    Clinical Impression Statement Patient doing well today - has been compliant with HEP, however has only been stretcing R LE rather than both. Review of HEP today with additions of gentle strengthening with patient reporting 0/10 pain by end of treatment session. Patient tolerating all stretching and strengthening tasks with appropriate muscle action and fatigue. Patient to continue to benefit from PT to maximize function.    Clinical Impairments Affecting Rehab Potential morbid obesity, HTN   PT Treatment/Interventions Patient/family education;Therapeutic exercise;Neuromuscular re-education;Manual techniques;Taping;Dry needling;Therapeutic activities;Functional mobility training;Electrical Stimulation;Moist Heat;Cryotherapy;Traction;Ultrasound;ADLs/Self Care Home Management   PT Next Visit Plan Review initial stretching HEP; Initiate lumbar/proximal LE strenthening/stabilization; Manual therapy & modalities PRN   Consulted and Agree with Plan of Care Patient      Patient will benefit from skilled therapeutic intervention in order to improve the following deficits and impairments:  Pain, Impaired flexibility, Increased muscle spasms, Postural dysfunction, Increased fascial restricitons, Decreased strength, Decreased activity tolerance  Visit Diagnosis: Radiculopathy, lumbosacral region  Pain in right leg  Muscle weakness (generalized)     Problem List Patient Active Problem List   Diagnosis Date Noted  . Diabetes type 2, controlled ([place]) 02/23/2016  . HTN (hypertension) 02/23/2016  . Hyperlipidemia 02/23/2016  . Gout 02/23/2016  . GERD (gastroesophageal reflux disease) 02/23/2016  . CAD (coronary artery disease) 02/23/2016     Lanney Gins, PT, DPT 12/05/16 9:58 AM  [facility] [address]  [place], [place], [address] Phone: [phone]   Fax:   [phone]  Name: Steven Payne MRN: [id number] Date of Birth: 1946/02/19

## 2016-12-05 NOTE — Patient Instructions (Signed)
Bridging    Slowly raise buttocks from floor, keeping stomach tight. Repeat _15_ times per set.    Straight Leg Raise    Tighten stomach and slowly raise locked right leg _6___ inches from floor. Repeat __15__ times per set.     Clam Shell 45 Degrees    Lying with hips and knees bent 45, one pillow between knees and ankles. Lift knee. Be sure pelvis does not roll backward. Do not arch back. Do _15__ times, each leg

## 2016-12-07 ENCOUNTER — Ambulatory Visit: Payer: Medicare Other | Admitting: Physical Therapy

## 2016-12-12 ENCOUNTER — Ambulatory Visit: Payer: Medicare Other | Admitting: Physical Therapy

## 2016-12-12 DIAGNOSIS — M6281 Muscle weakness (generalized): Secondary | ICD-10-CM | POA: Diagnosis not present

## 2016-12-12 DIAGNOSIS — M79604 Pain in right leg: Secondary | ICD-10-CM

## 2016-12-12 DIAGNOSIS — M5417 Radiculopathy, lumbosacral region: Secondary | ICD-10-CM

## 2016-12-12 NOTE — Therapy (Signed)
[facility] [address]  [place], [place], [address] Phone: [phone]   Fax:  [phone]  Physical Therapy Treatment  Patient Details  Name: NYGEL FARRIOR MRN: [id number] Date of Birth: [birth year] Referring Provider: [name], NP  Encounter Date: 12/12/2016      PT End of Session - 12/12/16 0801    Visit Number 3   Number of Visits 12   Date for PT Re-Evaluation 01/18/17   Authorization Type Medicare/Tricare - PT only   PT Start Time 0801   PT Stop Time 0844   PT Time Calculation (min) 43 min   Activity Tolerance Patient tolerated treatment well   Behavior During Therapy [facility] for tasks assessed/performed      Past Medical History:  Diagnosis Date  . Arthritis   . Coronary artery disease cardiologist-  dr Irish Lack   per cardiac cath 06/ 2013--  no sig. cad, lvef 60%, sluggish ectatic coronary artery flow of lad, lcx, rc  . ED (erectile dysfunction)   . GERD (gastroesophageal reflux disease)   . History of gout    approx 2002  . Hyperlipemia   . Hypertension   . Hypogonadism male   . Left hydrocele   . Type 2 diabetes mellitus ([place])     Past Surgical History:  Procedure Laterality Date  . APPENDECTOMY  age 71's  . CARDIAC CATHETERIZATION  05-08-2012  dr Elonda Husky at [facility]   no significant cad, sluggish ectatic coronary artery flow (rca, lad, lcx), normal LVSF, ef 60%  . HYDROCELE EXCISION Left 09/10/2016   Procedure: HYDROCELECTOMY ADULT;  Surgeon: Kathie Rhodes, MD;  Location: [facility];  Service: Urology;  Laterality: Left;  . UMBILICAL HERNIA REPAIR  1980's    There were no vitals filed for this visit.      Subjective Assessment - 12/12/16 0803    Subjective Pt stating "this therapy is really wokring", hasn't needed to take pain medicine in 3 days and reporting no pain, only mild soreness today.   Diagnostic tests MRI Lumbar spine 11/08/16 - Mild degenerative changes for  age, mainly facet spurring. No impingement to explain leg symptoms.   Patient Stated Goals "To get rid of or lessen the pain"   Currently in Pain? No/denies   Pain Onset More than a month ago                         [facility] Adult PT Treatment/Exercise - 12/12/16 0801      Lumbar Exercises: Stretches   Passive Hamstring Stretch 30 seconds;2 reps   Passive Hamstring Stretch Limitations seated on edge of mat     Lumbar Exercises: Machines for Strengthening   Other Lumbar Machine Exercise BATCA Low Row 35# x15     Lumbar Exercises: Standing   Other Standing Lumbar Exercises B pallof cable press 5# x15, B pallof press with green TB x15     Knee/Hip Exercises: Aerobic   Nustep lvl 5 x 8'     Knee/Hip Exercises: Standing   Hip Flexion Both;10 reps;Knee straight   Hip Flexion Limitations green TB, 1-2 pole A   Hip ADduction Both;10 reps   Hip ADduction Limitations green TB, 1-2 pole A   Hip Abduction Both;10 reps   Abduction Limitations green TB, 1-2 pole A   Hip Extension Both;10 reps   Extension Limitations green TB, 1-2 pole A     Knee/Hip Exercises: Supine  Bridges Strengthening;Both;15 reps   Bridges Limitations 5 second hold - with glute squeeze   Bridges with Clamshell 15 reps  + alt hip ABD/ER with blue TB   Straight Leg Raises Strengthening;Both;15 reps   Straight Leg Raises Limitations 3#     Knee/Hip Exercises: Sidelying   Clams B LE 2 x 15 - blue tband at knees                PT Education - 12/12/16 0844    Education provided Yes   Education Details HEP update   Person(s) Educated Patient   Methods Explanation;Demonstration;Handout   Comprehension Verbalized understanding;Returned demonstration          PT Short Term Goals - 12/12/16 0804      PT SHORT TERM GOAL #1   Title Independent with inital HEP by 12/14/16   Status Achieved           PT Long Term Goals - 12/12/16 0805      PT LONG TERM GOAL #1   Title Independent  with advanced HEP/gym program by 01/18/17   Status On-going     PT LONG TERM GOAL #2   Title Pt will report ability to sit >/= 45 minutes w/o onset or worsening of R LE pain or numbness by 01/18/17   Status On-going     PT LONG TERM GOAL #3   Title Pt will report 50% or greater improvement in R LE pain/numbess by 01/18/17   Status On-going               Plan - 12/12/16 0805    Clinical Impression Statement Pt reporting benefit from PT with significant reduction in low back pain since starting PT to the point where he has not needed to take pain meds for the past 3 days. Reports good compliance with HEP, typically completing exercises 2x/day, and denies any issues or concerns with HEP. States he has added his own stretch while driving (seated HS stretch), therefore clarified proper technique. Pt continues to tolerate progression of lumbar stabilization/strengthening well and will benefit from continued PT for further flexibility and strength training to prevent return of LBP.   Rehab Potential Good   Clinical Impairments Affecting Rehab Potential morbid obesity, HTN   PT Treatment/Interventions Patient/family education;Therapeutic exercise;Neuromuscular re-education;Manual techniques;Taping;Dry needling;Therapeutic activities;Functional mobility training;Electrical Stimulation;Moist Heat;Cryotherapy;Traction;Ultrasound;ADLs/Self Care Home Management   PT Next Visit Plan Lumbar/proximal LE flexibilty & strenthening/stabilization; HEP updates as indicated; Manual therapy & modalities PRN   Consulted and Agree with Plan of Care Patient      Patient will benefit from skilled therapeutic intervention in order to improve the following deficits and impairments:  Pain, Impaired flexibility, Increased muscle spasms, Postural dysfunction, Increased fascial restricitons, Decreased strength, Decreased activity tolerance  Visit Diagnosis: Radiculopathy, lumbosacral region  Pain in right  leg  Muscle weakness (generalized)     Problem List Patient Active Problem List   Diagnosis Date Noted  . Diabetes type 2, controlled ([place]) 02/23/2016  . HTN (hypertension) 02/23/2016  . Hyperlipidemia 02/23/2016  . Gout 02/23/2016  . GERD (gastroesophageal reflux disease) 02/23/2016  . CAD (coronary artery disease) 02/23/2016    Percival Spanish, PT, MPT 12/12/2016, 8:52 AM  [facility] [address]  [place], [place], [address] Phone: [phone]   Fax:  [phone]  Name: NIKOLAY DUDIK MRN: [id number] Date of Birth: 04/29/1946

## 2016-12-14 ENCOUNTER — Ambulatory Visit: Payer: Medicare Other | Admitting: Physical Therapy

## 2016-12-14 ENCOUNTER — Telehealth: Payer: Self-pay | Admitting: Family

## 2016-12-14 DIAGNOSIS — M5417 Radiculopathy, lumbosacral region: Secondary | ICD-10-CM | POA: Diagnosis not present

## 2016-12-14 DIAGNOSIS — M6281 Muscle weakness (generalized): Secondary | ICD-10-CM

## 2016-12-14 DIAGNOSIS — M79604 Pain in right leg: Secondary | ICD-10-CM

## 2016-12-14 NOTE — Telephone Encounter (Signed)
Noted  

## 2016-12-14 NOTE — Telephone Encounter (Signed)
Caller name: Nysir Relation to pt: self Call back number: [phone] Pharmacy:  Reason for call: Pt came in office since pt was at his Physical therapy appt today 12-14-2016 across the hall, pt wanted to inform provider that his Therapy's is really working for him (his back pain). Pt states that it is making him feel a lot better and wanted the provider to be updated with it.

## 2016-12-14 NOTE — Therapy (Signed)
[facility] [address]  [place], [place], [address] Phone: [phone]   Fax:  [phone]  Physical Therapy Treatment  Patient Details  Name: Steven Payne MRN: [id number] Date of Birth: 1946-05-03 Referring Provider: [name], NP  Encounter Date: 12/14/2016      PT End of Session - 12/14/16 0759    Visit Number 4   Number of Visits 12   Date for PT Re-Evaluation 01/18/17   Authorization Type Medicare/Tricare - PT only   PT Start Time 0757   PT Stop Time 0836   PT Time Calculation (min) 39 min   Activity Tolerance Patient tolerated treatment well   Behavior During Therapy [facility] for tasks assessed/performed      Past Medical History:  Diagnosis Date  . Arthritis   . Coronary artery disease cardiologist-  dr Irish Lack   per cardiac cath 06/ 2013--  no sig. cad, lvef 60%, sluggish ectatic coronary artery flow of lad, lcx, rc  . ED (erectile dysfunction)   . GERD (gastroesophageal reflux disease)   . History of gout    approx 2002  . Hyperlipemia   . Hypertension   . Hypogonadism male   . Left hydrocele   . Type 2 diabetes mellitus ([place])     Past Surgical History:  Procedure Laterality Date  . APPENDECTOMY  age 28's  . CARDIAC CATHETERIZATION  05-08-2012  dr Elonda Husky at [facility]   no significant cad, sluggish ectatic coronary artery flow (rca, lad, lcx), normal LVSF, ef 60%  . HYDROCELE EXCISION Left 09/10/2016   Procedure: HYDROCELECTOMY ADULT;  Surgeon: Kathie Rhodes, MD;  Location: [facility];  Service: Urology;  Laterality: Left;  . UMBILICAL HERNIA REPAIR  1980's    There were no vitals filed for this visit.      Subjective Assessment - 12/14/16 0758    Subjective Patient feeling well today - hasn't had pain in about a week   Diagnostic tests MRI Lumbar spine 11/08/16 - Mild degenerative changes for age, mainly facet spurring. No impingement to explain leg symptoms.   Patient  Stated Goals "To get rid of or lessen the pain"   Currently in Pain? No/denies   Pain Score 0-No pain                         [facility] Adult PT Treatment/Exercise - 12/14/16 0001      Lumbar Exercises: Machines for Strengthening   Other Lumbar Machine Exercise BATCA Low Row 45# x15 reps with 3 second hold     Lumbar Exercises: Standing   Forward Lunge Limitations side stepping 30 feet each way with green tband at ankles   Other Standing Lumbar Exercises B pallof press with green tband - 15 reps each side   Other Standing Lumbar Exercises forward monster walks 20 feet x 2 - green tband at ankles     Knee/Hip Exercises: Aerobic   Nustep level 6 x 8 minutes     Knee/Hip Exercises: Standing   Hip Flexion Both;15 reps;Knee straight   Hip Flexion Limitations green TB, 1-2 pole A   Hip ADduction Both;15 reps   Hip ADduction Limitations green TB, 1-2 pole A   Hip Abduction Both;15 reps;Knee straight   Abduction Limitations green TB, 1-2 pole A   Hip Extension Both;15 reps;Knee straight   Extension Limitations green TB, 1-2 pole A   Functional Squat 15 reps   Functional Squat  Limitations B UE support at counter     Knee/Hip Exercises: Supine   Bridges Strengthening;Both;15 reps   Bridges Limitations 5 second hold - with glute squeeze - blue tband at knees   Bridges with Clamshell Strengthening;Both;15 reps  blue tband at knees - alt hip ER/abd   Other Supine Knee/Hip Exercises bridge with BLE extended on peanut ball x 15 reps with 3 second hold     Knee/Hip Exercises: Sidelying   Clams B LE 2 x 15 - blue tband at knees - VC to not roll back                  PT Short Term Goals - 12/12/16 0804      PT SHORT TERM GOAL #1   Title Independent with inital HEP by 12/14/16   Status Achieved           PT Long Term Goals - 12/12/16 0805      PT LONG TERM GOAL #1   Title Independent with advanced HEP/gym program by 01/18/17   Status On-going     PT LONG  TERM GOAL #2   Title Pt will report ability to sit >/= 45 minutes w/o onset or worsening of R LE pain or numbness by 01/18/17   Status On-going     PT LONG TERM GOAL #3   Title Pt will report 50% or greater improvement in R LE pain/numbess by 01/18/17   Status On-going               Plan - 12/14/16 0801    Clinical Impression Statement Patient with continued excellent compliance with HEP for stretching and strengthening program. Patient reports some difficulty with resisted SLR at home as patient does not feel stable with available hand hold at home. Patient reports he has been pain free for approx 1 week and genuinely feels a benefit from PT. Patient to continue to benefit from PT to maximize function and to prevent/manage future LBP.    PT Treatment/Interventions Patient/family education;Therapeutic exercise;Neuromuscular re-education;Manual techniques;Taping;Dry needling;Therapeutic activities;Functional mobility training;Electrical Stimulation;Moist Heat;Cryotherapy;Traction;Ultrasound;ADLs/Self Care Home Management   PT Next Visit Plan Lumbar/proximal LE flexibilty & strenthening/stabilization; HEP updates as indicated; Manual therapy & modalities PRN   Consulted and Agree with Plan of Care Patient      Patient will benefit from skilled therapeutic intervention in order to improve the following deficits and impairments:  Pain, Impaired flexibility, Increased muscle spasms, Postural dysfunction, Increased fascial restricitons, Decreased strength, Decreased activity tolerance  Visit Diagnosis: Radiculopathy, lumbosacral region  Pain in right leg  Muscle weakness (generalized)     Problem List Patient Active Problem List   Diagnosis Date Noted  . Diabetes type 2, controlled ([place]) 02/23/2016  . HTN (hypertension) 02/23/2016  . Hyperlipidemia 02/23/2016  . Gout 02/23/2016  . GERD (gastroesophageal reflux disease) 02/23/2016  . CAD (coronary artery disease) 02/23/2016     Lanney Gins, PT, DPT 12/14/16 8:37 AM   [facility] [address]  [place], [place], [address] Phone: [phone]   Fax:  [phone]  Name: Steven Payne MRN: [id number] Date of Birth: 1946/06/18

## 2016-12-19 ENCOUNTER — Ambulatory Visit: Payer: Medicare Other | Admitting: Physical Therapy

## 2016-12-21 ENCOUNTER — Ambulatory Visit: Payer: Medicare Other | Admitting: Physical Therapy

## 2016-12-22 IMAGING — US US EXTREM LOW VENOUS*R*
1 series · 14 of 24 positions shown · non-contrast
Comparison: None.

CLINICAL DATA: Right upper leg numbness for 1 week

EXAM:
RIGHT LOWER EXTREMITY VENOUS DUPLEX ULTRASOUND
TECHNIQUE: Doppler venous assessment of the left lower extremity deep venous
system was performed, including characterization of spectral flow,
compressibility, and phasicity.

[Series 1: us extrem low venous*right* · 0.09mm/px · 14 of 28 slices shown]
[im 1/28]
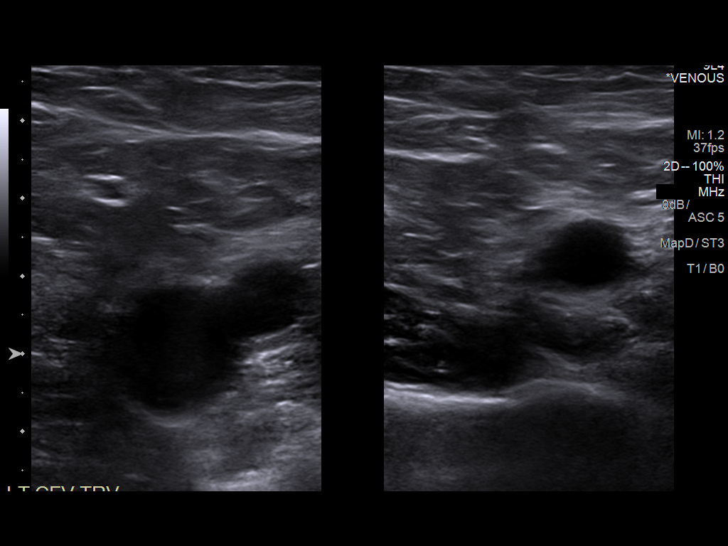
[im 3/28]
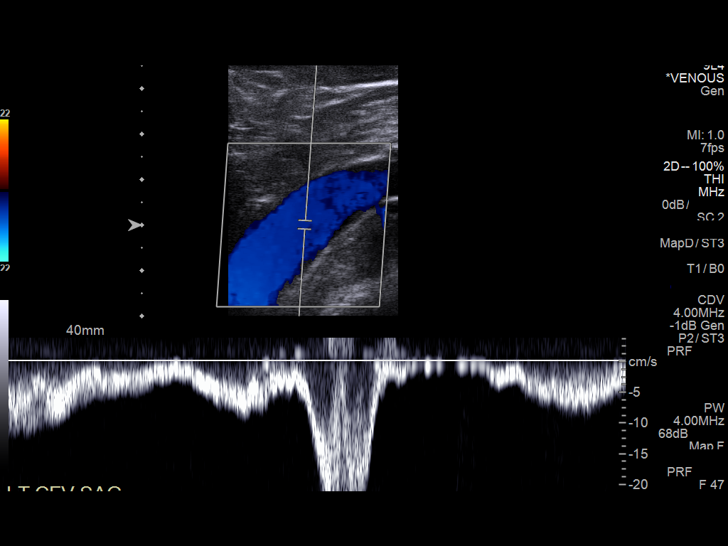
[im 5/28]
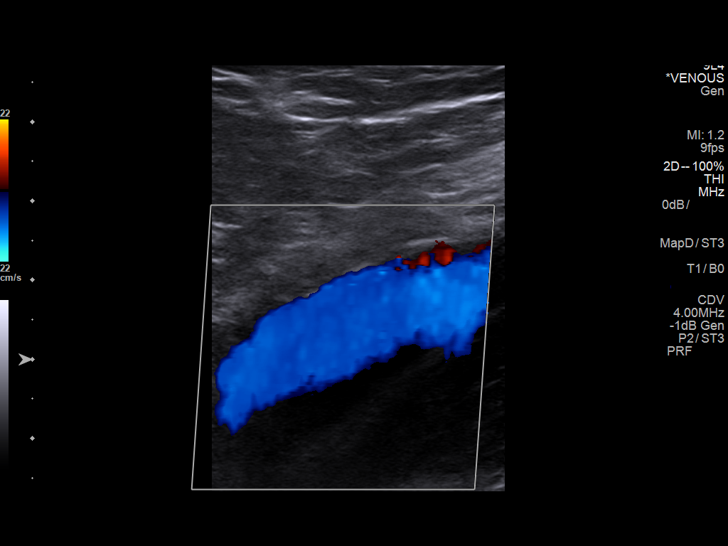
[im 8/28]
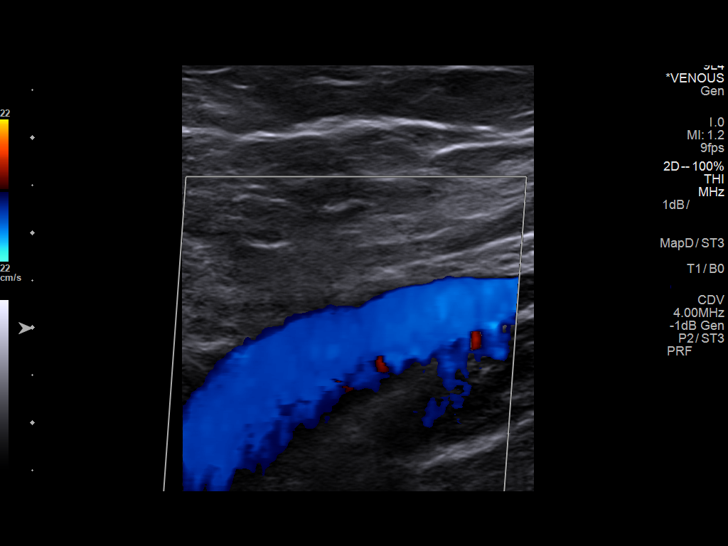
[im 9/28]
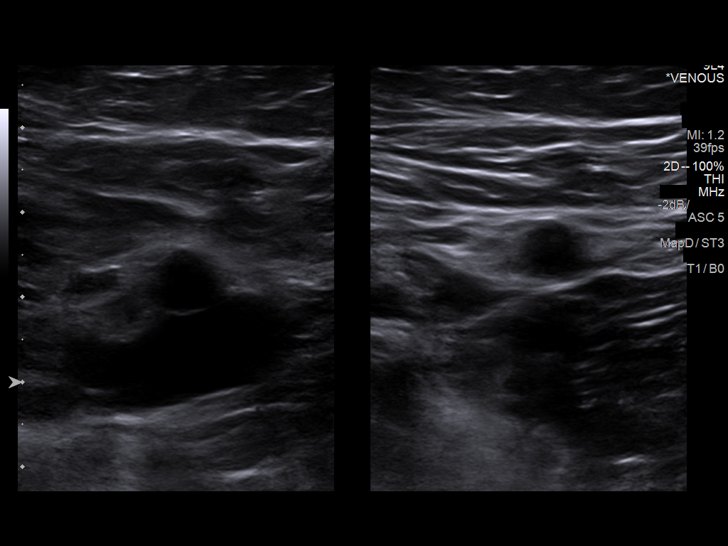
[im 11/28]
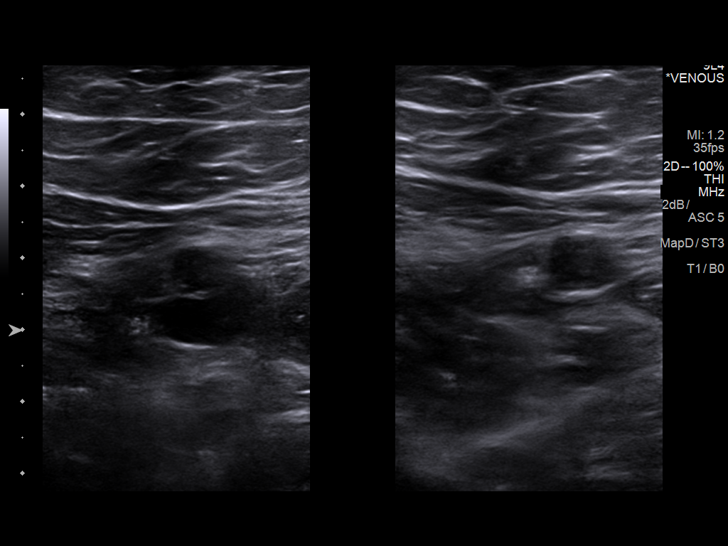
[im 13/28]
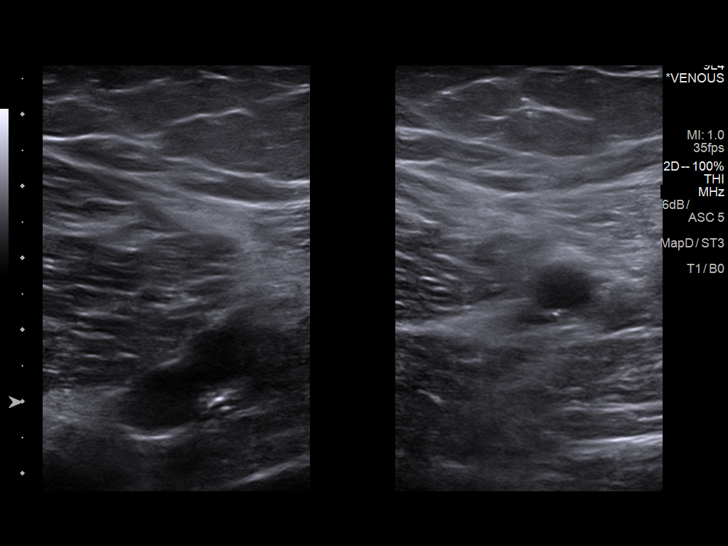
[im 15/28]
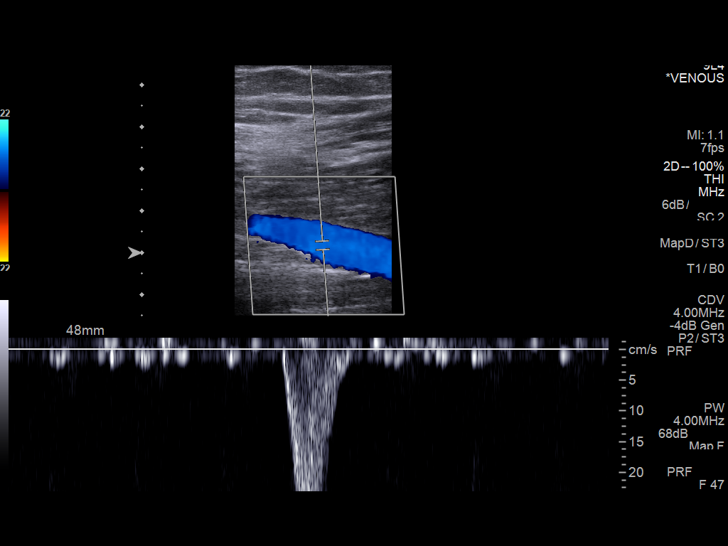
[im 17/28]
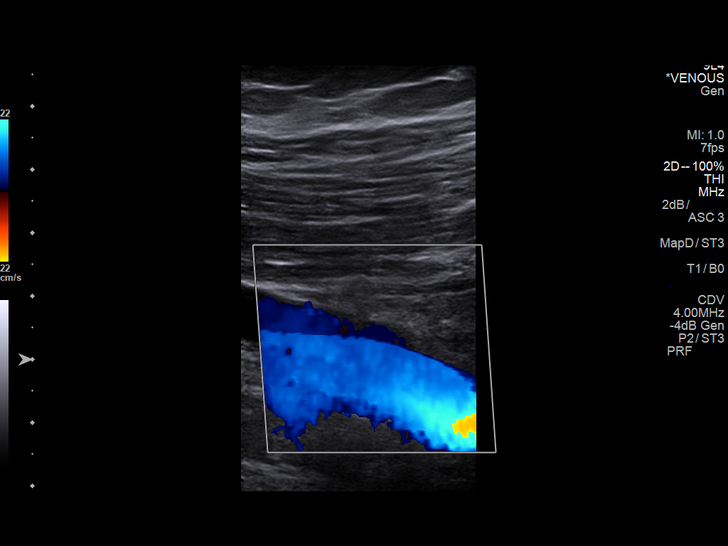
[im 19/28]
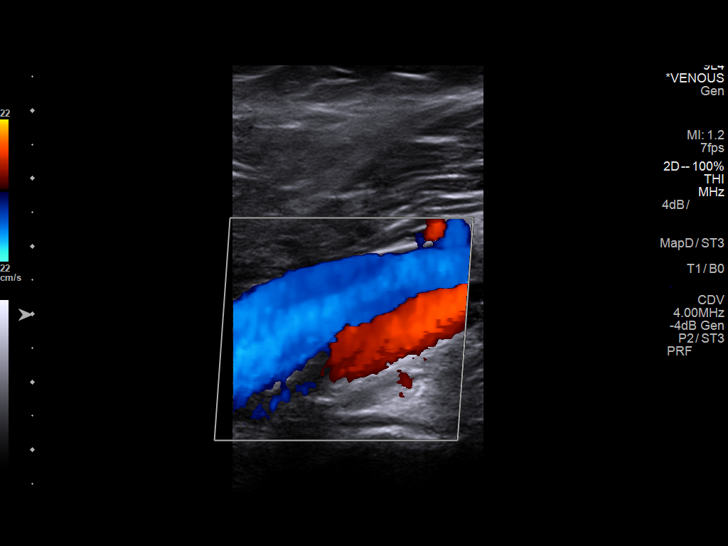
[im 22/28]
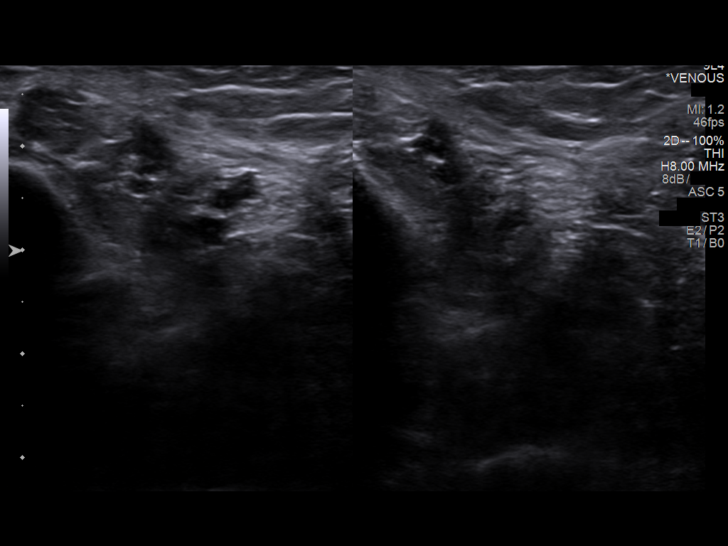
[im 23/28]
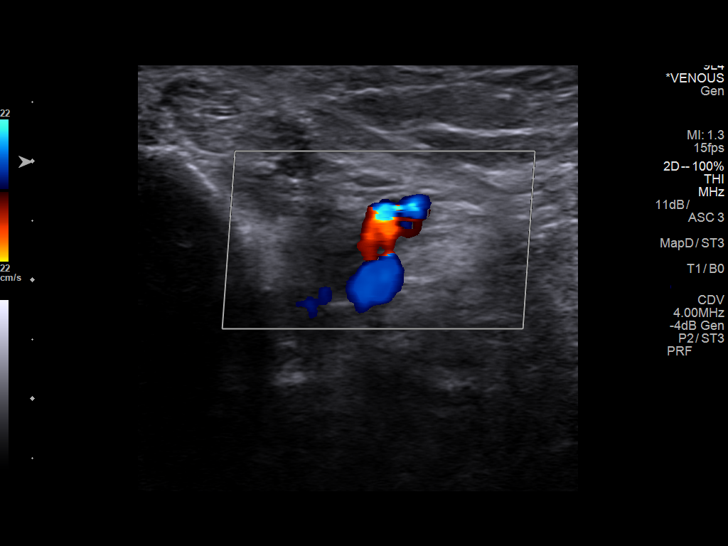
[im 25/28]
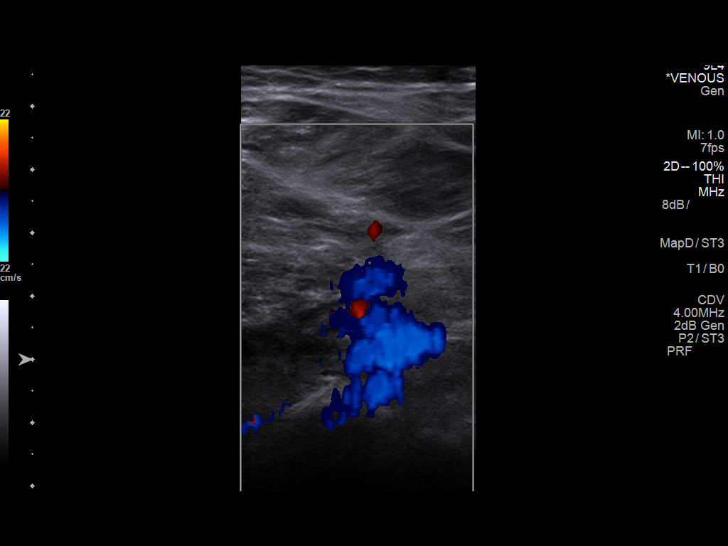
[im 28/28]
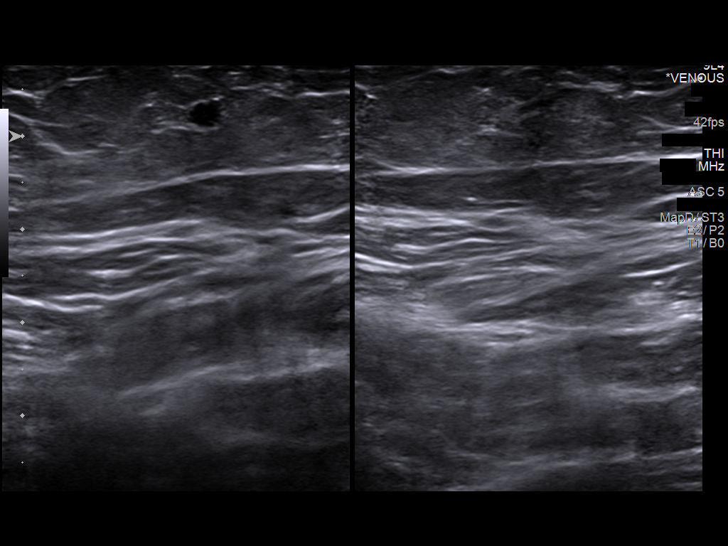

[14 of 24 positions shown; findings below may reference images not displayed]

FINDINGS: There is complete compressibility of the right common femoral,
femoral, and popliteal veins. Doppler analysis demonstrates
respiratory phasicity and augmentation of flow with calf
compression. No obvious superficial vein or calf vein thrombosis.
IMPRESSION: No evidence of right lower extremity DVT.

## 2016-12-26 ENCOUNTER — Ambulatory Visit: Payer: Medicare Other | Admitting: Physical Therapy

## 2016-12-26 DIAGNOSIS — M5417 Radiculopathy, lumbosacral region: Secondary | ICD-10-CM | POA: Diagnosis not present

## 2016-12-26 DIAGNOSIS — M79604 Pain in right leg: Secondary | ICD-10-CM | POA: Diagnosis not present

## 2016-12-26 DIAGNOSIS — M6281 Muscle weakness (generalized): Secondary | ICD-10-CM

## 2016-12-26 NOTE — Therapy (Signed)
[facility] [address]  [place], [place], [address] Phone: [phone]   Fax:  [phone]  Physical Therapy Treatment  Patient Details  Name: Steven Payne MRN: [id number] Date of Birth: 06-Sep-1946 Referring Provider: [name], NP  Encounter Date: 12/26/2016      PT End of Session - 12/26/16 0807    Visit Number 5   Number of Visits 12   Date for PT Re-Evaluation 01/18/17   Authorization Type Medicare/Tricare - PT only   PT Start Time 0804   PT Stop Time 0844   PT Time Calculation (min) 40 min   Activity Tolerance Patient tolerated treatment well   Behavior During Therapy [facility] for tasks assessed/performed      Past Medical History:  Diagnosis Date  . Arthritis   . Coronary artery disease cardiologist-  dr Irish Lack   per cardiac cath 06/ 2013--  no sig. cad, lvef 60%, sluggish ectatic coronary artery flow of lad, lcx, rc  . ED (erectile dysfunction)   . GERD (gastroesophageal reflux disease)   . History of gout    approx 2002  . Hyperlipemia   . Hypertension   . Hypogonadism male   . Left hydrocele   . Type 2 diabetes mellitus ([place])     Past Surgical History:  Procedure Laterality Date  . APPENDECTOMY  age 58's  . CARDIAC CATHETERIZATION  05-08-2012  dr Elonda Husky at [facility]   no significant cad, sluggish ectatic coronary artery flow (rca, lad, lcx), normal LVSF, ef 60%  . HYDROCELE EXCISION Left 09/10/2016   Procedure: HYDROCELECTOMY ADULT;  Surgeon: Kathie Rhodes, MD;  Location: [facility];  Service: Urology;  Laterality: Left;  . UMBILICAL HERNIA REPAIR  1980's    There were no vitals filed for this visit.      Subjective Assessment - 12/26/16 0807    Subjective Pt reports back has been feeling good.    Patient Stated Goals "To get rid of or lessen the pain"   Currently in Pain? No/denies   Pain Score 0-No pain                         [facility] Adult PT  Treatment/Exercise - 12/26/16 0804      Knee/Hip Exercises: Aerobic   Nustep lvl 6 x 8'     Knee/Hip Exercises: Standing   Hip Flexion Both;15 reps;Knee straight   Hip Flexion Limitations green TB; 1-2 pole A; stand on foam   Forward Lunges 15 reps;3 seconds   Forward Lunges Limitations UE support at counter   Hip ADduction Both;15 reps   Hip ADduction Limitations green TB, 1-2 pole A; stand on foam   Hip Abduction Both;15 reps;Knee straight   Abduction Limitations green TB; 1-2 pole A; stand on foam   Hip Extension Both;15 reps;Knee straight   Extension Limitations green TB, 1-2 pole A; stand on foam   Functional Squat 15 reps   Functional Squat Limitations B UE support at counter   Other Standing Knee Exercises Side Stepping with Green TB; 30 feet each way     Knee/Hip Exercises: Supine   Other Supine Knee/Hip Exercises bridge with B LE extended on peanut ball x 15 reps with 3 second hold   Other Supine Knee/Hip Exercises bridge with peanut ball; 15 reps; kick each leg off ball (SL bridge)     Knee/Hip Exercises: Sidelying   Clams B LE 2 x 15 -  blue tband at knees - VC to not roll back                  PT Short Term Goals - 12/12/16 0804      PT SHORT TERM GOAL #1   Title Independent with inital HEP by 12/14/16   Status Achieved           PT Long Term Goals - 12/12/16 0805      PT LONG TERM GOAL #1   Title Independent with advanced HEP/gym program by 01/18/17   Status On-going     PT LONG TERM GOAL #2   Title Pt will report ability to sit >/= 45 minutes w/o onset or worsening of R LE pain or numbness by 01/18/17   Status On-going     PT LONG TERM GOAL #3   Title Pt will report 50% or greater improvement in R LE pain/numbess by 01/18/17   Status On-going               Plan - 12/26/16 0808    Clinical Impression Statement Pt reports HEP has continued to go well and believes stretches are really helpful. He states that his back has been feeling  great and reports no pain. He tolerated progressions in core/LE strengthening well and will continue to benefit from core/LE strengthening & stabilization. Pt needed verbal cues about form with squats and lunges to keep knees behind toes. Pt had no change in pain upon completion of today's session.    Rehab Potential Good   Clinical Impairments Affecting Rehab Potential morbid obesity, HTN   PT Treatment/Interventions Patient/family education;Therapeutic exercise;Neuromuscular re-education;Manual techniques;Taping;Dry needling;Therapeutic activities;Functional mobility training;Electrical Stimulation;Moist Heat;Cryotherapy;Traction;Ultrasound;ADLs/Self Care Home Management   PT Next Visit Plan Continue with progression of Core/LE strengthening/stabilization & LE flexibility; update HEP; Manual therapy & modalities PRN   Consulted and Agree with Plan of Care Patient      Patient will benefit from skilled therapeutic intervention in order to improve the following deficits and impairments:  Pain, Impaired flexibility, Increased muscle spasms, Postural dysfunction, Increased fascial restricitons, Decreased strength, Decreased activity tolerance  Visit Diagnosis: Radiculopathy, lumbosacral region  Pain in right leg  Muscle weakness (generalized)     Problem List Patient Active Problem List   Diagnosis Date Noted  . Diabetes type 2, controlled ([place]) 02/23/2016  . HTN (hypertension) 02/23/2016  . Hyperlipidemia 02/23/2016  . Gout 02/23/2016  . GERD (gastroesophageal reflux disease) 02/23/2016  . CAD (coronary artery disease) 02/23/2016    Lauralee Evener, SPT 12/26/2016, 9:06 AM  [facility] [address]  [place], [place], [address] Phone: [phone]   Fax:  [phone]  Name: Steven Payne MRN: [id number] Date of Birth: [birth year]

## 2016-12-28 ENCOUNTER — Ambulatory Visit: Payer: Medicare Other | Admitting: Physical Therapy

## 2016-12-28 DIAGNOSIS — M6281 Muscle weakness (generalized): Secondary | ICD-10-CM

## 2016-12-28 DIAGNOSIS — M79604 Pain in right leg: Secondary | ICD-10-CM | POA: Diagnosis not present

## 2016-12-28 DIAGNOSIS — M5417 Radiculopathy, lumbosacral region: Secondary | ICD-10-CM | POA: Diagnosis not present

## 2016-12-28 NOTE — Therapy (Signed)
[facility] [address]  [place], [place], [address] Phone: [phone]   Fax:  [phone]  Physical Therapy Treatment  Patient Details  Name: ALEZANDER LYCETT MRN: [id number] Date of Birth: 05-Feb-1946 Referring Provider: [name], NP  Encounter Date: 12/28/2016      PT End of Session - 12/28/16 0801    Visit Number 6   Number of Visits 12   Date for PT Re-Evaluation 01/18/17   Authorization Type Medicare/Tricare - PT only   PT Start Time 0800   PT Stop Time 0832   PT Time Calculation (min) 32 min   Activity Tolerance Patient tolerated treatment well   Behavior During Therapy [facility] for tasks assessed/performed      Past Medical History:  Diagnosis Date  . Arthritis   . Coronary artery disease cardiologist-  dr Irish Lack   per cardiac cath 06/ 2013--  no sig. cad, lvef 60%, sluggish ectatic coronary artery flow of lad, lcx, rc  . ED (erectile dysfunction)   . GERD (gastroesophageal reflux disease)   . History of gout    approx 2002  . Hyperlipemia   . Hypertension   . Hypogonadism male   . Left hydrocele   . Type 2 diabetes mellitus ([place])     Past Surgical History:  Procedure Laterality Date  . APPENDECTOMY  age 71's  . CARDIAC CATHETERIZATION  05-08-2012  dr Elonda Husky at [facility]   no significant cad, sluggish ectatic coronary artery flow (rca, lad, lcx), normal LVSF, ef 60%  . HYDROCELE EXCISION Left 09/10/2016   Procedure: HYDROCELECTOMY ADULT;  Surgeon: Kathie Rhodes, MD;  Location: [facility];  Service: Urology;  Laterality: Left;  . UMBILICAL HERNIA REPAIR  1980's    There were no vitals filed for this visit.      Subjective Assessment - 12/28/16 0801    Subjective Pt reports back has been feeling good and that he has no pain.    Patient Stated Goals "To get rid of or lessen the pain"   Currently in Pain? No/denies   Pain Score 0-No pain                          [facility] Adult PT Treatment/Exercise - 12/28/16 0759      Lumbar Exercises: Standing   Forward Lunge Limitations side stepping 30 feet each way with green tband at ankles     Lumbar Exercises: Quadruped   Straight Leg Raise 10 reps;5 seconds   Straight Leg Raises Limitations with ab contraction     Knee/Hip Exercises: Aerobic   Nustep lvl 6 x 8'     Knee/Hip Exercises: Standing   Functional Squat 15 reps;3 seconds   Functional Squat Limitations B UE support at counter     Knee/Hip Exercises: Supine   Bridges with Clamshell Strengthening;Both;15 reps  blue tband at knees - alt hip ER/abd     Knee/Hip Exercises: Sidelying   Clams B LE 15 reps- blue TB at knees + Ab contraction                PT Education - 12/28/16 0840    Education provided Yes   Education Details Updated HEP   Person(s) Educated Patient   Methods Explanation;Demonstration;Handout   Comprehension Verbalized understanding;Returned demonstration          PT Short Term Goals - 12/12/16 0804      PT SHORT TERM GOAL #  1   Title Independent with inital HEP by 12/14/16   Status Achieved           PT Long Term Goals - 12/28/16 0808      PT LONG TERM GOAL #1   Title Independent with advanced HEP/gym program by 01/18/17   Status On-going     PT LONG TERM GOAL #2   Title Pt will report ability to sit >/= 45 minutes w/o onset or worsening of R LE pain or numbness by 01/18/17   Status Achieved     PT LONG TERM GOAL #3   Title Pt will report 50% or greater improvement in R LE pain/numbess by 01/18/17   Status Achieved               Plan - 12/28/16 0848    Clinical Impression Statement Pt reports he is continuing to have no low back pain and feels great. He reports that he feels he has had 100% improvement in back pain and R LE symptoms since beginning therapy. He states that it takes 2-3 hours in the car before he feels like he needs to stop and walk  around/stretch for a few minutes. Possible discharge was discussed with patient today and he feels ready to try continuing with his HEP on his own. He will return next week to discuss any concerns or questions with HEP and whether he would like to be placed on a 30 day hold or be discharged. Pt was able to perform core/LE exercises today with proper form and technique. Pt had no reports of pain during or following today's session. Today's session was shortened due to pt request to leave early in order to make it to next appointment.    Rehab Potential Good   Clinical Impairments Affecting Rehab Potential morbid obesity, HTN   PT Treatment/Interventions Patient/family education;Therapeutic exercise;Neuromuscular re-education;Manual techniques;Taping;Dry needling;Therapeutic activities;Functional mobility training;Electrical Stimulation;Moist Heat;Cryotherapy;Traction;Ultrasound;ADLs/Self Care Home Management   PT Next Visit Plan Assess updated HEP; Assess remaining unmet goals & strength; discuss final D/C plans   Consulted and Agree with Plan of Care Patient      Patient will benefit from skilled therapeutic intervention in order to improve the following deficits and impairments:  Pain, Impaired flexibility, Increased muscle spasms, Postural dysfunction, Increased fascial restricitons, Decreased strength, Decreased activity tolerance  Visit Diagnosis: Radiculopathy, lumbosacral region  Pain in right leg  Muscle weakness (generalized)     Problem List Patient Active Problem List   Diagnosis Date Noted  . Diabetes type 2, controlled ([place]) 02/23/2016  . HTN (hypertension) 02/23/2016  . Hyperlipidemia 02/23/2016  . Gout 02/23/2016  . GERD (gastroesophageal reflux disease) 02/23/2016  . CAD (coronary artery disease) 02/23/2016    Lauralee Evener, SPT 12/28/2016, 8:59 AM  [facility] [address]  [place], [place],  [address] Phone: [phone]   Fax:  [phone]  Name: ROHUN HENSCH MRN: [id number] Date of Birth: 13-Oct-1946

## 2017-01-02 ENCOUNTER — Ambulatory Visit: Payer: Medicare Other | Admitting: Physical Therapy

## 2017-01-09 ENCOUNTER — Ambulatory Visit: Payer: Medicare Other | Admitting: Physical Therapy

## 2017-01-14 DIAGNOSIS — Z62891 Sibling rivalry: Secondary | ICD-10-CM | POA: Diagnosis not present

## 2017-01-14 DIAGNOSIS — F4310 Post-traumatic stress disorder, unspecified: Secondary | ICD-10-CM | POA: Diagnosis not present

## 2017-01-14 DIAGNOSIS — Z9149 Other personal history of psychological trauma, not elsewhere classified: Secondary | ICD-10-CM | POA: Diagnosis not present

## 2017-01-16 ENCOUNTER — Ambulatory Visit: Payer: Medicare Other | Attending: Family | Admitting: Physical Therapy

## 2017-01-16 DIAGNOSIS — M79604 Pain in right leg: Secondary | ICD-10-CM | POA: Insufficient documentation

## 2017-01-16 DIAGNOSIS — M5417 Radiculopathy, lumbosacral region: Secondary | ICD-10-CM | POA: Diagnosis not present

## 2017-01-16 DIAGNOSIS — M6281 Muscle weakness (generalized): Secondary | ICD-10-CM

## 2017-01-16 NOTE — Therapy (Signed)
[facility] [address]  [place], [place], [address] Phone: [phone]   Fax:  [phone]  Physical Therapy Treatment  Patient Details  Name: Steven Payne MRN: [id number] Date of Birth: 26-Apr-1946 Referring Provider: [name], NP  Encounter Date: 01/16/2017      PT End of Session - 01/16/17 0758    Visit Number 7   Number of Visits 12   Date for PT Re-Evaluation 01/18/17   Authorization Type Medicare/Tricare - PT only   PT Start Time 0756   PT Stop Time 0843   PT Time Calculation (min) 47 min   Activity Tolerance Patient tolerated treatment well   Behavior During Therapy [facility] for tasks assessed/performed      Past Medical History:  Diagnosis Date  . Arthritis   . Coronary artery disease cardiologist-  dr Irish Lack   per cardiac cath 06/ 2013--  no sig. cad, lvef 60%, sluggish ectatic coronary artery flow of lad, lcx, rc  . ED (erectile dysfunction)   . GERD (gastroesophageal reflux disease)   . History of gout    approx 2002  . Hyperlipemia   . Hypertension   . Hypogonadism male   . Left hydrocele   . Type 2 diabetes mellitus ([place])     Past Surgical History:  Procedure Laterality Date  . APPENDECTOMY  age 1's  . CARDIAC CATHETERIZATION  05-08-2012  dr Elonda Husky at [facility]   no significant cad, sluggish ectatic coronary artery flow (rca, lad, lcx), normal LVSF, ef 60%  . HYDROCELE EXCISION Left 09/10/2016   Procedure: HYDROCELECTOMY ADULT;  Surgeon: Kathie Rhodes, MD;  Location: [facility];  Service: Urology;  Laterality: Left;  . UMBILICAL HERNIA REPAIR  1980's    There were no vitals filed for this visit.      Subjective Assessment - 01/16/17 0757    Subjective Had the flu - feeling well otherwise. Has had no back or leg pain.    How long can you sit comfortably? unlimited   Diagnostic tests MRI Lumbar spine 11/08/16 - Mild degenerative changes for age, mainly facet  spurring. No impingement to explain leg symptoms.   Patient Stated Goals "To get rid of or lessen the pain"   Currently in Pain? No/denies   Pain Score 0-No pain            [facility] PT Assessment - 01/16/17 0803      Assessment   Medical Diagnosis Lumbar DDD   Referring Provider [name], NP   Onset Date/Surgical Date 09/10/16   Next MD Visit 02/04/17     Observation/Other Assessments   Focus on Therapeutic Outcomes (FOTO)  Lumbar Spine - 94 (6% limitation)     Strength   Strength Assessment Site Hip   Right/Left Hip Right;Left   Right Hip Flexion 4+/5   Right Hip Extension 4-/5   Right Hip ABduction 4+/5   Right Hip ADduction 4+/5   Left Hip Flexion 4+/5   Left Hip Extension 4/5   Left Hip ABduction 5/5   Left Hip ADduction 4+/5                     [facility] Adult PT Treatment/Exercise - 01/16/17 0800      Lumbar Exercises: Standing   Forward Lunge Limitations side stepping 30 feet each way with blue tband at ankles   Other Standing Lumbar Exercises B pallof press with blue tband - 15 reps each side  Lumbar Exercises: Quadruped   Straight Leg Raise 10 reps;5 seconds   Straight Leg Raises Limitations with ab contraction - single leg kickouts     Knee/Hip Exercises: Aerobic   Nustep lvl 6 x 8'     Knee/Hip Exercises: Standing   Hip Flexion Both;10 reps;Knee straight   Hip Flexion Limitations blue tband - 1-2 pole A   Hip ADduction Both;10 reps   Hip ADduction Limitations blue tband; 1-2 pole A   Hip Abduction Both;10 reps;Knee straight   Abduction Limitations blue tband; 1-2 pole A   Hip Extension Both;10 reps;Knee straight   Extension Limitations blue tband; 1-2 pole A   Functional Squat 15 reps;3 seconds   Functional Squat Limitations B UE support at counter - VC for form     Knee/Hip Exercises: Supine   Bridges Strengthening;Both;15 reps   Bridges Limitations 5 second hold - with glute squeeze - blue tband at [employer] with  Clamshell Strengthening;Both;15 reps  blue tband at knees     Knee/Hip Exercises: Sidelying   Clams B LE x 20 reps - blue tband at knees - core engagement prior to movement                  PT Short Term Goals - 2017/02/10 0758      PT SHORT TERM GOAL #1   Title Independent with inital HEP by 73/12/18   Status Achieved           PT Long Term Goals - 02-10-17 0758      PT LONG TERM GOAL #1   Title Independent with advanced HEP/gym program by 01/18/17   Status Achieved     PT LONG TERM GOAL #2   Title Pt will report ability to sit >/= 45 minutes w/o onset or worsening of R LE pain or numbness by 01/18/17   Status Achieved     PT LONG TERM GOAL #3   Title Pt will report 50% or greater improvement in R LE pain/numbess by 01/18/17   Status Achieved               Plan - 02-10-2017 0759    Clinical Impression Statement [name] doing very well - reports no pain with daily activities, including driving. Patient reporting he can now sit comfortably for unlimited time periods and verbalizes pain prevention strategies when driving for prolonged periods. Patient meeting all established goals and is able to demonstrate good HEP compliance. Patient to be discharge from skilled PT today as he is fully capable of managing/preventing pain and perfomring HEP independently at home. Patient welcome to return to PT in the future for any PT related needs.    Rehab Potential Good   Clinical Impairments Affecting Rehab Potential morbid obesity, HTN   PT Frequency 2x / week   PT Duration 6 weeks   PT Treatment/Interventions Patient/family education;Therapeutic exercise;Neuromuscular re-education;Manual techniques;Taping;Dry needling;Therapeutic activities;Functional mobility training;Electrical Stimulation;Moist Heat;Cryotherapy;Traction;Ultrasound;ADLs/Self Care Home Management   PT Next Visit Plan Discharge on this date   Consulted and Agree with Plan of Care Patient      Patient  will benefit from skilled therapeutic intervention in order to improve the following deficits and impairments:  Pain, Impaired flexibility, Increased muscle spasms, Postural dysfunction, Increased fascial restricitons, Decreased strength, Decreased activity tolerance  Visit Diagnosis: Radiculopathy, lumbosacral region  Pain in right leg  Muscle weakness (generalized)       G-Codes - 02-10-17 0847    Functional Assessment Tool Used Lumbar  spine - 94 (6% limitation)   Functional Limitation Changing and maintaining body position   Changing and Maintaining Body Position Goal Status (Y0737) At least 20 percent but less than 40 percent impaired, limited or restricted   Changing and Maintaining Body Position Discharge Status (T0626) At least 1 percent but less than 20 percent impaired, limited or restricted      Problem List Patient Active Problem List   Diagnosis Date Noted  . Diabetes type 2, controlled ([place]) 02/23/2016  . HTN (hypertension) 02/23/2016  . Hyperlipidemia 02/23/2016  . Gout 02/23/2016  . GERD (gastroesophageal reflux disease) 02/23/2016  . CAD (coronary artery disease) 02/23/2016    Lanney Gins, PT, DPT 01/16/17 8:57 AM   PHYSICAL THERAPY DISCHARGE SUMMARY  Visits from Start of Care: 7  Current functional level related to goals / functional outcomes: See above   Remaining deficits: See above; non - met all goals, no pain   Education / Equipment: HEP  Plan: Patient agrees to discharge.  Patient goals were met. Patient is being discharged due to meeting the stated rehab goals.  ?????     Lanney Gins, PT, DPT 01/16/17 8:57 AM   [facility] [address]  [place], [place], [address] Phone: [phone]   Fax:  [phone]  Name: ESTON HESLIN MRN: [id number] Date of Birth: [birth year]

## 2017-02-04 ENCOUNTER — Ambulatory Visit: Payer: Medicare Other | Admitting: Family

## 2017-02-04 DIAGNOSIS — Z62891 Sibling rivalry: Secondary | ICD-10-CM | POA: Diagnosis not present

## 2017-02-04 DIAGNOSIS — F4310 Post-traumatic stress disorder, unspecified: Secondary | ICD-10-CM | POA: Diagnosis not present

## 2017-02-04 DIAGNOSIS — Z9149 Other personal history of psychological trauma, not elsewhere classified: Secondary | ICD-10-CM | POA: Diagnosis not present

## 2017-02-10 ENCOUNTER — Other Ambulatory Visit: Payer: Self-pay | Admitting: Family

## 2017-02-11 NOTE — Telephone Encounter (Signed)
Pt due for follow up please call and schedule appointment.  

## 2017-02-11 NOTE — Telephone Encounter (Signed)
lvm for pt to call to schedule F/U.

## 2017-02-18 DIAGNOSIS — Z9149 Other personal history of psychological trauma, not elsewhere classified: Secondary | ICD-10-CM | POA: Diagnosis not present

## 2017-02-18 DIAGNOSIS — F4310 Post-traumatic stress disorder, unspecified: Secondary | ICD-10-CM | POA: Diagnosis not present

## 2017-02-18 DIAGNOSIS — Z62891 Sibling rivalry: Secondary | ICD-10-CM | POA: Diagnosis not present

## 2017-03-09 ENCOUNTER — Other Ambulatory Visit: Payer: Self-pay | Admitting: Family

## 2017-03-11 ENCOUNTER — Ambulatory Visit (INDEPENDENT_AMBULATORY_CARE_PROVIDER_SITE_OTHER): Payer: Medicare Other | Admitting: Family

## 2017-03-11 ENCOUNTER — Encounter: Payer: Self-pay | Admitting: Family

## 2017-03-11 VITALS — BP 136/69 | HR 66 | Temp 98.2°F | Resp 16 | Ht 67.0 in | Wt 279.4 lb

## 2017-03-11 DIAGNOSIS — Z1159 Encounter for screening for other viral diseases: Secondary | ICD-10-CM | POA: Diagnosis not present

## 2017-03-11 DIAGNOSIS — Z62891 Sibling rivalry: Secondary | ICD-10-CM | POA: Diagnosis not present

## 2017-03-11 DIAGNOSIS — I2510 Atherosclerotic heart disease of native coronary artery without angina pectoris: Secondary | ICD-10-CM | POA: Diagnosis not present

## 2017-03-11 DIAGNOSIS — E119 Type 2 diabetes mellitus without complications: Secondary | ICD-10-CM

## 2017-03-11 DIAGNOSIS — I10 Essential (primary) hypertension: Secondary | ICD-10-CM

## 2017-03-11 DIAGNOSIS — F4310 Post-traumatic stress disorder, unspecified: Secondary | ICD-10-CM | POA: Diagnosis not present

## 2017-03-11 DIAGNOSIS — K219 Gastro-esophageal reflux disease without esophagitis: Secondary | ICD-10-CM | POA: Diagnosis not present

## 2017-03-11 DIAGNOSIS — N529 Male erectile dysfunction, unspecified: Secondary | ICD-10-CM

## 2017-03-11 DIAGNOSIS — Z9149 Other personal history of psychological trauma, not elsewhere classified: Secondary | ICD-10-CM | POA: Diagnosis not present

## 2017-03-11 DIAGNOSIS — Z1211 Encounter for screening for malignant neoplasm of colon: Secondary | ICD-10-CM

## 2017-03-11 HISTORY — DX: Male erectile dysfunction, unspecified: N52.9

## 2017-03-11 LAB — BASIC METABOLIC PANEL
BUN: 13 mg/dL (ref 6–23)
CO2: 24 mEq/L (ref 19–32)
Calcium: 9.9 mg/dL (ref 8.4–10.5)
Chloride: 100 mEq/L (ref 96–112)
Creatinine, Ser: 0.85 mg/dL (ref 0.40–1.50)
GFR: 113.82 mL/min (ref >60.00)
Glucose, Bld: 117 mg/dL — ABNORMAL HIGH (ref 70–99)
Potassium: 3.2 mEq/L — ABNORMAL LOW (ref 3.5–5.1)
Sodium: 139 mEq/L (ref 135–145)

## 2017-03-11 LAB — MICROALBUMIN / CREATININE URINE RATIO
Creatinine, Urine: 215 mg/dL (ref 20–370)
Microalb Creat Ratio: 5 mcg/mg creat (ref <30)
Microalb, Ur: 1.1 mg/dL

## 2017-03-11 LAB — HEPATITIS C ANTIBODY: HCV Ab: NEGATIVE

## 2017-03-11 LAB — HEMOGLOBIN A1C: Hgb A1c MFr Bld: 6.7 % — ABNORMAL HIGH (ref 4.6–6.5)

## 2017-03-11 MED ORDER — TADALAFIL 10 MG PO TABS: 10.0000 mg | ORAL_TABLET | ORAL | 5 refills | Status: DC | PRN

## 2017-03-11 NOTE — Assessment & Plan Note (Signed)
Clinically stable, continue med management and annual follow up with cardiology.

## 2017-03-11 NOTE — Assessment & Plan Note (Signed)
Stable on cialis, cont same

## 2017-03-11 NOTE — Progress Notes (Signed)
Pre visit review using our clinic review tool, if applicable. No additional management support is needed unless otherwise documented below in the visit note. 

## 2017-03-11 NOTE — Assessment & Plan Note (Signed)
Stable. We did discuss that he can trial off of PPI and see how he does. If symptoms return, resume protonix.

## 2017-03-11 NOTE — Assessment & Plan Note (Signed)
Stable on current meds.  Continue same. 

## 2017-03-11 NOTE — Progress Notes (Signed)
Subjective:    Patient ID: Steven Payne, male    DOB: [birth year], 71 y.o.   MRN: [id number]  HPI  Steven Payne is a 71 yr old male who presents today for follow up.  1) Hyperlipidemia- maintained on lipitor 40mg . Denies myalgia.   Lab Results  Component Value Date   CHOL 158 11/06/2016   HDL 36.10 (L) 11/06/2016   LDLCALC 101 (H) 11/06/2016   TRIG 107.0 11/06/2016   CHOLHDL 4 11/06/2016   2) HTN- on diovan hct, diltiazem.  BP Readings from Last 3 Encounters:  03/11/17 136/69  11/06/16 132/60  09/28/16 148/77   3) DM2-on metformin 1000mg  bid. Checks his sugar twice a week.  Reports that his sugar is generally around 106-107.   Lab Results  Component Value Date   HGBA1C 6.6 (H) 11/06/2016   HGBA1C 6.3 06/25/2016   HGBA1C 6.5 02/22/2016   Lab Results  Component Value Date   MICROALBUR 1.4 02/22/2016   LDLCALC 101 (H) 11/06/2016   CREATININE 0.89 11/06/2016   4) CAD- on med management (plavix), statin- followed annually by cardiology (Dr. Irish Lack).  5) GERD- maintained on protonix. Reports that gerd symptoms are well controlled.   6)  ED- maintained on cialis. Reports that cialis works well for his ED.   Review of Systems  Respiratory: Negative for shortness of breath.   Cardiovascular: Negative for chest pain and leg swelling.   Past Medical History:  Diagnosis Date  . Arthritis   . Coronary artery disease cardiologist-  dr Irish Lack   per cardiac cath 06/ 2013--  no sig. cad, lvef 60%, sluggish ectatic coronary artery flow of lad, lcx, rc  . ED (erectile dysfunction)   . GERD (gastroesophageal reflux disease)   . History of gout    approx 2002  . Hyperlipemia   . Hypertension   . Hypogonadism male   . Left hydrocele   . Type 2 diabetes mellitus ([place])      Social History   Social History  . Marital status: Married    Spouse name: N/A  . Number of children: N/A  . Years of education: N/A   Occupational History  . Not on file.   Social History  Main Topics  . Smoking status: Former Smoker    Years: 1.00    Quit date: 03/27/1991  . Smokeless tobacco: Never Used  . Alcohol use No  . Drug use: No  . Sexual activity: Not on file   Other Topics Concern  . 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  No grandchildren.  Oldest in [place], youngest in [place]   Completed bachelors, some graduate (Estate manager/land agent)   Enjoys travelling, walking, cruises    Past Surgical History:  Procedure Laterality Date  . APPENDECTOMY  age 37's  . CARDIAC CATHETERIZATION  05-08-2012  dr Elonda Husky at [facility]   no significant cad, sluggish ectatic coronary artery flow (rca, lad, lcx), normal LVSF, ef 60%  . HYDROCELE EXCISION Left 09/10/2016   Procedure: HYDROCELECTOMY ADULT;  Surgeon: Kathie Rhodes, MD;  Location: [facility];  Service: Urology;  Laterality: Left;  . UMBILICAL HERNIA REPAIR  1980's    Family History  Problem Relation Age of Onset  . Coronary artery disease Father   . Diabetes Father   . Prostate cancer Father  died at 16  . Alcohol abuse Father   . Coronary artery disease Mother     died age 72  . Alcohol abuse Mother   . Cancer Mother     unsure, + thyroid cancer  . Breast cancer Paternal Grandmother     Allergies  Allergen Reactions  . Tanzeum [Albiglutide] Other (See Comments)    constipation    Current Outpatient Prescriptions on File Prior to Visit  Medication Sig Dispense Refill  . atorvastatin (LIPITOR) 40 MG tablet TAKE 1 TABLET DAILY 30 tablet 0  . clopidogrel (PLAVIX) 75 MG tablet Take 1 tablet (75 mg total) by mouth daily. 90 tablet 1  . diltiazem (TIAZAC) 300 MG 24 hr capsule Take 1 capsule (300 mg total) by mouth every morning. 90 capsule 1  . metFORMIN (GLUCOPHAGE) 1000 MG tablet Take 1 tablet (1,000 mg total) by mouth daily with breakfast. 90 tablet  1  . pantoprazole (PROTONIX) 40 MG tablet Take 1 tablet (40 mg total) by mouth daily. 90 tablet 1  . tadalafil (CIALIS) 10 MG tablet Take 10 mg by mouth as needed for erectile dysfunction.    . traMADol (ULTRAM) 50 MG tablet Take 1 tablet (50 mg total) by mouth every 8 (eight) hours as needed. 30 tablet 0  . valsartan-hydrochlorothiazide (DIOVAN-HCT) 160-25 MG tablet Take 1 tablet by mouth every evening. 90 tablet 1   No current facility-administered medications on file prior to visit.     BP 136/69 (BP Location: Right Arm, Cuff Size: Large)   Pulse 66   Temp 98.2 F (36.8 C) (Oral)   Resp 16   Ht 5\' 7"  (1.702 m)   Wt 279 lb 6.4 oz (126.7 kg)   SpO2 99% Comment: room air  BMI 43.76 kg/m        Objective:   Physical Exam  Constitutional: He is oriented to person, place, and time. He appears well-developed and well-nourished. No distress.  HENT:  Head: Normocephalic and atraumatic.  Cardiovascular: Normal rate and regular rhythm.   No murmur heard. Pulmonary/Chest: Effort normal and breath sounds normal. No respiratory distress. He has no wheezes. He has no rales.  Musculoskeletal: He exhibits no edema.  Neurological: He is alert and oriented to person, place, and time.  Skin: Skin is warm and dry.  Psychiatric: He has a normal mood and affect. His behavior is normal. Thought content normal.          Assessment & Plan:

## 2017-03-11 NOTE — Assessment & Plan Note (Signed)
At goal on metformin, continue same. Discussed importance of diet/exercise/weight loss.

## 2017-03-11 NOTE — Patient Instructions (Signed)
Please complete lab work prior to leaving.   

## 2017-03-13 ENCOUNTER — Telehealth: Payer: Self-pay | Admitting: *Deleted

## 2017-03-13 NOTE — Telephone Encounter (Signed)
Received fax from [place] that Cialis will require prior auth. Spoke with pt. He has already tried Viagra and it caused him to have headaches. There is a plan limit for all ED drugs (#18 / 90 days) with 4 refills. Form completed and forwarded to PCP for signature.

## 2017-03-14 ENCOUNTER — Other Ambulatory Visit: Payer: Self-pay | Admitting: Family

## 2017-03-14 MED ORDER — POTASSIUM CHLORIDE CRYS ER 20 MEQ PO TBCR: 20.0000 meq | EXTENDED_RELEASE_TABLET | Freq: Every day | ORAL | 3 refills | Status: DC

## 2017-03-14 NOTE — Telephone Encounter (Signed)
Potassium is low. Add kdur once daily, repeat bmet in 1 week. Dx hypokalemia. Sugar is at goal. Kidney function is normal.

## 2017-03-14 NOTE — Telephone Encounter (Signed)
Patient informed, understood & agreed; Rx to pharmacy, repeat lab scheduled 03/15/17 at 7:30pm/SLS 04/12

## 2017-03-15 ENCOUNTER — Other Ambulatory Visit: Payer: Medicare Other

## 2017-03-18 DIAGNOSIS — F4310 Post-traumatic stress disorder, unspecified: Secondary | ICD-10-CM | POA: Diagnosis not present

## 2017-03-18 DIAGNOSIS — Z9149 Other personal history of psychological trauma, not elsewhere classified: Secondary | ICD-10-CM | POA: Diagnosis not present

## 2017-03-18 DIAGNOSIS — Z62891 Sibling rivalry: Secondary | ICD-10-CM | POA: Diagnosis not present

## 2017-03-18 NOTE — Telephone Encounter (Signed)
Form signed and faxed to [phone]. Awaiting response.

## 2017-03-22 NOTE — Telephone Encounter (Signed)
Received approval from [employer] good through 12/02/2098. Notified pt.

## 2017-03-25 ENCOUNTER — Encounter: Payer: Self-pay | Admitting: Family

## 2017-03-25 ENCOUNTER — Other Ambulatory Visit (INDEPENDENT_AMBULATORY_CARE_PROVIDER_SITE_OTHER): Payer: Medicare Other

## 2017-03-25 DIAGNOSIS — Z62891 Sibling rivalry: Secondary | ICD-10-CM | POA: Diagnosis not present

## 2017-03-25 DIAGNOSIS — Z9149 Other personal history of psychological trauma, not elsewhere classified: Secondary | ICD-10-CM | POA: Diagnosis not present

## 2017-03-25 DIAGNOSIS — F4310 Post-traumatic stress disorder, unspecified: Secondary | ICD-10-CM | POA: Diagnosis not present

## 2017-03-25 DIAGNOSIS — E876 Hypokalemia: Secondary | ICD-10-CM | POA: Diagnosis not present

## 2017-03-25 LAB — BASIC METABOLIC PANEL
BUN: 9 mg/dL (ref 6–23)
CO2: 25 mEq/L (ref 19–32)
Calcium: 9.2 mg/dL (ref 8.4–10.5)
Chloride: 104 mEq/L (ref 96–112)
Creatinine, Ser: 0.81 mg/dL (ref 0.40–1.50)
GFR: 120.32 mL/min (ref >60.00)
Glucose, Bld: 116 mg/dL — ABNORMAL HIGH (ref 70–99)
Potassium: 3.8 mEq/L (ref 3.5–5.1)
Sodium: 139 mEq/L (ref 135–145)

## 2017-03-27 ENCOUNTER — Other Ambulatory Visit: Payer: Self-pay | Admitting: Family

## 2017-04-15 DIAGNOSIS — Z9149 Other personal history of psychological trauma, not elsewhere classified: Secondary | ICD-10-CM | POA: Diagnosis not present

## 2017-04-15 DIAGNOSIS — Z62891 Sibling rivalry: Secondary | ICD-10-CM | POA: Diagnosis not present

## 2017-04-15 DIAGNOSIS — F4310 Post-traumatic stress disorder, unspecified: Secondary | ICD-10-CM | POA: Diagnosis not present

## 2017-04-22 ENCOUNTER — Encounter: Payer: Self-pay | Admitting: Family

## 2017-04-22 DIAGNOSIS — Z62891 Sibling rivalry: Secondary | ICD-10-CM | POA: Diagnosis not present

## 2017-04-22 DIAGNOSIS — Z9149 Other personal history of psychological trauma, not elsewhere classified: Secondary | ICD-10-CM | POA: Diagnosis not present

## 2017-04-22 DIAGNOSIS — F4310 Post-traumatic stress disorder, unspecified: Secondary | ICD-10-CM | POA: Diagnosis not present

## 2017-04-26 ENCOUNTER — Other Ambulatory Visit: Payer: Self-pay | Admitting: Family

## 2017-05-06 DIAGNOSIS — Z9149 Other personal history of psychological trauma, not elsewhere classified: Secondary | ICD-10-CM | POA: Diagnosis not present

## 2017-05-06 DIAGNOSIS — F4310 Post-traumatic stress disorder, unspecified: Secondary | ICD-10-CM | POA: Diagnosis not present

## 2017-05-06 DIAGNOSIS — Z62891 Sibling rivalry: Secondary | ICD-10-CM | POA: Diagnosis not present

## 2017-05-21 ENCOUNTER — Other Ambulatory Visit: Payer: Self-pay | Admitting: Family

## 2017-05-27 DIAGNOSIS — Z62891 Sibling rivalry: Secondary | ICD-10-CM | POA: Diagnosis not present

## 2017-05-27 DIAGNOSIS — Z9149 Other personal history of psychological trauma, not elsewhere classified: Secondary | ICD-10-CM | POA: Diagnosis not present

## 2017-05-27 DIAGNOSIS — F4310 Post-traumatic stress disorder, unspecified: Secondary | ICD-10-CM | POA: Diagnosis not present

## 2017-06-10 DIAGNOSIS — F4310 Post-traumatic stress disorder, unspecified: Secondary | ICD-10-CM | POA: Diagnosis not present

## 2017-06-10 DIAGNOSIS — Z9149 Other personal history of psychological trauma, not elsewhere classified: Secondary | ICD-10-CM | POA: Diagnosis not present

## 2017-06-10 DIAGNOSIS — Z62891 Sibling rivalry: Secondary | ICD-10-CM | POA: Diagnosis not present

## 2017-06-17 DIAGNOSIS — F4310 Post-traumatic stress disorder, unspecified: Secondary | ICD-10-CM | POA: Diagnosis not present

## 2017-06-17 DIAGNOSIS — Z9149 Other personal history of psychological trauma, not elsewhere classified: Secondary | ICD-10-CM | POA: Diagnosis not present

## 2017-06-17 DIAGNOSIS — Z62891 Sibling rivalry: Secondary | ICD-10-CM | POA: Diagnosis not present

## 2017-06-24 ENCOUNTER — Other Ambulatory Visit: Payer: Self-pay | Admitting: Family

## 2017-07-01 ENCOUNTER — Encounter: Payer: Self-pay | Admitting: Family

## 2017-07-01 DIAGNOSIS — F4310 Post-traumatic stress disorder, unspecified: Secondary | ICD-10-CM | POA: Diagnosis not present

## 2017-07-01 DIAGNOSIS — Z9149 Other personal history of psychological trauma, not elsewhere classified: Secondary | ICD-10-CM | POA: Diagnosis not present

## 2017-07-01 DIAGNOSIS — Z62891 Sibling rivalry: Secondary | ICD-10-CM | POA: Diagnosis not present

## 2017-07-01 NOTE — Telephone Encounter (Signed)
Please have pt call his cardiologist since Lenna Sciara is not here-- thankyou

## 2017-07-01 NOTE — Telephone Encounter (Signed)
Patient is calling regarding this.

## 2017-07-02 ENCOUNTER — Telehealth: Payer: Self-pay | Admitting: Family

## 2017-07-02 NOTE — Telephone Encounter (Signed)
Please advise    PC 

## 2017-07-02 NOTE — Telephone Encounter (Signed)
Relation to pt: self  Call back [phone]   D.O.D Dr. Charlett Blake   Reason for call:  Patient has a scheduled colonoscopy appointment for Friday 07/05/17 and was advised by Gastro to d/c clopidogrel (PLAVIX) 75 MG tablet for 5 days, please advise patient thru MyChart if this is ok.

## 2017-07-02 NOTE — Telephone Encounter (Signed)
He is not high risk he can hold the Plavix for 5 days as requested by gastroenterology. Please let him know

## 2017-07-02 NOTE — Telephone Encounter (Signed)
Patient informed of PCP instructions. 

## 2017-07-05 DIAGNOSIS — Z86010 Personal history of colonic polyps: Secondary | ICD-10-CM | POA: Diagnosis not present

## 2017-07-05 DIAGNOSIS — Z860101 Personal history of adenomatous and serrated colon polyps: Secondary | ICD-10-CM

## 2017-07-05 DIAGNOSIS — K5730 Diverticulosis of large intestine without perforation or abscess without bleeding: Secondary | ICD-10-CM | POA: Diagnosis not present

## 2017-07-05 HISTORY — DX: Personal history of adenomatous and serrated colon polyps: Z86.0101

## 2017-07-05 LAB — HM COLONOSCOPY

## 2017-07-15 ENCOUNTER — Encounter: Payer: Self-pay | Admitting: Family

## 2017-07-15 ENCOUNTER — Ambulatory Visit (INDEPENDENT_AMBULATORY_CARE_PROVIDER_SITE_OTHER): Payer: Medicare Other | Admitting: Family

## 2017-07-15 VITALS — BP 124/72 | HR 67 | Temp 98.3°F | Resp 16 | Ht 67.0 in | Wt 278.4 lb

## 2017-07-15 DIAGNOSIS — R739 Hyperglycemia, unspecified: Secondary | ICD-10-CM

## 2017-07-15 DIAGNOSIS — I10 Essential (primary) hypertension: Secondary | ICD-10-CM

## 2017-07-15 DIAGNOSIS — E785 Hyperlipidemia, unspecified: Secondary | ICD-10-CM | POA: Diagnosis not present

## 2017-07-15 DIAGNOSIS — F4310 Post-traumatic stress disorder, unspecified: Secondary | ICD-10-CM | POA: Diagnosis not present

## 2017-07-15 DIAGNOSIS — K219 Gastro-esophageal reflux disease without esophagitis: Secondary | ICD-10-CM

## 2017-07-15 DIAGNOSIS — I2510 Atherosclerotic heart disease of native coronary artery without angina pectoris: Secondary | ICD-10-CM | POA: Diagnosis not present

## 2017-07-15 DIAGNOSIS — E119 Type 2 diabetes mellitus without complications: Secondary | ICD-10-CM | POA: Diagnosis not present

## 2017-07-15 DIAGNOSIS — Z9149 Other personal history of psychological trauma, not elsewhere classified: Secondary | ICD-10-CM | POA: Diagnosis not present

## 2017-07-15 DIAGNOSIS — Z62891 Sibling rivalry: Secondary | ICD-10-CM | POA: Diagnosis not present

## 2017-07-15 LAB — COMPREHENSIVE METABOLIC PANEL
ALT: 19 U/L (ref 0–53)
AST: 20 U/L (ref 0–37)
Albumin: 4.3 g/dL (ref 3.5–5.2)
Alkaline Phosphatase: 82 U/L (ref 39–117)
BUN: 10 mg/dL (ref 6–23)
CO2: 27 mEq/L (ref 19–32)
Calcium: 9.3 mg/dL (ref 8.4–10.5)
Chloride: 101 mEq/L (ref 96–112)
Creatinine, Ser: 0.76 mg/dL (ref 0.40–1.50)
GFR: 129.39 mL/min (ref >60.00)
Glucose, Bld: 123 mg/dL — ABNORMAL HIGH (ref 70–99)
Potassium: 3.4 mEq/L — ABNORMAL LOW (ref 3.5–5.1)
Sodium: 137 mEq/L (ref 135–145)
Total Bilirubin: 0.4 mg/dL (ref 0.2–1.2)
Total Protein: 7 g/dL (ref 6.0–8.3)

## 2017-07-15 LAB — LIPID PANEL
Cholesterol: 137 mg/dL (ref 0–200)
HDL: 32.4 mg/dL — ABNORMAL LOW (ref >39.00)
LDL Cholesterol: 83 mg/dL (ref 0–99)
NonHDL: 104.9
Total CHOL/HDL Ratio: 4
Triglycerides: 110 mg/dL (ref 0.0–149.0)
VLDL: 22 mg/dL (ref 0.0–40.0)

## 2017-07-15 MED ORDER — ZOSTER VAC RECOMB ADJUVANTED 50 MCG/0.5ML IM SUSR: INTRAMUSCULAR | 1 refills | Status: DC

## 2017-07-15 NOTE — Progress Notes (Signed)
Subjective:    Patient ID: Steven Payne, male    DOB: Aug 21, 1946, 71 y.o.   MRN: [id number]  HPI  Steven Payne is a 71 yr old male who presents today for follow up.  1) Htn- maintained on diltiazem an diovan hct. Denies CP/SOB or swelling.  BP Readings from Last 3 Encounters:  07/15/17 124/72  03/11/17 136/69  11/06/16 132/60   2) DM2- maintained on metformin.  Exercises 3 times a week at [employer].   Lab Results  Component Value Date   HGBA1C 6.7 (H) 03/11/2017   HGBA1C 6.6 (H) 11/06/2016   HGBA1C 6.3 06/25/2016   Lab Results  Component Value Date   MICROALBUR 1.1 03/11/2017   LDLCALC 101 (H) 11/06/2016   CREATININE 0.81 03/25/2017   3) Hyperlipidemia- maintained on lipitor 40mg . Denies myalgia.   Lab Results  Component Value Date   CHOL 158 11/06/2016   HDL 36.10 (L) 11/06/2016   LDLCALC 101 (H) 11/06/2016   TRIG 107.0 11/06/2016   CHOLHDL 4 11/06/2016   4) GERD- thinks protonix affecting his memory. Stopped and is not working on dietary modification.  Reports occasional symptoms with certain foods.    Review of Systems See HPI  Past Medical History:  Diagnosis Date  . Arthritis   . Coronary artery disease cardiologist-  dr Irish Lack   per cardiac cath 06/ 2013--  no sig. cad, lvef 60%, sluggish ectatic coronary artery flow of lad, lcx, rc  . ED (erectile dysfunction)   . GERD (gastroesophageal reflux disease)   . History of gout    approx 2002  . Hyperlipemia   . Hypertension   . Hypogonadism male   . Left hydrocele   . Type 2 diabetes mellitus ([place])      Social History   Social History  . Marital status: Married    Spouse name: N/A  . Number of children: N/A  . Years of education: N/A   Occupational History  . Not on file.   Social History Main Topics  . Smoking status: Former Smoker    Years: 1.00    Quit date: 03/27/1991  . Smokeless tobacco: Never Used  . Alcohol use No  . Drug use: No  . Sexual activity: Not on file   Other  Topics Concern  . 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  No grandchildren.  Oldest in [place], youngest in [place]   Completed bachelors, some graduate (Estate manager/land agent)   Enjoys travelling, walking, cruises    Past Surgical History:  Procedure Laterality Date  . APPENDECTOMY  age 1's  . CARDIAC CATHETERIZATION  05-08-2012  dr Elonda Husky at [facility]   no significant cad, sluggish ectatic coronary artery flow (rca, lad, lcx), normal LVSF, ef 60%  . HYDROCELE EXCISION Left 09/10/2016   Procedure: HYDROCELECTOMY ADULT;  Surgeon: Kathie Rhodes, MD;  Location: [facility];  Service: Urology;  Laterality: Left;  . UMBILICAL HERNIA REPAIR  1980's    Family History  Problem Relation Age of Onset  . Coronary artery disease Father   . Diabetes Father   . Prostate cancer Father        died at 27  . Alcohol abuse Father   . Coronary artery disease Mother        died age 65  .  Alcohol abuse Mother   . Cancer Mother        unsure, + thyroid cancer  . Breast cancer Paternal Grandmother     Allergies  Allergen Reactions  . Tanzeum [Albiglutide] Other (See Comments)    constipation    Current Outpatient Prescriptions on File Prior to Visit  Medication Sig Dispense Refill  . atorvastatin (LIPITOR) 40 MG tablet TAKE 1 TABLET DAILY 30 tablet 0  . clopidogrel (PLAVIX) 75 MG tablet TAKE 1 TABLET DAILY 90 tablet 1  . diltiazem (TIAZAC) 300 MG 24 hr capsule TAKE 1 CAPSULE EVERY MORNING 90 capsule 1  . metFORMIN (GLUCOPHAGE) 1000 MG tablet TAKE 1 TABLET DAILY WITH BREAKFAST 90 tablet 1  . potassium chloride SA (K-DUR,KLOR-CON) 20 MEQ tablet Take 1 tablet (20 mEq total) by mouth daily. 30 tablet 3  . traMADol (ULTRAM) 50 MG tablet Take 1 tablet (50 mg total) by mouth every 8 (eight) hours as needed. 30 tablet 0  . valsartan-hydrochlorothiazide  (DIOVAN-HCT) 160-25 MG tablet TAKE 1 TABLET EVERY EVENING 90 tablet 1   No current facility-administered medications on file prior to visit.     BP 124/72 (BP Location: Right Arm, Cuff Size: Large)   Pulse 67   Temp 98.3 F (36.8 C) (Oral)   Resp 16   Ht 5\' 7"  (1.702 m)   Wt 278 lb 6.4 oz (126.3 kg)   SpO2 99%   BMI 43.60 kg/m       Objective:   Physical Exam  Constitutional: He is oriented to person, place, and time. He appears well-developed and well-nourished. No distress.  HENT:  Head: Normocephalic and atraumatic.  Cardiovascular: Normal rate and regular rhythm.   No murmur heard. Pulmonary/Chest: Effort normal and breath sounds normal. No respiratory distress. He has no wheezes. He has no rales.  Musculoskeletal: He exhibits no edema.  Neurological: He is alert and oriented to person, place, and time.  Skin: Skin is warm and dry.  Psychiatric: He has a normal mood and affect. His behavior is normal. Thought content normal.          Assessment & Plan:

## 2017-07-15 NOTE — Assessment & Plan Note (Signed)
Discussed DM diet, exercise and weight loss.  Continue metformin, obtain a1c.

## 2017-07-15 NOTE — Assessment & Plan Note (Signed)
Stable on current meds continue same 

## 2017-07-15 NOTE — Assessment & Plan Note (Signed)
Stable off of PPI.  

## 2017-07-15 NOTE — Patient Instructions (Addendum)
Continue to work on [employer], exercise and weight loss. Please bring Shingrix (shingles vaccine) to your pharmacy.

## 2017-07-15 NOTE — Assessment & Plan Note (Signed)
Tolerating statin, obtain follow-up lipid panel. 

## 2017-07-16 LAB — HEMOGLOBIN A1C
Hgb A1c MFr Bld: 6.4 % — ABNORMAL HIGH (ref <5.7)
Mean Plasma Glucose: 137 mg/dL

## 2017-07-17 ENCOUNTER — Telehealth: Payer: Self-pay | Admitting: Family

## 2017-07-17 DIAGNOSIS — E876 Hypokalemia: Secondary | ICD-10-CM

## 2017-07-17 MED ORDER — POTASSIUM CHLORIDE CRYS ER 20 MEQ PO TBCR: 20.0000 meq | EXTENDED_RELEASE_TABLET | Freq: Every day | ORAL | 3 refills | Status: DC

## 2017-07-17 NOTE — Telephone Encounter (Signed)
Notified pt. He states he had not taken potassium in 1 month and will need rx sent. Has been drinking tomato juice and eating 1/2 banana 2 - 3 days a week. Rx re-sent and he will call back to schedule lab appt. Future lab order entered.

## 2017-07-17 NOTE — Telephone Encounter (Signed)
Sugar and cholesterol has improved. His potassium is a little low. Please confirm that he is taking potassium every day and has not been missing doses. If he has been missing doses, please restart potassium supplement once daily. If he is taking every day that we should increase to twice daily. Either way please repeat be met in 1 week diagnosis hypokalemia.

## 2017-07-21 NOTE — Progress Notes (Signed)
Cardiology Office Note   Date:  07/22/2017   ID:  Steven, Payne 1946/04/22, MRN [id number]  PCP:  [name], NP    No chief complaint on file.  RF for CAD  Wt Readings from Last 3 Encounters:  07/22/17 281 lb (127.5 kg)  07/15/17 278 lb 6.4 oz (126.3 kg)  03/11/17 279 lb 6.4 oz (126.7 kg)       History of Present Illness: Steven Payne is a 71 y.o. male  Who has RF for CAD.  He was referred for cardiac evaluation.  He had a cath several years ago at [facility] due to abnormal ECG and high cholesterol.  No PTCA was performed.  He was started on Plavix.  No prior history of DVT.    He has relatives with PAD related to DM.   Father died of an MI at age 22.  Mother passed away with a h/o cancer.  Broither is alive and healthy.    Denies : Chest pain. Dizziness. Leg edema. Nitroglycerin use. Orthopnea. Palpitations. Paroxysmal nocturnal dyspnea. Shortness of breath. Syncope.   He exercises regularly.  He goes 3-4x/week for 30 minutes on the treadmill.  Feels well with exercise.  Unable to lose weight.  He is planning on joining weight watchers.     Past Medical History:  Diagnosis Date  . Arthritis   . Coronary artery disease cardiologist-  dr Irish Lack   per cardiac cath 06/ 2013--  no sig. cad, lvef 60%, sluggish ectatic coronary artery flow of lad, lcx, rc  . ED (erectile dysfunction)   . GERD (gastroesophageal reflux disease)   . History of gout    approx 2002  . Hyperlipemia   . Hypertension   . Hypogonadism male   . Left hydrocele   . Type 2 diabetes mellitus ([place])     Past Surgical History:  Procedure Laterality Date  . APPENDECTOMY  age 76's  . CARDIAC CATHETERIZATION  05-08-2012  dr Elonda Husky at [facility]   no significant cad, sluggish ectatic coronary artery flow (rca, lad, lcx), normal LVSF, ef 60%  . HYDROCELE EXCISION Left 09/10/2016   Procedure: HYDROCELECTOMY ADULT;  Surgeon: Kathie Rhodes, MD;  Location: [facility];   Service: Urology;  Laterality: Left;  . UMBILICAL HERNIA REPAIR  1980's     Current Outpatient Prescriptions  Medication Sig Dispense Refill  . atorvastatin (LIPITOR) 40 MG tablet TAKE 1 TABLET DAILY 30 tablet 0  . clopidogrel (PLAVIX) 75 MG tablet TAKE 1 TABLET DAILY 90 tablet 1  . diltiazem (CARDIZEM CD) 300 MG 24 hr capsule Take 300 mg by mouth daily.    Marland Kitchen diltiazem (TIAZAC) 300 MG 24 hr capsule TAKE 1 CAPSULE EVERY MORNING 90 capsule 1  . metFORMIN (GLUCOPHAGE) 1000 MG tablet TAKE 1 TABLET DAILY WITH BREAKFAST 90 tablet 1  . potassium chloride SA (K-DUR,KLOR-CON) 20 MEQ tablet Take 1 tablet (20 mEq total) by mouth daily. 30 tablet 3  . traMADol (ULTRAM) 50 MG tablet Take 1 tablet (50 mg total) by mouth every 8 (eight) hours as needed. 30 tablet 0  . valsartan-hydrochlorothiazide (DIOVAN-HCT) 160-25 MG tablet TAKE 1 TABLET EVERY EVENING 90 tablet 1  . Zoster Vac Recomb Adjuvanted [facility]) injection Inject 87mcg IM now and again in 2-6 months 0.5 mL 1   No current facility-administered medications for this visit.     Allergies:   Tanzeum [albiglutide]    Social History:  The patient  reports that he quit  smoking about 26 years ago. He quit after 1.00 year of use. He has never used smokeless tobacco. He reports that he does not drink alcohol or use drugs.   Family History:  The patient's family history includes Alcohol abuse in his father and mother; Breast cancer in his paternal grandmother; Cancer in his mother; Coronary artery disease in his father and mother; Diabetes in his father; Prostate cancer in his father.    ROS:  Please see the history of present illness.   Otherwise, review of systems are positive for difficulty losing weight.   All other systems are reviewed and negative.    PHYSICAL EXAM: VS:  BP (!) 130/98   Pulse 67   Ht 5\' 6"  (1.676 m)   Wt 281 lb (127.5 kg)   SpO2 98%   BMI 45.35 kg/m  , BMI Body mass index is 45.35 kg/m. GEN: Well nourished, well  developed, in no acute distress  HEENT: normal  Neck: no JVD, carotid bruits, or masses Cardiac: RRR; no murmurs, rubs, or gallops,no edema  Respiratory:  clear to auscultation bilaterally, normal work of breathing GI: soft, nontender, nondistended, + BS MS: no deformity or atrophy  Skin: warm and dry, no rash Neuro:  Strength and sensation are intact Psych: euthymic mood, full affect   EKG:   The ekg ordered today demonstrates prolonged PR interval, NSR, no ST segment changes   Recent Labs: 09/10/2016: Hemoglobin 13.9 07/15/2017: ALT 19; BUN 10; Creatinine, Ser 0.76; Potassium 3.4; Sodium 137   Lipid Panel    Component Value Date/Time   CHOL 137 07/15/2017 0731   TRIG 110.0 07/15/2017 0731   HDL 32.40 (L) 07/15/2017 0731   CHOLHDL 4 07/15/2017 0731   VLDL 22.0 07/15/2017 0731   LDLCALC 83 07/15/2017 0731     Other studies Reviewed: Additional studies/ records that were reviewed today with results demonstrating: lipids as noted above.   ASSESSMENT AND PLAN:  1. CAD: Prior negative cath in the past.  No angina.  COntinue aggressive secondary prevention. 2. Obesity: Saw a nutritionist to help with diet.  Still did not lose weight.  He will join [employer].  THey travel a lot and eat out a lot. 3. Family h/o CAD: Continue aggressive risk factor modification. 4. Continue BP management for HTN, DM management.  Follow-up by primary care physician.  Normal at home and at PMD. Typically 120-130/80. 5. Hyperlipidemia: Continue atorvastatin. LDL 83 earlier this month.   Current medicines are reviewed at length with the patient today.  The patient concerns regarding his medicines were addressed.  The following changes have been made:  No change  Labs/ tests ordered today include:  No orders of the defined types were placed in this encounter.   Recommend 150 minutes/week of aerobic exercise Low fat, low carb, high fiber diet recommended  Disposition:   FU in 1  year   Signed, Larae Grooms, MD  07/22/2017 8:33 AM    [facility] [address], [place], [place]  [address] Phone: [phone]; Fax: [phone]

## 2017-07-22 ENCOUNTER — Encounter: Payer: Self-pay | Admitting: Interventional Cardiology

## 2017-07-22 ENCOUNTER — Ambulatory Visit (INDEPENDENT_AMBULATORY_CARE_PROVIDER_SITE_OTHER): Payer: Medicare Other | Admitting: Interventional Cardiology

## 2017-07-22 VITALS — BP 130/98 | HR 67 | Ht 66.0 in | Wt 281.0 lb

## 2017-07-22 DIAGNOSIS — Z62891 Sibling rivalry: Secondary | ICD-10-CM | POA: Diagnosis not present

## 2017-07-22 DIAGNOSIS — I25119 Atherosclerotic heart disease of native coronary artery with unspecified angina pectoris: Secondary | ICD-10-CM

## 2017-07-22 DIAGNOSIS — Z9149 Other personal history of psychological trauma, not elsewhere classified: Secondary | ICD-10-CM | POA: Diagnosis not present

## 2017-07-22 DIAGNOSIS — E119 Type 2 diabetes mellitus without complications: Secondary | ICD-10-CM

## 2017-07-22 DIAGNOSIS — I209 Angina pectoris, unspecified: Secondary | ICD-10-CM | POA: Diagnosis not present

## 2017-07-22 DIAGNOSIS — E782 Mixed hyperlipidemia: Secondary | ICD-10-CM

## 2017-07-22 DIAGNOSIS — I10 Essential (primary) hypertension: Secondary | ICD-10-CM | POA: Diagnosis not present

## 2017-07-22 DIAGNOSIS — F4310 Post-traumatic stress disorder, unspecified: Secondary | ICD-10-CM | POA: Diagnosis not present

## 2017-07-22 NOTE — Patient Instructions (Signed)

## 2017-07-26 ENCOUNTER — Other Ambulatory Visit: Payer: Self-pay | Admitting: Family

## 2017-07-29 DIAGNOSIS — Z62891 Sibling rivalry: Secondary | ICD-10-CM | POA: Diagnosis not present

## 2017-07-29 DIAGNOSIS — F4310 Post-traumatic stress disorder, unspecified: Secondary | ICD-10-CM | POA: Diagnosis not present

## 2017-07-29 DIAGNOSIS — Z9149 Other personal history of psychological trauma, not elsewhere classified: Secondary | ICD-10-CM | POA: Diagnosis not present

## 2017-08-12 DIAGNOSIS — Z62891 Sibling rivalry: Secondary | ICD-10-CM | POA: Diagnosis not present

## 2017-08-12 DIAGNOSIS — Z9149 Other personal history of psychological trauma, not elsewhere classified: Secondary | ICD-10-CM | POA: Diagnosis not present

## 2017-08-12 DIAGNOSIS — F4310 Post-traumatic stress disorder, unspecified: Secondary | ICD-10-CM | POA: Diagnosis not present

## 2017-08-23 ENCOUNTER — Ambulatory Visit (INDEPENDENT_AMBULATORY_CARE_PROVIDER_SITE_OTHER): Payer: Medicare Other | Admitting: Behavioral Health

## 2017-08-23 DIAGNOSIS — Z23 Encounter for immunization: Secondary | ICD-10-CM

## 2017-08-23 NOTE — Progress Notes (Signed)
Pre visit review using our clinic review tool, if applicable. No additional management support is needed unless otherwise documented below in the visit note.  Patient came in clinic for influenza vaccination. IM injection was given in the left deltoid. Patient tolerated injection well.

## 2017-08-26 DIAGNOSIS — Z9149 Other personal history of psychological trauma, not elsewhere classified: Secondary | ICD-10-CM | POA: Diagnosis not present

## 2017-08-26 DIAGNOSIS — Z62891 Sibling rivalry: Secondary | ICD-10-CM | POA: Diagnosis not present

## 2017-08-26 DIAGNOSIS — F4310 Post-traumatic stress disorder, unspecified: Secondary | ICD-10-CM | POA: Diagnosis not present

## 2017-09-08 ENCOUNTER — Other Ambulatory Visit: Payer: Self-pay | Admitting: Family

## 2017-09-09 DIAGNOSIS — F4310 Post-traumatic stress disorder, unspecified: Secondary | ICD-10-CM | POA: Diagnosis not present

## 2017-09-09 DIAGNOSIS — Z62891 Sibling rivalry: Secondary | ICD-10-CM | POA: Diagnosis not present

## 2017-09-09 DIAGNOSIS — Z9149 Other personal history of psychological trauma, not elsewhere classified: Secondary | ICD-10-CM | POA: Diagnosis not present

## 2017-09-09 NOTE — Telephone Encounter (Signed)
Rx approved and sent to the pharmacy by e-script.//AB/CMA 

## 2017-09-23 ENCOUNTER — Other Ambulatory Visit: Payer: Self-pay | Admitting: Family

## 2017-09-23 DIAGNOSIS — F4310 Post-traumatic stress disorder, unspecified: Secondary | ICD-10-CM | POA: Diagnosis not present

## 2017-09-23 DIAGNOSIS — Z62891 Sibling rivalry: Secondary | ICD-10-CM | POA: Diagnosis not present

## 2017-09-23 DIAGNOSIS — Z9149 Other personal history of psychological trauma, not elsewhere classified: Secondary | ICD-10-CM | POA: Diagnosis not present

## 2017-09-23 NOTE — Telephone Encounter (Signed)
Rx's approved and sent to the pharmacy by e-script.//AB/CMA

## 2017-09-30 DIAGNOSIS — Z9149 Other personal history of psychological trauma, not elsewhere classified: Secondary | ICD-10-CM | POA: Diagnosis not present

## 2017-09-30 DIAGNOSIS — Z62891 Sibling rivalry: Secondary | ICD-10-CM | POA: Diagnosis not present

## 2017-09-30 DIAGNOSIS — F4310 Post-traumatic stress disorder, unspecified: Secondary | ICD-10-CM | POA: Diagnosis not present

## 2017-10-07 DIAGNOSIS — F4310 Post-traumatic stress disorder, unspecified: Secondary | ICD-10-CM | POA: Diagnosis not present

## 2017-10-07 DIAGNOSIS — Z62891 Sibling rivalry: Secondary | ICD-10-CM | POA: Diagnosis not present

## 2017-10-07 DIAGNOSIS — Z9149 Other personal history of psychological trauma, not elsewhere classified: Secondary | ICD-10-CM | POA: Diagnosis not present

## 2017-10-23 ENCOUNTER — Other Ambulatory Visit: Payer: Self-pay | Admitting: Family

## 2017-10-28 DIAGNOSIS — Z62891 Sibling rivalry: Secondary | ICD-10-CM | POA: Diagnosis not present

## 2017-10-28 DIAGNOSIS — Z9149 Other personal history of psychological trauma, not elsewhere classified: Secondary | ICD-10-CM | POA: Diagnosis not present

## 2017-10-28 DIAGNOSIS — F4310 Post-traumatic stress disorder, unspecified: Secondary | ICD-10-CM | POA: Diagnosis not present

## 2017-10-30 ENCOUNTER — Emergency Department (HOSPITAL_BASED_OUTPATIENT_CLINIC_OR_DEPARTMENT_OTHER): Payer: Medicare Other

## 2017-10-30 ENCOUNTER — Encounter (HOSPITAL_BASED_OUTPATIENT_CLINIC_OR_DEPARTMENT_OTHER): Payer: Self-pay

## 2017-10-30 ENCOUNTER — Emergency Department (HOSPITAL_BASED_OUTPATIENT_CLINIC_OR_DEPARTMENT_OTHER)
Admission: EM | Admit: 2017-10-30 | Discharge: 2017-10-30 | Disposition: A | Discharge status: Home / Self Care | Payer: Medicare Other | Attending: Emergency Medicine | Admitting: Emergency Medicine

## 2017-10-30 ENCOUNTER — Other Ambulatory Visit: Payer: Self-pay

## 2017-10-30 DIAGNOSIS — Z7902 Long term (current) use of antithrombotics/antiplatelets: Secondary | ICD-10-CM | POA: Insufficient documentation

## 2017-10-30 DIAGNOSIS — I2510 Atherosclerotic heart disease of native coronary artery without angina pectoris: Secondary | ICD-10-CM | POA: Insufficient documentation

## 2017-10-30 DIAGNOSIS — J209 Acute bronchitis, unspecified: Secondary | ICD-10-CM | POA: Diagnosis not present

## 2017-10-30 DIAGNOSIS — Z7984 Long term (current) use of oral hypoglycemic drugs: Secondary | ICD-10-CM | POA: Diagnosis not present

## 2017-10-30 DIAGNOSIS — E119 Type 2 diabetes mellitus without complications: Secondary | ICD-10-CM | POA: Insufficient documentation

## 2017-10-30 DIAGNOSIS — Z87891 Personal history of nicotine dependence: Secondary | ICD-10-CM | POA: Diagnosis not present

## 2017-10-30 DIAGNOSIS — Z79899 Other long term (current) drug therapy: Secondary | ICD-10-CM | POA: Diagnosis not present

## 2017-10-30 DIAGNOSIS — I10 Essential (primary) hypertension: Secondary | ICD-10-CM | POA: Diagnosis not present

## 2017-10-30 DIAGNOSIS — R05 Cough: Secondary | ICD-10-CM | POA: Diagnosis not present

## 2017-10-30 MED ORDER — ALBUTEROL SULFATE HFA 108 (90 BASE) MCG/ACT IN AERS
1.0000 | INHALATION_SPRAY | RESPIRATORY_TRACT | Status: DC | PRN
  Administered 2017-10-30: 2 via RESPIRATORY_TRACT
  Filled 2017-10-30: qty 6.7

## 2017-10-30 MED ORDER — IPRATROPIUM-ALBUTEROL 0.5-2.5 (3) MG/3ML IN SOLN
3.0000 mL | Freq: Once | RESPIRATORY_TRACT | Status: AC
  Administered 2017-10-30: 3 mL via RESPIRATORY_TRACT
  Filled 2017-10-30: qty 3

## 2017-10-30 MED ORDER — AZITHROMYCIN 250 MG PO TABS: ORAL_TABLET | ORAL | 0 refills | Status: DC

## 2017-10-30 MED ORDER — AZITHROMYCIN 250 MG PO TABS
500.0000 mg | ORAL_TABLET | Freq: Once | ORAL | Status: AC
  Administered 2017-10-30: 500 mg via ORAL
  Filled 2017-10-30: qty 2

## 2017-10-30 MED ORDER — AEROCHAMBER PLUS FLO-VU MEDIUM MISC
1.0000 | Freq: Once | Status: AC
  Administered 2017-10-30: 1
  Filled 2017-10-30: qty 1

## 2017-10-30 NOTE — ED Triage Notes (Signed)
C/o flu like sx day 2-NAD-steady gait 

## 2017-10-30 NOTE — ED Provider Notes (Signed)
[facility] EMERGENCY DEPARTMENT Provider Note   CSN: [id number] Arrival date & time: 10/30/17  1134     History   Chief Complaint Chief Complaint  Patient presents with  . Cough    HPI Steven Payne is a 71 y.o. male.  Pt presents to the ED with a cough that has been going on since 11/26.  The pt just came back from an 8 day [place] returning on 11/25.  The pt said he was fine when he came home and does not remember anyone sick next to him.  The pt's cough has been getting worse.  The pt has not taken anything otc.      Past Medical History:  Diagnosis Date  . Arthritis   . Coronary artery disease cardiologist-  dr Irish Lack   per cardiac cath 06/ 2013--  no sig. cad, lvef 60%, sluggish ectatic coronary artery flow of lad, lcx, rc  . ED (erectile dysfunction)   . GERD (gastroesophageal reflux disease)   . History of gout    approx 2002  . Hyperlipemia   . Hypertension   . Hypogonadism male   . Left hydrocele   . Type 2 diabetes mellitus [facility])     Patient Active Problem List   Diagnosis Date Noted  . ED (erectile dysfunction) 03/11/2017  . Diabetes type 2, controlled ([place]) 02/23/2016  . HTN (hypertension) 02/23/2016  . Hyperlipidemia 02/23/2016  . Gout 02/23/2016  . GERD (gastroesophageal reflux disease) 02/23/2016  . CAD (coronary artery disease) 02/23/2016    Past Surgical History:  Procedure Laterality Date  . APPENDECTOMY  age 29's  . CARDIAC CATHETERIZATION  05-08-2012  dr Elonda Husky at [facility]   no significant cad, sluggish ectatic coronary artery flow (rca, lad, lcx), normal LVSF, ef 60%  . HYDROCELE EXCISION Left 09/10/2016   Procedure: HYDROCELECTOMY ADULT;  Surgeon: Kathie Rhodes, MD;  Location: [facility];  Service: Urology;  Laterality: Left;  . UMBILICAL HERNIA REPAIR  1980's       Home Medications    Prior to Admission medications   Medication Sig Start Date End Date Taking? Authorizing Provider  atorvastatin  (LIPITOR) 40 MG tablet TAKE 1 TABLET DAILY 07/26/17   [name], NP  azithromycin (ZITHROMAX) 250 MG tablet Take 1 every day until finished. 10/31/17   Isla Pence, MD  clopidogrel (PLAVIX) 75 MG tablet TAKE 1 TABLET DAILY 10/23/17   [name], NP  diltiazem (CARDIZEM CD) 300 MG 24 hr capsule Take 300 mg by mouth daily.    [provider]  diltiazem (TIAZAC) 300 MG 24 hr capsule TAKE 1 CAPSULE EVERY MORNING 09/09/17   [name], NP  metFORMIN (GLUCOPHAGE) 1000 MG tablet TAKE 1 TABLET DAILY WITH BREAKFAST 09/23/17   [name], NP  potassium chloride SA (K-DUR,KLOR-CON) 20 MEQ tablet Take 1 tablet (20 mEq total) by mouth daily. 07/17/17   [name], NP  traMADol (ULTRAM) 50 MG tablet Take 1 tablet (50 mg total) by mouth every 8 (eight) hours as needed. 11/23/16   [name], NP  valsartan-hydrochlorothiazide (DIOVAN-HCT) 160-25 MG tablet TAKE 1 TABLET EVERY EVENING 09/23/17   [name], NP  Zoster Vac Recomb Adjuvanted [facility]) injection Inject 29mcg IM now and again in 2-6 months 07/15/17   [name], NP    Family History Family History  Problem Relation Age of Onset  . Coronary artery disease Father   . Diabetes Father   . Prostate cancer Father  died at 14  . Alcohol abuse Father   . Coronary artery disease Mother        died age 74  . Alcohol abuse Mother   . Cancer Mother        unsure, + thyroid cancer  . Breast cancer Paternal Grandmother     Social History Social History   Tobacco Use  . Smoking status: Former Smoker    Years: 1.00    Last attempt to quit: 03/27/1991    Years since quitting: 26.6  . Smokeless tobacco: Never Used  Substance Use Topics  . Alcohol use: No    Alcohol/week: 0.0 oz  . Drug use: No     Allergies   Tanzeum [albiglutide]   Review of Systems Review of Systems  Respiratory: Positive for cough, shortness of breath and wheezing.   All other  systems reviewed and are negative.    Physical Exam Updated Vital Signs BP (!) 153/80 (BP Location: Left Arm)   Pulse 76   Temp 98.7 F (37.1 C) (Oral)   Resp 18   Ht 5\' 7"  (1.702 m)   Wt 127.5 kg (281 lb)   SpO2 99%   BMI 44.01 kg/m   Physical Exam  Constitutional: He is oriented to person, place, and time. He appears well-developed and well-nourished.  HENT:  Head: Normocephalic and atraumatic.  Right Ear: External ear normal.  Left Ear: External ear normal.  Nose: Nose normal.  Mouth/Throat: Oropharynx is clear and moist.  Eyes: Conjunctivae and EOM are normal. Pupils are equal, round, and reactive to light.  Neck: Normal range of motion. Neck supple.  Cardiovascular: Normal rate, regular rhythm, normal heart sounds and intact distal pulses.  Pulmonary/Chest: Effort normal. He has wheezes.  Abdominal: Soft. Bowel sounds are normal.  Musculoskeletal: Normal range of motion.  Neurological: He is alert and oriented to person, place, and time.  Skin: Skin is warm and dry. Capillary refill takes less than 2 seconds.  Psychiatric: He has a normal mood and affect. His behavior is normal. Judgment and thought content normal.  Nursing note and vitals reviewed.    ED Treatments / Results  Labs (all labs ordered are listed, but only abnormal results are displayed) Labs Reviewed - No data to display  EKG  EKG Interpretation None       Radiology Dg Chest 2 View  Result Date: 10/30/2017 CLINICAL DATA:  Productive cough. EXAM: CHEST  2 VIEW COMPARISON:  Radiographs of November 08, 2015. FINDINGS: The heart size and mediastinal contours are within normal limits. Both lungs are clear. No pneumothorax or pleural effusion is noted. The visualized skeletal structures are unremarkable. IMPRESSION: No active cardiopulmonary disease. Electronically Signed   By: Marijo Conception, M.D.   On: 10/30/2017 12:11    Procedures Procedures (including critical care time)  Medications  Ordered in ED Medications  albuterol (PROVENTIL HFA;VENTOLIN HFA) 108 (90 Base) MCG/ACT inhaler 1-2 puff (not administered)  AEROCHAMBER PLUS FLO-VU MEDIUM MISC 1 each (not administered)  azithromycin (ZITHROMAX) tablet 500 mg (not administered)  ipratropium-albuterol (DUONEB) 0.5-2.5 (3) MG/3ML nebulizer solution 3 mL (3 mLs Nebulization Given 10/30/17 1219)     Initial Impression / Assessment and Plan / ED Course  I have reviewed the triage vital signs and the nursing notes.  Pertinent labs & imaging results that were available during my care of the patient were reviewed by me and considered in my medical decision making (see chart for details).    Pt is feeling  much better.  He is saturating well.  He will be started on zithromax as he has a worsening productive cough over the past few days.  He knows to return if worse and f/u with pcp.  Final Clinical Impressions(s) / ED Diagnoses   Final diagnoses:  Acute bronchitis, unspecified organism    ED Discharge Orders        Ordered    azithromycin (ZITHROMAX) 250 MG tablet     10/30/17 1221       Isla Pence, MD 10/30/17 1223

## 2017-11-04 ENCOUNTER — Ambulatory Visit (HOSPITAL_BASED_OUTPATIENT_CLINIC_OR_DEPARTMENT_OTHER)
Admission: RE | Admit: 2017-11-04 | Discharge: 2017-11-04 | Disposition: A | Discharge status: Home / Self Care | Payer: Medicare Other | Source: Ambulatory Visit | Attending: Family | Admitting: Family

## 2017-11-04 ENCOUNTER — Telehealth: Payer: Self-pay | Admitting: Family

## 2017-11-04 ENCOUNTER — Other Ambulatory Visit: Payer: Self-pay | Admitting: Family

## 2017-11-04 ENCOUNTER — Ambulatory Visit (INDEPENDENT_AMBULATORY_CARE_PROVIDER_SITE_OTHER): Payer: Medicare Other | Admitting: Family

## 2017-11-04 ENCOUNTER — Encounter: Payer: Self-pay | Admitting: Family

## 2017-11-04 ENCOUNTER — Other Ambulatory Visit: Payer: Self-pay

## 2017-11-04 VITALS — BP 135/76 | HR 67 | Temp 98.2°F | Resp 18 | Ht 67.0 in | Wt 278.6 lb

## 2017-11-04 DIAGNOSIS — R110 Nausea: Secondary | ICD-10-CM | POA: Diagnosis not present

## 2017-11-04 DIAGNOSIS — I10 Essential (primary) hypertension: Secondary | ICD-10-CM | POA: Diagnosis not present

## 2017-11-04 DIAGNOSIS — I25119 Atherosclerotic heart disease of native coronary artery with unspecified angina pectoris: Secondary | ICD-10-CM | POA: Diagnosis not present

## 2017-11-04 DIAGNOSIS — E785 Hyperlipidemia, unspecified: Secondary | ICD-10-CM | POA: Diagnosis not present

## 2017-11-04 DIAGNOSIS — E876 Hypokalemia: Secondary | ICD-10-CM

## 2017-11-04 DIAGNOSIS — R059 Cough, unspecified: Secondary | ICD-10-CM

## 2017-11-04 DIAGNOSIS — R05 Cough: Secondary | ICD-10-CM

## 2017-11-04 DIAGNOSIS — J209 Acute bronchitis, unspecified: Secondary | ICD-10-CM

## 2017-11-04 DIAGNOSIS — E119 Type 2 diabetes mellitus without complications: Secondary | ICD-10-CM

## 2017-11-04 LAB — COMPREHENSIVE METABOLIC PANEL
ALT: 22 U/L (ref 0–53)
AST: 26 U/L (ref 0–37)
Albumin: 4.3 g/dL (ref 3.5–5.2)
Alkaline Phosphatase: 70 U/L (ref 39–117)
BUN: 10 mg/dL (ref 6–23)
CO2: 26 mEq/L (ref 19–32)
Calcium: 9.3 mg/dL (ref 8.4–10.5)
Chloride: 102 mEq/L (ref 96–112)
Creatinine, Ser: 0.89 mg/dL (ref 0.40–1.50)
GFR: 107.74 mL/min (ref >60.00)
Glucose, Bld: 121 mg/dL — ABNORMAL HIGH (ref 70–99)
Potassium: 3.4 mEq/L — ABNORMAL LOW (ref 3.5–5.1)
Sodium: 139 mEq/L (ref 135–145)
Total Bilirubin: 0.5 mg/dL (ref 0.2–1.2)
Total Protein: 7.6 g/dL (ref 6.0–8.3)

## 2017-11-04 LAB — HEMOGLOBIN A1C: Hgb A1c MFr Bld: 6.5 % (ref 4.6–6.5)

## 2017-11-04 LAB — LIPASE: Lipase: 9 U/L — ABNORMAL LOW (ref 11.0–59.0)

## 2017-11-04 MED ORDER — PREDNISONE 10 MG PO TABS: ORAL_TABLET | ORAL | 0 refills | Status: DC

## 2017-11-04 MED ORDER — ATORVASTATIN CALCIUM 40 MG PO TABS: 40.0000 mg | ORAL_TABLET | Freq: Every day | ORAL | 3 refills | Status: DC

## 2017-11-04 MED ORDER — RANITIDINE HCL 150 MG PO TABS: 150.0000 mg | ORAL_TABLET | Freq: Two times a day (BID) | ORAL | 5 refills | Status: DC

## 2017-11-04 MED ORDER — POTASSIUM CHLORIDE CRYS ER 20 MEQ PO TBCR: 20.0000 meq | EXTENDED_RELEASE_TABLET | Freq: Every day | ORAL | 0 refills | Status: DC

## 2017-11-04 NOTE — Telephone Encounter (Signed)
K+ is a little low. Is he taking kdur daily?  Please take 2 tabs by mouth today then one tab once daily and repeat bmet in 1 week,

## 2017-11-04 NOTE — Progress Notes (Signed)
Subjective:    Patient ID: Steven Payne, male    DOB: 1946/01/07, 71 y.o.   MRN: [id number]  HPI  Mr. Alvizo is a 71 yr old male who presents today for follow up of multiple medical problems.  1) DM2- maintained on metformin. Reports diet needs improvement.   Lab Results  Component Value Date   HGBA1C 6.4 (H) 07/15/2017   HGBA1C 6.7 (H) 03/11/2017   HGBA1C 6.6 (H) 11/06/2016   Lab Results  Component Value Date   MICROALBUR 1.1 03/11/2017   LDLCALC 83 07/15/2017   CREATININE 0.76 07/15/2017   2) HTN- maintained on diltiazem, diovan hct.   BP Readings from Last 3 Encounters:  11/04/17 135/76  10/30/17 117/81  07/22/17 (!) 130/98   3) Hyperlipidemia- maintained on lipitor 40mg .   Lab Results  Component Value Date   CHOL 137 07/15/2017   HDL 32.40 (L) 07/15/2017   LDLCALC 83 07/15/2017   TRIG 110.0 07/15/2017   CHOLHDL 4 07/15/2017   4) GERD- not currently on PPI. Reports that he stopped protonix about 2 months ago, because he felt like it was affecting his memory.    5) Cough- reports cough/sob and wheezing since last Thursday after he received his second Shingrix. Reports + nausea since Friday.  Vomited x 1 on Friday. Denies abdominal pain.  Tolerating PO's.  Came back from a cruise last Sunday. Went ot ED on 11/28 and was treated with zpak and albuterol.  Review of Systems Past Medical History:  Diagnosis Date  . Arthritis   . Coronary artery disease cardiologist-  dr Irish Lack   per cardiac cath 06/ 2013--  no sig. cad, lvef 60%, sluggish ectatic coronary artery flow of lad, lcx, rc  . ED (erectile dysfunction)   . GERD (gastroesophageal reflux disease)   . History of gout    approx 2002  . Hyperlipemia   . Hypertension   . Hypogonadism male   . Left hydrocele   . Type 2 diabetes mellitus ([place])      Social History   Socioeconomic History  . Marital status: Married    Spouse name: Not on file  . Number of children: Not on file  . Years of education:  Not on file  . Highest education level: Not on file  Social Needs  . Financial resource strain: Not on file  . Food insecurity - worry: Not on file  . Food insecurity - inability: Not on file  . Transportation needs - medical: Not on file  . Transportation needs - non-medical: Not on file  Occupational History  . Not on file  Tobacco Use  . Smoking status: Former Smoker    Years: 1.00    Last attempt to quit: 03/27/1991    Years since quitting: 26.6  . Smokeless tobacco: Never Used  Substance and Sexual Activity  . Alcohol use: No    Alcohol/week: 0.0 oz  . Drug use: No  . Sexual activity: Not on file  Other Topics Concern  . 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  No grandchildren.  Oldest in [place], youngest in [place]   Completed bachelors, some graduate (Estate manager/land agent)   Enjoys travelling, walking, cruises    Past Surgical History:  Procedure Laterality Date  . APPENDECTOMY  age 43's  . CARDIAC CATHETERIZATION  05-08-2012  dr Elonda Husky at [facility]   no significant cad, sluggish ectatic coronary artery flow (rca, lad, lcx), normal LVSF, ef 60%  . HYDROCELE EXCISION Left 09/10/2016   Procedure: HYDROCELECTOMY ADULT;  Surgeon: Kathie Rhodes, MD;  [facility];  Service: Urology;  Laterality: Left;  . UMBILICAL HERNIA REPAIR  1980's    Family History  Problem Relation Age of Onset  . Coronary artery disease Father   . Diabetes Father   . Prostate cancer Father        died at 25  . Alcohol abuse Father   . Coronary artery disease Mother        died age 37  . Alcohol abuse Mother   . Cancer Mother        unsure, + thyroid cancer  . Breast cancer Paternal Grandmother     Allergies  Allergen Reactions  . Tanzeum [Albiglutide] Other (See Comments)    constipation    Current Outpatient Medications on  File Prior to Visit  Medication Sig Dispense Refill  . atorvastatin (LIPITOR) 40 MG tablet TAKE 1 TABLET DAILY 30 tablet 0  . clopidogrel (PLAVIX) 75 MG tablet TAKE 1 TABLET DAILY 90 tablet 1  . diltiazem (CARDIZEM CD) 300 MG 24 hr capsule Take 300 mg by mouth daily.    Marland Kitchen diltiazem (TIAZAC) 300 MG 24 hr capsule TAKE 1 CAPSULE EVERY MORNING 90 capsule 1  . metFORMIN (GLUCOPHAGE) 1000 MG tablet TAKE 1 TABLET DAILY WITH BREAKFAST 90 tablet 1  . potassium chloride SA (K-DUR,KLOR-CON) 20 MEQ tablet Take 1 tablet (20 mEq total) by mouth daily. 30 tablet 3  . traMADol (ULTRAM) 50 MG tablet Take 1 tablet (50 mg total) by mouth every 8 (eight) hours as needed. 30 tablet 0  . valsartan-hydrochlorothiazide (DIOVAN-HCT) 160-25 MG tablet TAKE 1 TABLET EVERY EVENING 90 tablet 1  . Zoster Vac Recomb Adjuvanted [facility]) injection Inject 79mcg IM now and again in 2-6 months 0.5 mL 1   No current facility-administered medications on file prior to visit.     BP 135/76 (BP Location: Right Arm, Cuff Size: Large)   Pulse 67   Temp 98.2 F (36.8 C) (Oral)   Resp 18   Ht 5\' 7"  (1.702 m)   Wt 278 lb 9.6 oz (126.4 kg)   SpO2 100%   BMI 43.63 kg/m       Objective:   Physical Exam  Constitutional: He is oriented to person, place, and time. He appears well-developed and well-nourished. No distress.  HENT:  Head: Normocephalic and atraumatic.  Cardiovascular: Normal rate and regular rhythm.  No murmur heard. Pulmonary/Chest: Effort normal and breath sounds normal. No respiratory distress. He has no rales.  Left sided expiratory wheeze  Abdominal: Soft. Bowel sounds are normal. He exhibits no distension. There is no tenderness. There is no rebound and no guarding.  Musculoskeletal: He exhibits no edema.  Neurological: He is alert and oriented to person, place, and time.  Skin: Skin is warm and dry.  Psychiatric: He has a normal mood and affect. His behavior is normal. Thought content normal.           Assessment & Plan:  Bronchitis with bronchospasm- just completed rx for zpak. Check CXR to exclude PNA.  Pt is advised as follows:   Continue albuterol every 6 hours until cough/wheezing improves. Complete chest x ray on the first floor.  Add mucinex 600mg  twice daily to help break up the mucous. Begin Prednisone for wheezing.  Check sugar twice daily while on prednisone, call if sugar >300.  DM2- clinically stable.  Continue metformin, obtain a1c.   Check sugar twice daily while on prednisone, call if sugar >300. rx gave for a new glucometer.   Hyperlipidemia- LDL at goal, continue statin.  Nausea- likely related to mild gastroenteritis. Declines zofran. Pt is advised as follows:   Make sure to drink plenty of fluids. If you develop abdominal pain or if you are unable to keep down food/liquid please go to the ER.   Let me know if your symptoms are not improved in 2-3 days.   HTN- BP stable, continue current meds.   GERD- uncontrolled. Could be contributing to nausea.

## 2017-11-04 NOTE — Telephone Encounter (Signed)
Notified pt. He has not been taking potassium only took it briefly. 30 day supply sent to local pharmacy. Lab appt scheduled for 11/11/17 at 7:30am and future order entered.

## 2017-11-04 NOTE — Patient Instructions (Addendum)
Please complete lab work prior to leaving.  Complete chest x ray on the first floor.  Add mucinex 600mg  twice daily to help break up the mucous. Begin Prednisone for wheezing. Check sugar twice daily while on prednisone, call if sugar >300. Start zantac 150mg  twice daily for reflux. Complete lab work prior to leaving.  Make sure to drink plenty of fluids. If you develop abdominal pain or if you are unable to keep down food/liquid please go to the ER.   Let me know if your symptoms are not improved in 2-3 days.

## 2017-11-11 ENCOUNTER — Other Ambulatory Visit: Payer: Medicare Other

## 2017-12-16 DIAGNOSIS — Z9149 Other personal history of psychological trauma, not elsewhere classified: Secondary | ICD-10-CM | POA: Diagnosis not present

## 2017-12-16 DIAGNOSIS — F4310 Post-traumatic stress disorder, unspecified: Secondary | ICD-10-CM | POA: Diagnosis not present

## 2017-12-16 DIAGNOSIS — Z62891 Sibling rivalry: Secondary | ICD-10-CM | POA: Diagnosis not present

## 2017-12-23 DIAGNOSIS — F4310 Post-traumatic stress disorder, unspecified: Secondary | ICD-10-CM | POA: Diagnosis not present

## 2017-12-23 DIAGNOSIS — Z9149 Other personal history of psychological trauma, not elsewhere classified: Secondary | ICD-10-CM | POA: Diagnosis not present

## 2017-12-23 DIAGNOSIS — Z62891 Sibling rivalry: Secondary | ICD-10-CM | POA: Diagnosis not present

## 2017-12-30 DIAGNOSIS — Z62891 Sibling rivalry: Secondary | ICD-10-CM | POA: Diagnosis not present

## 2017-12-30 DIAGNOSIS — F4310 Post-traumatic stress disorder, unspecified: Secondary | ICD-10-CM | POA: Diagnosis not present

## 2017-12-30 DIAGNOSIS — Z9149 Other personal history of psychological trauma, not elsewhere classified: Secondary | ICD-10-CM | POA: Diagnosis not present

## 2018-01-13 DIAGNOSIS — Z62891 Sibling rivalry: Secondary | ICD-10-CM | POA: Diagnosis not present

## 2018-01-13 DIAGNOSIS — Z9149 Other personal history of psychological trauma, not elsewhere classified: Secondary | ICD-10-CM | POA: Diagnosis not present

## 2018-01-13 DIAGNOSIS — F4310 Post-traumatic stress disorder, unspecified: Secondary | ICD-10-CM | POA: Diagnosis not present

## 2018-01-21 DIAGNOSIS — Z9149 Other personal history of psychological trauma, not elsewhere classified: Secondary | ICD-10-CM | POA: Diagnosis not present

## 2018-01-21 DIAGNOSIS — Z62891 Sibling rivalry: Secondary | ICD-10-CM | POA: Diagnosis not present

## 2018-01-21 DIAGNOSIS — F4310 Post-traumatic stress disorder, unspecified: Secondary | ICD-10-CM | POA: Diagnosis not present

## 2018-01-27 DIAGNOSIS — Z9149 Other personal history of psychological trauma, not elsewhere classified: Secondary | ICD-10-CM | POA: Diagnosis not present

## 2018-01-27 DIAGNOSIS — Z62891 Sibling rivalry: Secondary | ICD-10-CM | POA: Diagnosis not present

## 2018-01-27 DIAGNOSIS — F4310 Post-traumatic stress disorder, unspecified: Secondary | ICD-10-CM | POA: Diagnosis not present

## 2018-02-03 DIAGNOSIS — F4310 Post-traumatic stress disorder, unspecified: Secondary | ICD-10-CM | POA: Diagnosis not present

## 2018-02-03 DIAGNOSIS — Z9149 Other personal history of psychological trauma, not elsewhere classified: Secondary | ICD-10-CM | POA: Diagnosis not present

## 2018-02-03 DIAGNOSIS — Z62891 Sibling rivalry: Secondary | ICD-10-CM | POA: Diagnosis not present

## 2018-02-10 DIAGNOSIS — Z9149 Other personal history of psychological trauma, not elsewhere classified: Secondary | ICD-10-CM | POA: Diagnosis not present

## 2018-02-10 DIAGNOSIS — Z62891 Sibling rivalry: Secondary | ICD-10-CM | POA: Diagnosis not present

## 2018-02-10 DIAGNOSIS — F4310 Post-traumatic stress disorder, unspecified: Secondary | ICD-10-CM | POA: Diagnosis not present

## 2018-02-17 DIAGNOSIS — F4310 Post-traumatic stress disorder, unspecified: Secondary | ICD-10-CM | POA: Diagnosis not present

## 2018-02-17 DIAGNOSIS — Z9149 Other personal history of psychological trauma, not elsewhere classified: Secondary | ICD-10-CM | POA: Diagnosis not present

## 2018-02-17 DIAGNOSIS — Z62891 Sibling rivalry: Secondary | ICD-10-CM | POA: Diagnosis not present

## 2018-02-24 DIAGNOSIS — Z62891 Sibling rivalry: Secondary | ICD-10-CM | POA: Diagnosis not present

## 2018-02-24 DIAGNOSIS — F4310 Post-traumatic stress disorder, unspecified: Secondary | ICD-10-CM | POA: Diagnosis not present

## 2018-02-24 DIAGNOSIS — Z9149 Other personal history of psychological trauma, not elsewhere classified: Secondary | ICD-10-CM | POA: Diagnosis not present

## 2018-03-08 ENCOUNTER — Other Ambulatory Visit: Payer: Self-pay | Admitting: Family

## 2018-03-11 NOTE — Telephone Encounter (Signed)
Diltiazem refills sent to mail order. Mychart message sent to pt that he is due now for 3 month follow up.

## 2018-03-17 DIAGNOSIS — Z9149 Other personal history of psychological trauma, not elsewhere classified: Secondary | ICD-10-CM | POA: Diagnosis not present

## 2018-03-17 DIAGNOSIS — Z62891 Sibling rivalry: Secondary | ICD-10-CM | POA: Diagnosis not present

## 2018-03-17 DIAGNOSIS — F4310 Post-traumatic stress disorder, unspecified: Secondary | ICD-10-CM | POA: Diagnosis not present

## 2018-03-22 ENCOUNTER — Other Ambulatory Visit: Payer: Self-pay | Admitting: Family

## 2018-03-24 DIAGNOSIS — Z9149 Other personal history of psychological trauma, not elsewhere classified: Secondary | ICD-10-CM | POA: Diagnosis not present

## 2018-03-24 DIAGNOSIS — F4310 Post-traumatic stress disorder, unspecified: Secondary | ICD-10-CM | POA: Diagnosis not present

## 2018-03-24 DIAGNOSIS — Z62891 Sibling rivalry: Secondary | ICD-10-CM | POA: Diagnosis not present

## 2018-03-31 DIAGNOSIS — Z9149 Other personal history of psychological trauma, not elsewhere classified: Secondary | ICD-10-CM | POA: Diagnosis not present

## 2018-03-31 DIAGNOSIS — Z62891 Sibling rivalry: Secondary | ICD-10-CM | POA: Diagnosis not present

## 2018-03-31 DIAGNOSIS — F4310 Post-traumatic stress disorder, unspecified: Secondary | ICD-10-CM | POA: Diagnosis not present

## 2018-04-07 DIAGNOSIS — Z62891 Sibling rivalry: Secondary | ICD-10-CM | POA: Diagnosis not present

## 2018-04-07 DIAGNOSIS — F4310 Post-traumatic stress disorder, unspecified: Secondary | ICD-10-CM | POA: Diagnosis not present

## 2018-04-07 DIAGNOSIS — Z9149 Other personal history of psychological trauma, not elsewhere classified: Secondary | ICD-10-CM | POA: Diagnosis not present

## 2018-04-14 DIAGNOSIS — Z62891 Sibling rivalry: Secondary | ICD-10-CM | POA: Diagnosis not present

## 2018-04-14 DIAGNOSIS — F4310 Post-traumatic stress disorder, unspecified: Secondary | ICD-10-CM | POA: Diagnosis not present

## 2018-04-14 DIAGNOSIS — Z9149 Other personal history of psychological trauma, not elsewhere classified: Secondary | ICD-10-CM | POA: Diagnosis not present

## 2018-05-05 DIAGNOSIS — Z9149 Other personal history of psychological trauma, not elsewhere classified: Secondary | ICD-10-CM | POA: Diagnosis not present

## 2018-05-05 DIAGNOSIS — F4310 Post-traumatic stress disorder, unspecified: Secondary | ICD-10-CM | POA: Diagnosis not present

## 2018-05-05 DIAGNOSIS — Z62891 Sibling rivalry: Secondary | ICD-10-CM | POA: Diagnosis not present

## 2018-05-15 ENCOUNTER — Encounter: Payer: Self-pay | Admitting: Family

## 2018-05-19 DIAGNOSIS — F4310 Post-traumatic stress disorder, unspecified: Secondary | ICD-10-CM | POA: Diagnosis not present

## 2018-05-19 DIAGNOSIS — Z62891 Sibling rivalry: Secondary | ICD-10-CM | POA: Diagnosis not present

## 2018-05-19 DIAGNOSIS — Z9149 Other personal history of psychological trauma, not elsewhere classified: Secondary | ICD-10-CM | POA: Diagnosis not present

## 2018-05-20 ENCOUNTER — Ambulatory Visit: Payer: Medicare Other | Admitting: Family

## 2018-05-20 DIAGNOSIS — Z0289 Encounter for other administrative examinations: Secondary | ICD-10-CM

## 2018-05-24 ENCOUNTER — Encounter: Payer: Self-pay | Admitting: Family

## 2018-05-26 DIAGNOSIS — Z9149 Other personal history of psychological trauma, not elsewhere classified: Secondary | ICD-10-CM | POA: Diagnosis not present

## 2018-05-26 DIAGNOSIS — Z62891 Sibling rivalry: Secondary | ICD-10-CM | POA: Diagnosis not present

## 2018-05-26 DIAGNOSIS — F4310 Post-traumatic stress disorder, unspecified: Secondary | ICD-10-CM | POA: Diagnosis not present

## 2018-06-02 ENCOUNTER — Ambulatory Visit (INDEPENDENT_AMBULATORY_CARE_PROVIDER_SITE_OTHER): Payer: Medicare Other | Admitting: Family

## 2018-06-02 ENCOUNTER — Encounter: Payer: Self-pay | Admitting: Family

## 2018-06-02 VITALS — BP 146/71 | HR 59 | Temp 98.4°F | Resp 16 | Ht 67.0 in | Wt 284.8 lb

## 2018-06-02 DIAGNOSIS — E785 Hyperlipidemia, unspecified: Secondary | ICD-10-CM

## 2018-06-02 DIAGNOSIS — E119 Type 2 diabetes mellitus without complications: Secondary | ICD-10-CM | POA: Diagnosis not present

## 2018-06-02 DIAGNOSIS — K219 Gastro-esophageal reflux disease without esophagitis: Secondary | ICD-10-CM | POA: Diagnosis not present

## 2018-06-02 DIAGNOSIS — I10 Essential (primary) hypertension: Secondary | ICD-10-CM

## 2018-06-02 LAB — BASIC METABOLIC PANEL
BUN: 8 mg/dL (ref 6–23)
CO2: 28 mEq/L (ref 19–32)
Calcium: 8.8 mg/dL (ref 8.4–10.5)
Chloride: 102 mEq/L (ref 96–112)
Creatinine, Ser: 0.85 mg/dL (ref 0.40–1.50)
GFR: 113.43 mL/min (ref >60.00)
Glucose, Bld: 116 mg/dL — ABNORMAL HIGH (ref 70–99)
Potassium: 3.5 mEq/L (ref 3.5–5.1)
Sodium: 140 mEq/L (ref 135–145)

## 2018-06-02 LAB — LIPID PANEL
Cholesterol: 142 mg/dL (ref 0–200)
HDL: 33 mg/dL — ABNORMAL LOW (ref >39.00)
LDL Cholesterol: 85 mg/dL (ref 0–99)
NonHDL: 109.22
Total CHOL/HDL Ratio: 4
Triglycerides: 121 mg/dL (ref 0.0–149.0)
VLDL: 24.2 mg/dL (ref 0.0–40.0)

## 2018-06-02 LAB — HEPATIC FUNCTION PANEL
ALT: 14 U/L (ref 0–53)
AST: 18 U/L (ref 0–37)
Albumin: 4.1 g/dL (ref 3.5–5.2)
Alkaline Phosphatase: 85 U/L (ref 39–117)
Bilirubin, Direct: 0.1 mg/dL (ref 0.0–0.3)
Total Bilirubin: 0.7 mg/dL (ref 0.2–1.2)
Total Protein: 6.6 g/dL (ref 6.0–8.3)

## 2018-06-02 LAB — HEMOGLOBIN A1C: Hgb A1c MFr Bld: 7.5 % — ABNORMAL HIGH (ref 4.6–6.5)

## 2018-06-02 MED ORDER — RANITIDINE HCL 150 MG PO TABS: 150.0000 mg | ORAL_TABLET | Freq: Two times a day (BID) | ORAL | 1 refills | Status: DC

## 2018-06-02 MED ORDER — CLOPIDOGREL BISULFATE 75 MG PO TABS: 75.0000 mg | ORAL_TABLET | Freq: Every day | ORAL | 1 refills | Status: DC

## 2018-06-02 MED ORDER — METFORMIN HCL 500 MG PO TABS: 500.0000 mg | ORAL_TABLET | Freq: Two times a day (BID) | ORAL | 1 refills | Status: DC

## 2018-06-02 MED ORDER — POTASSIUM CHLORIDE CRYS ER 20 MEQ PO TBCR: 20.0000 meq | EXTENDED_RELEASE_TABLET | Freq: Every day | ORAL | 1 refills | Status: DC

## 2018-06-02 MED ORDER — VALSARTAN-HYDROCHLOROTHIAZIDE 160-25 MG PO TABS: 1.0000 | ORAL_TABLET | Freq: Every evening | ORAL | 1 refills | Status: DC

## 2018-06-02 NOTE — Patient Instructions (Signed)
Please send me your blood pressure readings via mychart.  Complete lab work prior to leaving.

## 2018-06-02 NOTE — Progress Notes (Signed)
Subjective:    Patient ID: Steven Payne, male    DOB: 04/23/1946, 72 y.o.   MRN: [id number]  HPI  Patient is a 72 yr old male who presents today for follow up.  DM2- reports that he checks sugars 2x a week, generally <110.   Lab Results  Component Value Date   HGBA1C 6.5 11/04/2017   HGBA1C 6.4 (H) 07/15/2017   HGBA1C 6.7 (H) 03/11/2017   Lab Results  Component Value Date   MICROALBUR 1.1 03/11/2017   LDLCALC 83 07/15/2017   CREATININE 0.89 11/04/2017   HTN- reports that he has been eating more salt lately.  On diovan hct and diltiazem.  BP Readings from Last 3 Encounters:  06/02/18 (!) 146/71  11/04/17 135/76  10/30/17 117/81    Hyperlipidemia- maintained on lipitor. Denies myalgia. Lab Results  Component Value Date   CHOL 137 07/15/2017   HDL 32.40 (L) 07/15/2017   LDLCALC 83 07/15/2017   TRIG 110.0 07/15/2017   CHOLHDL 4 07/15/2017   GERD- reports that he is only using zantac prn. Reports that he has improved his diet and this seems to be stable.   Review of Systems See HPI  Past Medical History:  Diagnosis Date  . Arthritis   . Coronary artery disease cardiologist-  dr Irish Lack   per cardiac cath 06/ 2013--  no sig. cad, lvef 60%, sluggish ectatic coronary artery flow of lad, lcx, rc  . ED (erectile dysfunction)   . GERD (gastroesophageal reflux disease)   . History of gout    approx 2002  . Hyperlipemia   . Hypertension   . Hypogonadism male   . Left hydrocele   . Type 2 diabetes mellitus ([place])      Social History   Socioeconomic History  . Marital status: Married    Spouse name: Not on file  . Number of children: Not on file  . Years of education: Not on file  . Highest education level: Not on file  Occupational History  . Not on file  Social Needs  . Financial resource strain: Not on file  . Food insecurity:    Worry: Not on file    Inability: Not on file  . Transportation needs:    Medical: Not on file    Non-medical: Not on  file  Tobacco Use  . Smoking status: Former Smoker    Years: 1.00    Last attempt to quit: 03/27/1991    Years since quitting: 27.2  . Smokeless tobacco: Never Used  Substance and Sexual Activity  . Alcohol use: No    Alcohol/week: 0.0 oz  . Drug use: No  . Sexual activity: Not on file  Lifestyle  . Physical activity:    Days per week: Not on file    Minutes per session: Not on file  . Stress: Not on file  Relationships  . Social connections:    Talks on phone: Not on file    Gets together: Not on file    Attends religious service: Not on file    Active member of club or organization: Not on file    Attends meetings of clubs or organizations: Not on file    Relationship status: Not on file  . Intimate partner violence:    Fear of current or ex partner: Not on file    Emotionally abused: Not on file    Physically abused: Not on file    Forced sexual activity: Not on file  Other  Topics Concern  . 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  No grandchildren.  Oldest in [place], youngest in [place]   Completed bachelors, some graduate (Estate manager/land agent)   Enjoys travelling, walking, cruises    Past Surgical History:  Procedure Laterality Date  . APPENDECTOMY  age 29's  . CARDIAC CATHETERIZATION  05-08-2012  dr Elonda Husky at [facility]   no significant cad, sluggish ectatic coronary artery flow (rca, lad, lcx), normal LVSF, ef 60%  . HYDROCELE EXCISION Left 09/10/2016   Procedure: HYDROCELECTOMY ADULT;  Surgeon: Kathie Rhodes, MD;  Location: [facility];  Service: Urology;  Laterality: Left;  . UMBILICAL HERNIA REPAIR  1980's    Family History  Problem Relation Age of Onset  . Coronary artery disease Father   . Diabetes Father   . Prostate cancer Father        died at 58  . Alcohol abuse Father   . Coronary artery disease  Mother        died age 32  . Alcohol abuse Mother   . Cancer Mother        unsure, + thyroid cancer  . Breast cancer Paternal Grandmother     Allergies  Allergen Reactions  . Tanzeum [Albiglutide] Other (See Comments)    constipation    Current Outpatient Medications on File Prior to Visit  Medication Sig Dispense Refill  . atorvastatin (LIPITOR) 40 MG tablet Take 1 tablet (40 mg total) by mouth daily. 90 tablet 3  . clopidogrel (PLAVIX) 75 MG tablet TAKE 1 TABLET DAILY 90 tablet 1  . diltiazem (TIAZAC) 300 MG 24 hr capsule TAKE 1 CAPSULE EVERY MORNING 90 capsule 1  . metFORMIN (GLUCOPHAGE) 1000 MG tablet TAKE 1 TABLET DAILY WITH BREAKFAST 90 tablet 0  . ranitidine (ZANTAC) 150 MG tablet Take 1 tablet (150 mg total) by mouth 2 (two) times daily. 60 tablet 5  . valsartan-hydrochlorothiazide (DIOVAN-HCT) 160-25 MG tablet TAKE 1 TABLET EVERY EVENING 90 tablet 0  . potassium chloride SA (K-DUR,KLOR-CON) 20 MEQ tablet Take 1 tablet (20 mEq total) by mouth daily. (Patient not taking: Reported on 06/02/2018) 30 tablet 0   No current facility-administered medications on file prior to visit.     BP (!) 146/71 (BP Location: Right Arm, Patient Position: Sitting, Cuff Size: Large)   Pulse (!) 59   Temp 98.4 F (36.9 C) (Oral)   Resp 16   Ht 5\' 7"  (1.702 m)   Wt 284 lb 12.8 oz (129.2 kg)   SpO2 100%   BMI 44.61 kg/m       Objective:   Physical Exam  Constitutional: He is oriented to person, place, and time. He appears well-developed and well-nourished. No distress.  HENT:  Head: Normocephalic and atraumatic.  Cardiovascular: Normal rate and regular rhythm.  No murmur heard. Pulmonary/Chest: Effort normal and breath sounds normal. No respiratory distress. He has no wheezes. He has no rales.  Musculoskeletal: He exhibits no edema.  Neurological: He is alert and oriented to person, place, and time.  Skin: Skin is warm and dry.  Psychiatric: He has a normal mood and affect. His  behavior is normal. Thought content normal.          Assessment & Plan:  DM2- he is taking metformin 1000mg  once daily. Will change to 500mg  bid. He  is working with a nutritionist.  Hyperlipidemia- tolerating statin, obtain follow up lipid panel.  HTN- Bp has been better at home per patient report. DBP mildly elevated today. Asked pt to send me his home readings via mychart.  He is also advised to restart kdur.  GERD- stable with prn zantac. He has made dietary modifications which have helped.

## 2018-06-03 ENCOUNTER — Telehealth: Payer: Self-pay | Admitting: Family

## 2018-06-03 MED ORDER — METFORMIN HCL 1000 MG PO TABS: 1000.0000 mg | ORAL_TABLET | Freq: Two times a day (BID) | ORAL | 1 refills | Status: DC

## 2018-06-03 NOTE — Telephone Encounter (Signed)
Sugar is up. I [facility] metformin [facility]mg  [facility].  I am sending new rx for 1000mg  bid.  He can take 2 of the 500's bid if he receives this first.

## 2018-06-04 NOTE — Telephone Encounter (Signed)
Spoke with Allena Katz at [facility] and he verified that metformin 500mg  had already shipped but has been marked to d/c and further refills will not be available to pt. They also received Rx for the 1000mg  twice a day and it will be shipped over the next couple of days. Attempted to reach pt and left message to check mychart acct. Message sent.

## 2018-06-09 DIAGNOSIS — Z9149 Other personal history of psychological trauma, not elsewhere classified: Secondary | ICD-10-CM | POA: Diagnosis not present

## 2018-06-09 DIAGNOSIS — F4310 Post-traumatic stress disorder, unspecified: Secondary | ICD-10-CM | POA: Diagnosis not present

## 2018-06-09 DIAGNOSIS — Z62891 Sibling rivalry: Secondary | ICD-10-CM | POA: Diagnosis not present

## 2018-06-30 DIAGNOSIS — Z9149 Other personal history of psychological trauma, not elsewhere classified: Secondary | ICD-10-CM | POA: Diagnosis not present

## 2018-06-30 DIAGNOSIS — F4310 Post-traumatic stress disorder, unspecified: Secondary | ICD-10-CM | POA: Diagnosis not present

## 2018-06-30 DIAGNOSIS — Z62891 Sibling rivalry: Secondary | ICD-10-CM | POA: Diagnosis not present

## 2018-07-07 DIAGNOSIS — Z9149 Other personal history of psychological trauma, not elsewhere classified: Secondary | ICD-10-CM | POA: Diagnosis not present

## 2018-07-07 DIAGNOSIS — Z62891 Sibling rivalry: Secondary | ICD-10-CM | POA: Diagnosis not present

## 2018-07-07 DIAGNOSIS — F4310 Post-traumatic stress disorder, unspecified: Secondary | ICD-10-CM | POA: Diagnosis not present

## 2018-07-14 DIAGNOSIS — Z9149 Other personal history of psychological trauma, not elsewhere classified: Secondary | ICD-10-CM | POA: Diagnosis not present

## 2018-07-14 DIAGNOSIS — F4310 Post-traumatic stress disorder, unspecified: Secondary | ICD-10-CM | POA: Diagnosis not present

## 2018-07-14 DIAGNOSIS — Z62891 Sibling rivalry: Secondary | ICD-10-CM | POA: Diagnosis not present

## 2018-08-18 DIAGNOSIS — F4310 Post-traumatic stress disorder, unspecified: Secondary | ICD-10-CM | POA: Diagnosis not present

## 2018-08-18 DIAGNOSIS — Z9149 Other personal history of psychological trauma, not elsewhere classified: Secondary | ICD-10-CM | POA: Diagnosis not present

## 2018-08-18 DIAGNOSIS — Z62891 Sibling rivalry: Secondary | ICD-10-CM | POA: Diagnosis not present

## 2018-09-01 DIAGNOSIS — Z9149 Other personal history of psychological trauma, not elsewhere classified: Secondary | ICD-10-CM | POA: Diagnosis not present

## 2018-09-01 DIAGNOSIS — Z62891 Sibling rivalry: Secondary | ICD-10-CM | POA: Diagnosis not present

## 2018-09-01 DIAGNOSIS — F4310 Post-traumatic stress disorder, unspecified: Secondary | ICD-10-CM | POA: Diagnosis not present

## 2018-09-07 ENCOUNTER — Other Ambulatory Visit: Payer: Self-pay | Admitting: Family

## 2018-09-08 ENCOUNTER — Ambulatory Visit (INDEPENDENT_AMBULATORY_CARE_PROVIDER_SITE_OTHER): Payer: Medicare Other | Admitting: Family

## 2018-09-08 ENCOUNTER — Encounter: Payer: Self-pay | Admitting: Family

## 2018-09-08 ENCOUNTER — Telehealth: Payer: Self-pay | Admitting: Family

## 2018-09-08 VITALS — BP 127/72 | HR 70 | Temp 98.5°F | Resp 16 | Ht 67.0 in | Wt 279.0 lb

## 2018-09-08 DIAGNOSIS — I25119 Atherosclerotic heart disease of native coronary artery with unspecified angina pectoris: Secondary | ICD-10-CM | POA: Diagnosis not present

## 2018-09-08 DIAGNOSIS — E119 Type 2 diabetes mellitus without complications: Secondary | ICD-10-CM | POA: Diagnosis not present

## 2018-09-08 DIAGNOSIS — I10 Essential (primary) hypertension: Secondary | ICD-10-CM | POA: Diagnosis not present

## 2018-09-08 DIAGNOSIS — E785 Hyperlipidemia, unspecified: Secondary | ICD-10-CM | POA: Diagnosis not present

## 2018-09-08 DIAGNOSIS — R5383 Other fatigue: Secondary | ICD-10-CM

## 2018-09-08 DIAGNOSIS — Z23 Encounter for immunization: Secondary | ICD-10-CM

## 2018-09-08 DIAGNOSIS — R400 Somnolence: Secondary | ICD-10-CM

## 2018-09-08 LAB — BASIC METABOLIC PANEL
BUN: 10 mg/dL (ref 6–23)
CO2: 29 mEq/L (ref 19–32)
Calcium: 8.8 mg/dL (ref 8.4–10.5)
Chloride: 101 mEq/L (ref 96–112)
Creatinine, Ser: 0.81 mg/dL (ref 0.40–1.50)
GFR: 119.83 mL/min (ref >60.00)
Glucose, Bld: 126 mg/dL — ABNORMAL HIGH (ref 70–99)
Potassium: 3.8 mEq/L (ref 3.5–5.1)
Sodium: 139 mEq/L (ref 135–145)

## 2018-09-08 LAB — HEMOGLOBIN A1C: Hgb A1c MFr Bld: 6.9 % — ABNORMAL HIGH (ref 4.6–6.5)

## 2018-09-08 NOTE — Patient Instructions (Signed)
Please complete lab work prior to leaving.  You should be contacted about your referral for sleep study.

## 2018-09-08 NOTE — Progress Notes (Signed)
Subjective:    Patient ID: Steven Payne, male    DOB: [birth year], 72 y.o.   MRN: [id number]  HPI   DM2- reports that his sugars "are all over the place."  On metformin.  Wt Readings from Last 3 Encounters:  09/08/18 279 lb (126.6 kg)  06/02/18 284 lb 12.8 oz (129.2 kg)  11/04/17 278 lb 9.6 oz (126.4 kg)    Lab Results  Component Value Date   HGBA1C 7.5 (H) 06/02/2018   HGBA1C 6.5 11/04/2017   HGBA1C 6.4 (H) 07/15/2017   Lab Results  Component Value Date   MICROALBUR 1.1 03/11/2017   [place] 85 06/02/2018   CREATININE 0.85 06/02/2018   Hyperlipidemia- maintained on statin.   Lab Results  Component Value Date   CHOL 142 06/02/2018   HDL 33.00 (L) 06/02/2018   LDLCALC 85 06/02/2018   TRIG 121.0 06/02/2018   CHOLHDL 4 06/02/2018   HTN- maintained on diltiazem, diovan-hct.   gerd- reports resolution of gerd symptoms.   + sleepiness.  + snoring, low energy, low libido  Review of Systems See HPI  Past Medical History:  Diagnosis Date  . Arthritis   . Coronary artery disease cardiologist-  dr Irish Lack   per cardiac cath 06/ 2013--  no sig. cad, lvef 60%, sluggish ectatic coronary artery flow of lad, lcx, rc  . ED (erectile dysfunction)   . GERD (gastroesophageal reflux disease)   . History of gout    approx 2002  . Hyperlipemia   . Hypertension   . Hypogonadism male   . Left hydrocele   . Type 2 diabetes mellitus ([place])      Social History   Socioeconomic History  . Marital status: Married    Spouse name: Not on file  . Number of children: Not on file  . Years of education: Not on file  . Highest education level: Not on file  Occupational History  . Not on file  Social Needs  . Financial resource strain: Not on file  . Food insecurity:    Worry: Not on file    Inability: Not on file  . Transportation needs:    Medical: Not on file    Non-medical: Not on file  Tobacco Use  . Smoking status: Former Smoker    Years: 1.00    Last attempt to  quit: 03/27/1991    Years since quitting: 27.4  . Smokeless tobacco: Never Used  Substance and Sexual Activity  . Alcohol use: No    Alcohol/week: 0.0 standard drinks  . Drug use: No  . Sexual activity: Not on file  Lifestyle  . Physical activity:    Days per week: Not on file    Minutes per session: Not on file  . Stress: Not on file  Relationships  . Social connections:    Talks on phone: Not on file    Gets together: Not on file    Attends religious service: Not on file    Active member of club or organization: Not on file    Attends meetings of clubs or organizations: Not on file    Relationship status: Not on file  . Intimate partner violence:    Fear of current or ex partner: Not on file    Emotionally abused: Not on file    Physically abused: Not on file    Forced sexual activity: Not on file  Other Topics Concern  . 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  No grandchildren.  Oldest in [place], youngest in [place]   Completed bachelors, some graduate (Estate manager/land agent)   Enjoys travelling, walking, cruises    Past Surgical History:  Procedure Laterality Date  . APPENDECTOMY  age 61's  . CARDIAC CATHETERIZATION  05-08-2012  dr Elonda Husky at [facility]   no significant cad, sluggish ectatic coronary artery flow (rca, lad, lcx), normal LVSF, ef 60%  . HYDROCELE EXCISION Left 09/10/2016   Procedure: HYDROCELECTOMY ADULT;  Surgeon: Kathie Rhodes, MD;  Location: [facility];  Service: Urology;  Laterality: Left;  . UMBILICAL HERNIA REPAIR  1980's    Family History  Problem Relation Age of Onset  . Coronary artery disease Father   . Diabetes Father   . Prostate cancer Father        died at 55  . Alcohol abuse Father   . Coronary artery disease Mother        died age 31  . Alcohol abuse Mother   . Cancer Mother         unsure, + thyroid cancer  . Breast cancer Paternal Grandmother     Allergies  Allergen Reactions  . Tanzeum [Albiglutide] Other (See Comments)    constipation    Current Outpatient Medications on File Prior to Visit  Medication Sig Dispense Refill  . atorvastatin (LIPITOR) 40 MG tablet Take 1 tablet (40 mg total) by mouth daily. 90 tablet 3  . clopidogrel (PLAVIX) 75 MG tablet Take 1 tablet (75 mg total) by mouth daily. 90 tablet 1  . diltiazem (TIAZAC) 300 MG 24 hr capsule TAKE 1 CAPSULE EVERY MORNING 90 capsule 1  . metFORMIN (GLUCOPHAGE) 1000 MG tablet Take 1 tablet (1,000 mg total) by mouth 2 (two) times daily with a meal. 180 tablet 1  . potassium chloride SA (K-DUR,KLOR-CON) 20 MEQ tablet Take 1 tablet (20 mEq total) by mouth daily. 90 tablet 1  . ranitidine (ZANTAC) 150 MG tablet Take 1 tablet (150 mg total) by mouth 2 (two) times daily. 180 tablet 1  . valsartan-hydrochlorothiazide (DIOVAN-HCT) 160-25 MG tablet Take 1 tablet by mouth every evening. 90 tablet 1   No current facility-administered medications on file prior to visit.     BP 127/72 (BP Location: Right Arm, Patient Position: Sitting, Cuff Size: Large)   Pulse 70   Temp 98.5 F (36.9 C) (Oral)   Resp 16   Ht 5\' 7"  (1.702 m)   Wt 279 lb (126.6 kg)   SpO2 96%   BMI 43.70 kg/m       Objective:   Physical Exam  Constitutional: He is oriented to person, place, and time. He appears well-developed and well-nourished. No distress.  HENT:  Head: Normocephalic and atraumatic.  Cardiovascular: Normal rate and regular rhythm.  No murmur heard. Pulmonary/Chest: Effort normal and breath sounds normal. No respiratory distress. He has no wheezes. He has no rales.  Musculoskeletal: He exhibits no edema.  Neurological: He is alert and oriented to person, place, and time.  Skin: Skin is warm and dry.  Psychiatric: He has a normal mood and affect. His behavior is normal. Thought content normal.          Assessment  & Plan:  DM2- discussed diet/exercise/weight loss.  Obtain follow up a1c. Flu shot and pneumovax booster today.   Hyperlipidemia- ldl at goal on statin.  Hypertension- BP at goal on current meds. Continue same.  Fatigue- will send for sleep study. Check testosterone level.  CAD- clinically stable. On plavix. Due for follow up with cardiology.

## 2018-09-08 NOTE — Telephone Encounter (Signed)
See mychart.  

## 2018-09-09 LAB — TESTOSTERONE TOTAL,FREE,BIO, MALES
Albumin: 3.9 g/dL (ref 3.6–5.1)
Sex Hormone Binding: 15 nmol/L — ABNORMAL LOW (ref 22–77)
Testosterone: 89 ng/dL — ABNORMAL LOW (ref 250–827)

## 2018-09-10 ENCOUNTER — Telehealth: Payer: Self-pay | Admitting: Family

## 2018-09-10 DIAGNOSIS — E291 Testicular hypofunction: Secondary | ICD-10-CM

## 2018-09-10 NOTE — Telephone Encounter (Signed)
Results given to patient. He said he will call urology.

## 2018-09-10 NOTE — Telephone Encounter (Signed)
Please contact patient and let him know that his testosterone level is low.  I am going to get him back in with urology to address this.  Sugar has improved.  Kidney function electrolytes look good.

## 2018-09-15 DIAGNOSIS — Z9149 Other personal history of psychological trauma, not elsewhere classified: Secondary | ICD-10-CM | POA: Diagnosis not present

## 2018-09-15 DIAGNOSIS — F4310 Post-traumatic stress disorder, unspecified: Secondary | ICD-10-CM | POA: Diagnosis not present

## 2018-09-15 DIAGNOSIS — Z62891 Sibling rivalry: Secondary | ICD-10-CM | POA: Diagnosis not present

## 2018-09-22 DIAGNOSIS — F4310 Post-traumatic stress disorder, unspecified: Secondary | ICD-10-CM | POA: Diagnosis not present

## 2018-09-22 DIAGNOSIS — Z9149 Other personal history of psychological trauma, not elsewhere classified: Secondary | ICD-10-CM | POA: Diagnosis not present

## 2018-09-22 DIAGNOSIS — Z62891 Sibling rivalry: Secondary | ICD-10-CM | POA: Diagnosis not present

## 2018-09-25 ENCOUNTER — Encounter: Payer: Self-pay | Admitting: Family

## 2018-09-29 ENCOUNTER — Ambulatory Visit: Payer: Medicare Other

## 2018-10-04 DIAGNOSIS — G4733 Obstructive sleep apnea (adult) (pediatric): Secondary | ICD-10-CM | POA: Diagnosis not present

## 2018-10-07 ENCOUNTER — Telehealth: Payer: Self-pay | Admitting: *Deleted

## 2018-10-07 DIAGNOSIS — G4733 Obstructive sleep apnea (adult) (pediatric): Secondary | ICD-10-CM | POA: Diagnosis not present

## 2018-10-07 DIAGNOSIS — R400 Somnolence: Secondary | ICD-10-CM

## 2018-10-07 NOTE — Telephone Encounter (Signed)
[  10/07/2018 9:34 AM]  Parke Poisson:   Good morning ladies!  I was wondering if one of you would mind re-ordering the Home Sleep Test on Steven Payne [id number] (made future please) so that I can release the order?  Melissa ordered it on 10/7

## 2018-10-07 NOTE — Telephone Encounter (Signed)
Order re-entered as future per below request and skype sent to [facility] regarding completion status.

## 2018-10-08 ENCOUNTER — Other Ambulatory Visit: Payer: Self-pay | Admitting: *Deleted

## 2018-10-08 DIAGNOSIS — R400 Somnolence: Secondary | ICD-10-CM

## 2018-10-09 ENCOUNTER — Telehealth: Payer: Self-pay | Admitting: *Deleted

## 2018-10-09 NOTE — Telephone Encounter (Signed)
Received Home Sleep Diagnostic Test from Dr. Annamaria Boots at [facility]; forwarded to provider/SLS 11/07

## 2018-10-14 ENCOUNTER — Telehealth: Payer: Self-pay | Admitting: Family

## 2018-10-14 DIAGNOSIS — G4733 Obstructive sleep apnea (adult) (pediatric): Secondary | ICD-10-CM

## 2018-10-14 NOTE — Telephone Encounter (Signed)
Please notify pt that sleep study + for mild OSA. Recommend cpap titration study in sleep lab to determine best settings for home cpap.  Also recommend weight loss to help with osa.  He should be contacted about schedule titration study.

## 2018-10-14 NOTE — Telephone Encounter (Signed)
Notified pt and he voices understanding and is agreeable to proceed with titration study.

## 2018-10-20 DIAGNOSIS — F4310 Post-traumatic stress disorder, unspecified: Secondary | ICD-10-CM | POA: Diagnosis not present

## 2018-10-20 DIAGNOSIS — Z9149 Other personal history of psychological trauma, not elsewhere classified: Secondary | ICD-10-CM | POA: Diagnosis not present

## 2018-10-20 DIAGNOSIS — Z62891 Sibling rivalry: Secondary | ICD-10-CM | POA: Diagnosis not present

## 2018-10-30 ENCOUNTER — Other Ambulatory Visit: Payer: Self-pay | Admitting: Family

## 2018-11-10 DIAGNOSIS — Z62891 Sibling rivalry: Secondary | ICD-10-CM | POA: Diagnosis not present

## 2018-11-10 DIAGNOSIS — Z9149 Other personal history of psychological trauma, not elsewhere classified: Secondary | ICD-10-CM | POA: Diagnosis not present

## 2018-11-10 DIAGNOSIS — F4310 Post-traumatic stress disorder, unspecified: Secondary | ICD-10-CM | POA: Diagnosis not present

## 2018-11-12 ENCOUNTER — Other Ambulatory Visit: Payer: Self-pay | Admitting: Family

## 2018-11-14 ENCOUNTER — Telehealth: Payer: Self-pay | Admitting: Family

## 2018-11-14 MED ORDER — FAMOTIDINE 20 MG PO TABS: 20.0000 mg | ORAL_TABLET | Freq: Two times a day (BID) | ORAL | 1 refills | Status: DC

## 2018-11-14 NOTE — Telephone Encounter (Signed)
Patient presented to office requesting new rx to replace his zantac which was recalled.  Will rx with pepcid.

## 2018-11-20 DIAGNOSIS — Z125 Encounter for screening for malignant neoplasm of prostate: Secondary | ICD-10-CM | POA: Diagnosis not present

## 2018-11-20 DIAGNOSIS — E291 Testicular hypofunction: Secondary | ICD-10-CM | POA: Diagnosis not present

## 2018-11-21 DIAGNOSIS — D2372 Other benign neoplasm of skin of left lower limb, including hip: Secondary | ICD-10-CM | POA: Diagnosis not present

## 2018-11-21 DIAGNOSIS — B078 Other viral warts: Secondary | ICD-10-CM | POA: Diagnosis not present

## 2018-11-21 DIAGNOSIS — D485 Neoplasm of uncertain behavior of skin: Secondary | ICD-10-CM | POA: Diagnosis not present

## 2018-11-21 DIAGNOSIS — D224 Melanocytic nevi of scalp and neck: Secondary | ICD-10-CM | POA: Diagnosis not present

## 2018-11-29 ENCOUNTER — Other Ambulatory Visit: Payer: Self-pay | Admitting: Family

## 2018-11-30 IMAGING — MR MR LUMBAR SPINE W/O CM
4 of 5 series · 19 of 48 positions shown · non-contrast
Comparison: None.

CLINICAL DATA: Low back pain that radiates into the right anterior
thigh with numbness and soreness.

EXAM:
MRI LUMBAR SPINE WITHOUT CONTRAST
TECHNIQUE: Multiplanar, multisequence MR imaging of the lumbar spine was
performed. No intravenous contrast was administered.

[Series 3: T2 · sagittal · 4.0mm · 0.51mm/px · 6 of 14 slices shown (1 of 2)]
[im 1/14]
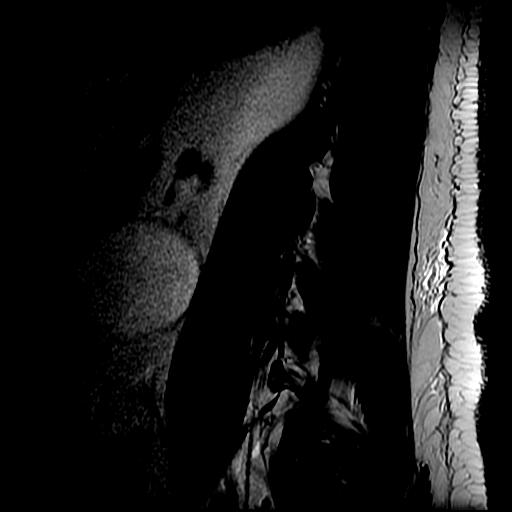
[im 3/14]
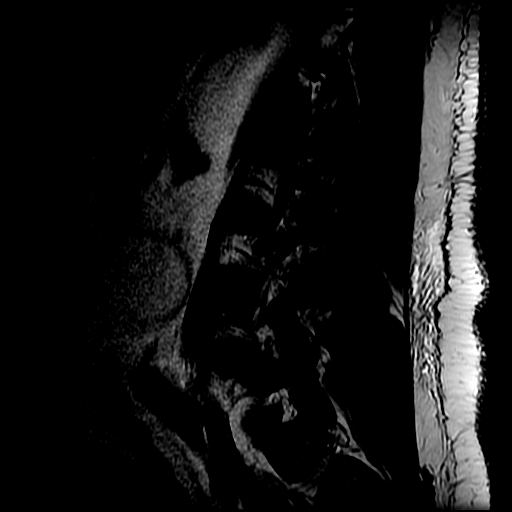
[im 6/14]
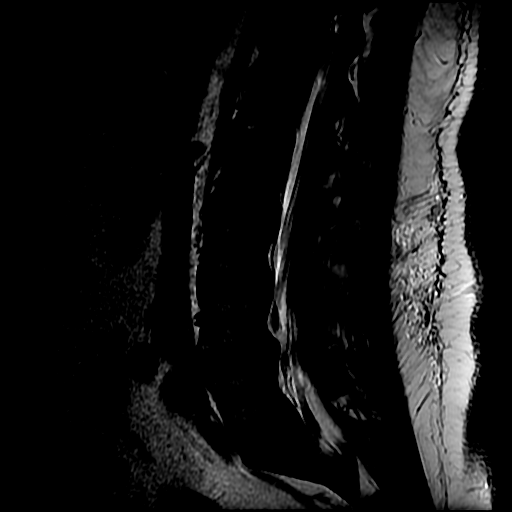
[im 8/14]
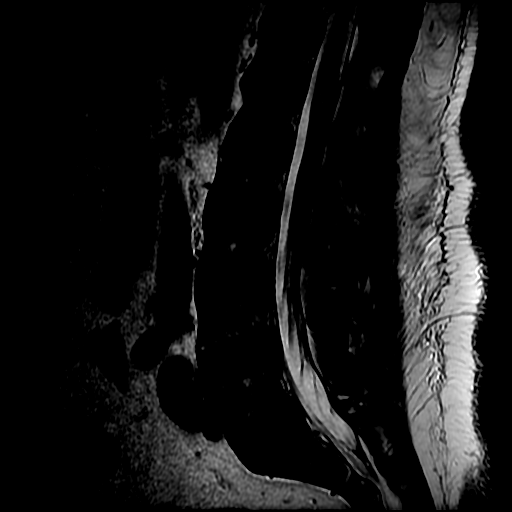
[im 11/14]
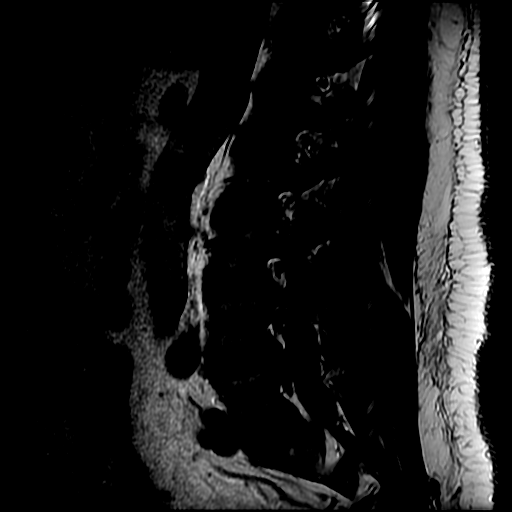
[im 14/14]
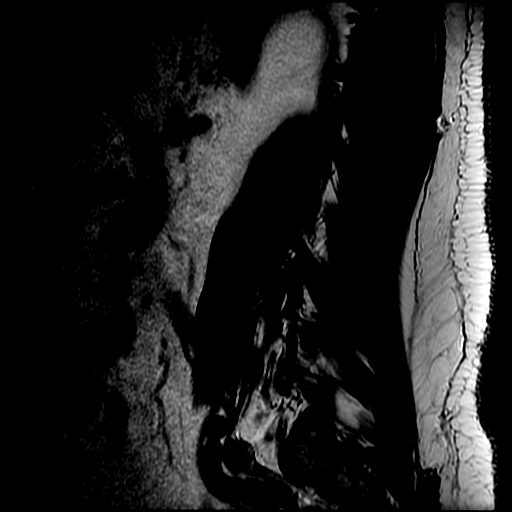

[Series 4: T1 · sagittal · 4.0mm · 0.51mm/px · 3 of 14 slices shown (1 of 2)]
[im 3/14]
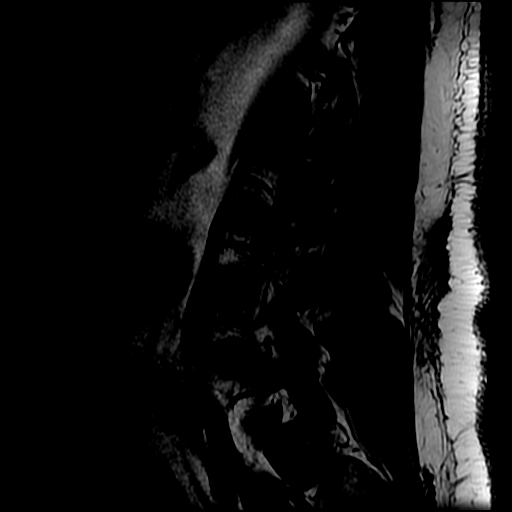
[im 8/14]
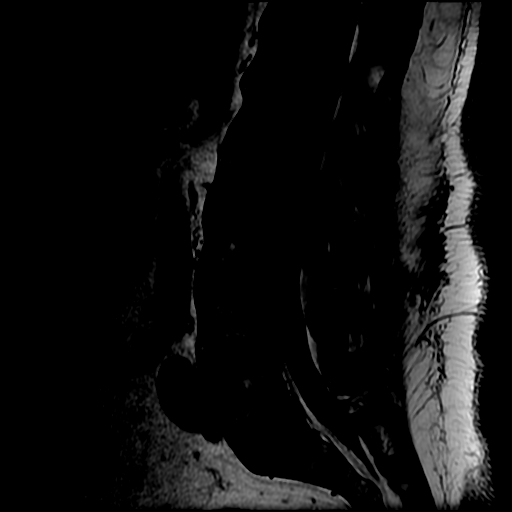
[im 14/14]
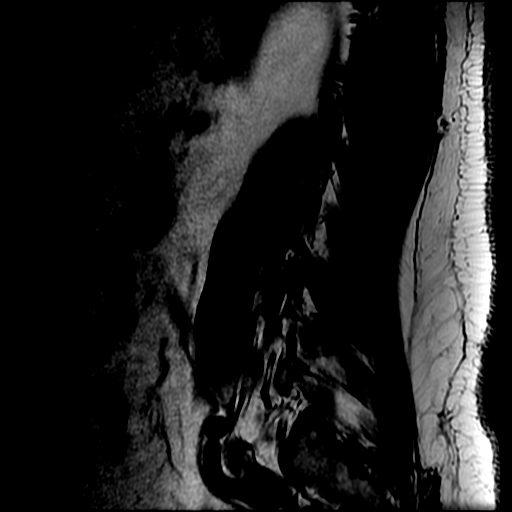

[Series 6: T2 · axial · 4.0mm · 0.39mm/px · z∈[-149,+32]mm · 7 of 37 slices shown (2 of 2)]
[im 1/37]
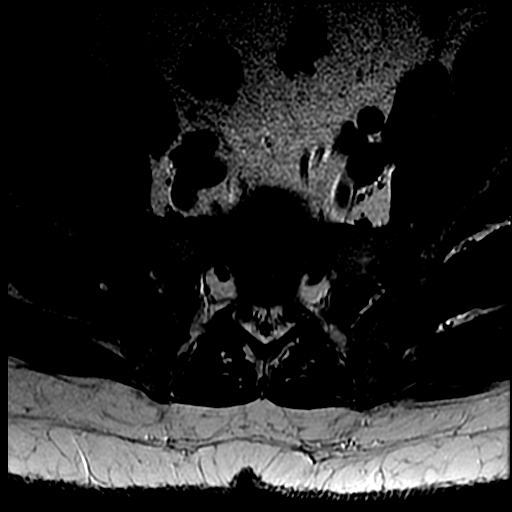
[im 6/37]
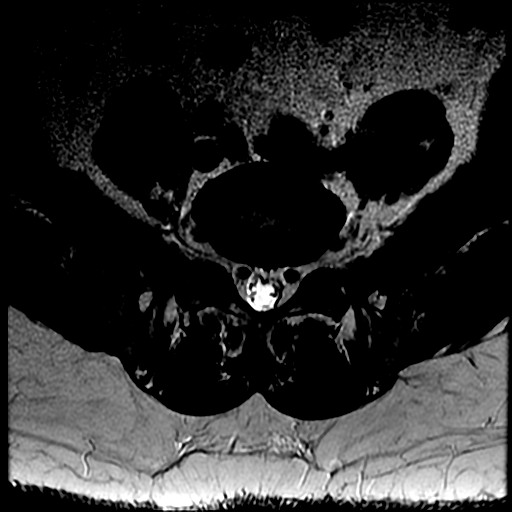
[im 11/37]
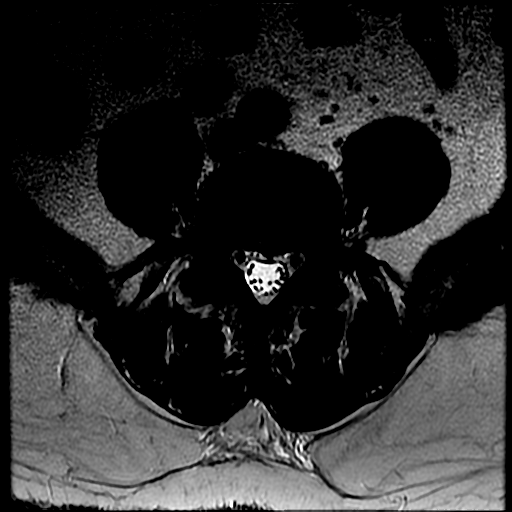
[im 16/37]
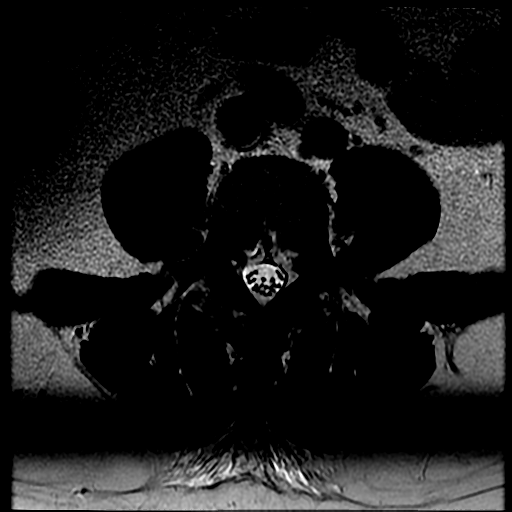
[im 19/37]
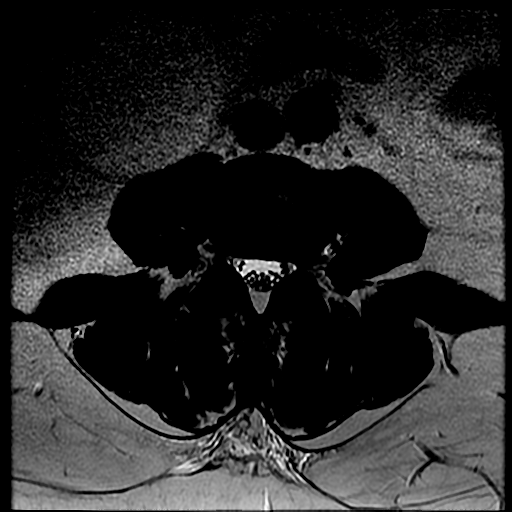
[im 21/37]
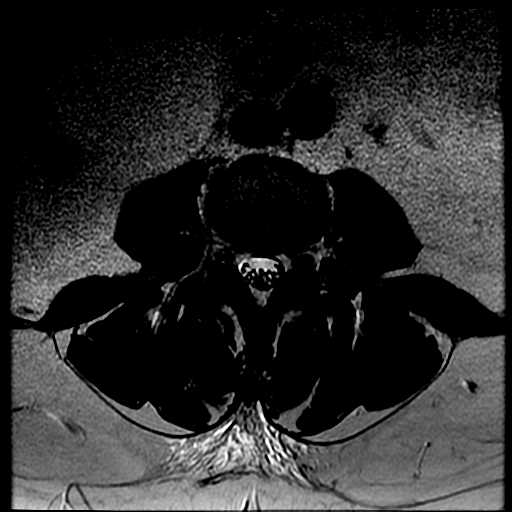
[im 31/37]
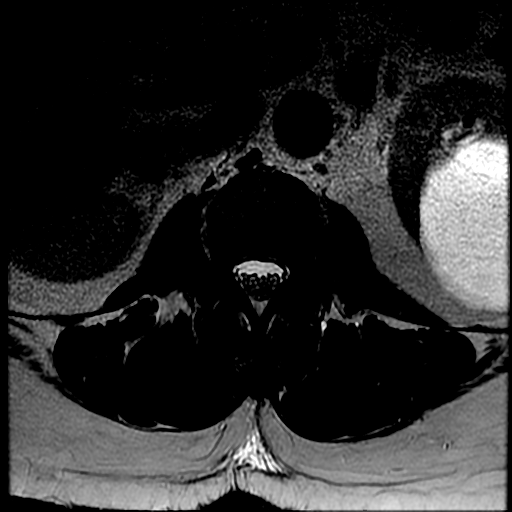

[Series 7: T1 · axial · 4.0mm · 0.39mm/px · z∈[-124,+32]mm · 3 of 37 slices shown (2 of 2)]
[im 6/37]
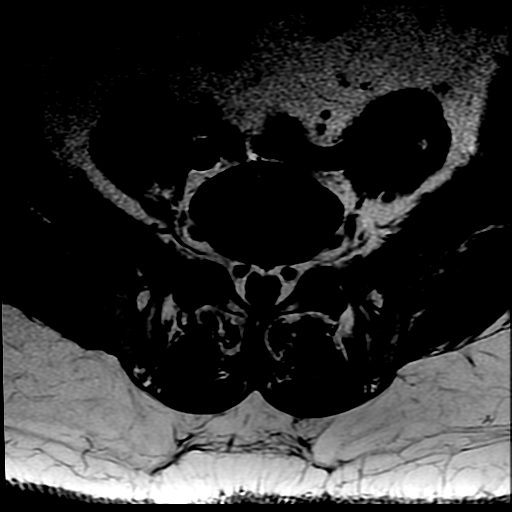
[im 19/37]
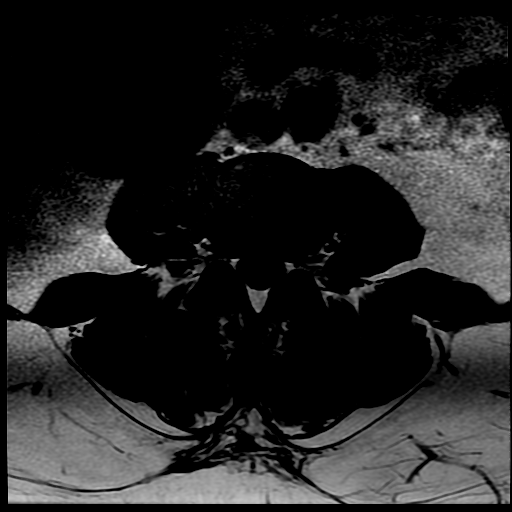
[im 31/37]
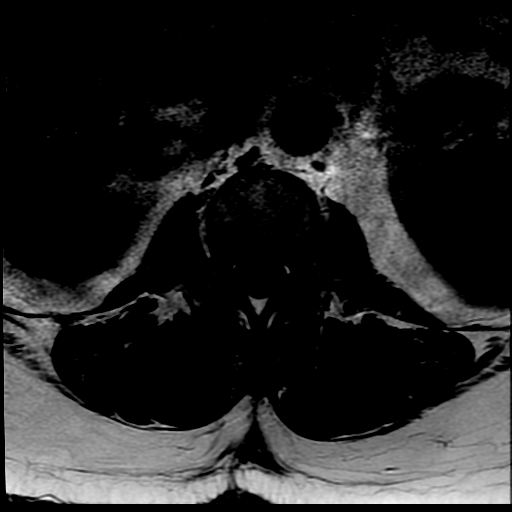

[19 of 48 positions shown; findings below may reference images not displayed]

FINDINGS: Segmentation:  Standard.

Alignment:  Physiologic.

Vertebrae:  No fracture, evidence of discitis, or bone lesion.

Conus medullaris: Extends to the L1 level and appears normal.

Paraspinal and other soft tissues: Large partly seen bilateral renal
cysts.

Disc levels:

T12- L1: Mild facet spurring on the left.  No impingement

L1-L2: Unremarkable.

L2-L3: Mild facet spurring.  No impingement.

L3-L4: Mild facet spurring.  No impingement

L4-L5: Mild facet spurring and trace joint fluid. Very mild disc
bulging. No impingement

L5-S1:Unremarkable.
IMPRESSION: Mild degenerative changes for age, mainly facet spurring. No
impingement to explain leg symptoms.

## 2018-12-22 ENCOUNTER — Ambulatory Visit (INDEPENDENT_AMBULATORY_CARE_PROVIDER_SITE_OTHER): Payer: Medicare Other | Admitting: Family

## 2018-12-22 ENCOUNTER — Encounter: Payer: Self-pay | Admitting: Family

## 2018-12-22 VITALS — BP 126/75 | HR 58 | Temp 98.2°F | Resp 16 | Ht 67.0 in | Wt 270.0 lb

## 2018-12-22 DIAGNOSIS — E119 Type 2 diabetes mellitus without complications: Secondary | ICD-10-CM

## 2018-12-22 DIAGNOSIS — K219 Gastro-esophageal reflux disease without esophagitis: Secondary | ICD-10-CM | POA: Diagnosis not present

## 2018-12-22 DIAGNOSIS — I2510 Atherosclerotic heart disease of native coronary artery without angina pectoris: Secondary | ICD-10-CM | POA: Diagnosis not present

## 2018-12-22 DIAGNOSIS — E785 Hyperlipidemia, unspecified: Secondary | ICD-10-CM | POA: Diagnosis not present

## 2018-12-22 DIAGNOSIS — I10 Essential (primary) hypertension: Secondary | ICD-10-CM

## 2018-12-22 DIAGNOSIS — G4733 Obstructive sleep apnea (adult) (pediatric): Secondary | ICD-10-CM

## 2018-12-22 LAB — HEMOGLOBIN A1C: Hgb A1c MFr Bld: 6.5 % (ref 4.6–6.5)

## 2018-12-22 LAB — BASIC METABOLIC PANEL WITH GFR
BUN: 7 mg/dL (ref 6–23)
CO2: 27 meq/L (ref 19–32)
Calcium: 8.8 mg/dL (ref 8.4–10.5)
Chloride: 104 meq/L (ref 96–112)
Creatinine, Ser: 0.88 mg/dL (ref 0.40–1.50)
GFR: 102.38 mL/min
Glucose, Bld: 106 mg/dL — ABNORMAL HIGH (ref 70–99)
Potassium: 3.9 meq/L (ref 3.5–5.1)
Sodium: 140 meq/L (ref 135–145)

## 2018-12-22 MED ORDER — POTASSIUM CHLORIDE CRYS ER 20 MEQ PO TBCR: 20.0000 meq | EXTENDED_RELEASE_TABLET | Freq: Every day | ORAL | 1 refills | Status: DC

## 2018-12-22 NOTE — Patient Instructions (Signed)
Please schedule follow up with Dr. Irish Lack. You should be scheduled about your referral for sleep study.

## 2018-12-22 NOTE — Progress Notes (Signed)
Subjective:    Patient ID: Steven Payne, male    DOB: 02/06/1946, 73 y.o.   MRN: [id number]  HPI  Patient is a 73 yr old male who presents today for routine follow up.  HTN- maintained on diltiazem, diovan-hct. He denies Swelling, CP or SOB. BP Readings from Last 3 Encounters:  12/22/18 126/75  09/08/18 127/72  06/02/18 (!) 146/71   DM2- Maintained on metformin. Reports that his home sugars are generally 100-110.   Lab Results  Component Value Date   HGBA1C 6.9 (H) 09/08/2018   HGBA1C 7.5 (H) 06/02/2018   HGBA1C 6.5 11/04/2017   Lab Results  Component Value Date   MICROALBUR 1.1 03/11/2017   LDLCALC 85 06/02/2018   CREATININE 0.81 09/08/2018   OSA- had home sleep study performed in November- noted mild osa.  Cpap titration study was ordered in November  GERD- we changed zantac to pepcid due to recall. Reports symptoms are well controlled.   CAD- maintained on plavix. Has followed with Dr. Irish Lack- last visit 07/22/17.   Hyperlipidemia- maintained on lipitor.  Lab Results  Component Value Date   CHOL 142 06/02/2018   HDL 33.00 (L) 06/02/2018   LDLCALC 85 06/02/2018   TRIG 121.0 06/02/2018   CHOLHDL 4 06/02/2018     Review of Systems See HPI  Past Medical History:  Diagnosis Date  . Arthritis   . Coronary artery disease cardiologist-  dr Irish Lack   per cardiac cath 06/ 2013--  no sig. cad, lvef 60%, sluggish ectatic coronary artery flow of lad, lcx, rc  . ED (erectile dysfunction)   . GERD (gastroesophageal reflux disease)   . History of gout    approx 2002  . Hyperlipemia   . Hypertension   . Hypogonadism male   . Left hydrocele   . Type 2 diabetes mellitus ([place])      Social History   Socioeconomic History  . Marital status: Married    Spouse name: Not on file  . Number of children: Not on file  . Years of education: Not on file  . Highest education level: Not on file  Occupational History  . Not on file  Social Needs  . Financial resource  strain: Not on file  . Food insecurity:    Worry: Not on file    Inability: Not on file  . Transportation needs:    Medical: Not on file    Non-medical: Not on file  Tobacco Use  . Smoking status: Former Smoker    Years: 1.00    Last attempt to quit: 03/27/1991    Years since quitting: 27.7  . Smokeless tobacco: Never Used  Substance and Sexual Activity  . Alcohol use: No    Alcohol/week: 0.0 standard drinks  . Drug use: No  . Sexual activity: Not on file  Lifestyle  . Physical activity:    Days per week: Not on file    Minutes per session: Not on file  . Stress: Not on file  Relationships  . Social connections:    Talks on phone: Not on file    Gets together: Not on file    Attends religious service: Not on file    Active member of club or organization: Not on file    Attends meetings of clubs or organizations: Not on file    Relationship status: Not on file  . Intimate partner violence:    Fear of current or ex partner: Not on file    Emotionally  abused: Not on file    Physically abused: Not on file    Forced sexual activity: Not on file  Other Topics Concern  . 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  No grandchildren.  Oldest in [place], youngest in [place]   Completed bachelors, some graduate (Estate manager/land agent)   Enjoys travelling, walking, cruises    Past Surgical History:  Procedure Laterality Date  . APPENDECTOMY  age 46's  . CARDIAC CATHETERIZATION  05-08-2012  dr Elonda Husky at [facility]   no significant cad, sluggish ectatic coronary artery flow (rca, lad, lcx), normal LVSF, ef 60%  . HYDROCELE EXCISION Left 09/10/2016   Procedure: HYDROCELECTOMY ADULT;  Surgeon: Kathie Rhodes, MD;  Location: [facility];  Service: Urology;  Laterality: Left;  . UMBILICAL HERNIA REPAIR  1980's    Family History  Problem  Relation Age of Onset  . Coronary artery disease Father   . Diabetes Father   . Prostate cancer Father        died at 28  . Alcohol abuse Father   . Coronary artery disease Mother        died age 44  . Alcohol abuse Mother   . Cancer Mother        unsure, + thyroid cancer  . Breast cancer Paternal Grandmother     Allergies  Allergen Reactions  . Tanzeum [Albiglutide] Other (See Comments)    constipation    Current Outpatient Medications on File Prior to Visit  Medication Sig Dispense Refill  . atorvastatin (LIPITOR) 40 MG tablet TAKE 1 TABLET DAILY 90 tablet 4  . clopidogrel (PLAVIX) 75 MG tablet Take 1 tablet (75 mg total) by mouth daily. 90 tablet 1  . diltiazem (TIAZAC) 300 MG 24 hr capsule TAKE 1 CAPSULE EVERY MORNING 90 capsule 4  . famotidine (PEPCID) 20 MG tablet Take 1 tablet (20 mg total) by mouth 2 (two) times daily. 180 tablet 1  . metFORMIN (GLUCOPHAGE) 1000 MG tablet TAKE 1 TABLET TWICE A DAY WITH MEALS ( THIS SHOULD REPLACE 500 MG TWICE A DAY ) 180 tablet 4  . potassium chloride SA (K-DUR,KLOR-CON) 20 MEQ tablet Take 1 tablet (20 mEq total) by mouth daily. 90 tablet 1  . ranitidine (ZANTAC) 150 MG tablet TAKE 1 TABLET TWICE A DAY 180 tablet 4  . valsartan-hydrochlorothiazide (DIOVAN-HCT) 160-25 MG tablet TAKE 1 TABLET EVERY EVENING 90 tablet 4   No current facility-administered medications on file prior to visit.     BP 126/75 (BP Location: Right Arm, Patient Position: Sitting, Cuff Size: Large)   Pulse (!) 58   Temp 98.2 F (36.8 C) (Oral)   Resp 16   Ht 5\' 7"  (1.702 m)   Wt 270 lb (122.5 kg)   SpO2 99%   BMI 42.29 kg/m       Objective:   Physical Exam Constitutional:      General: He is not in acute distress.    Appearance: He is well-developed.  HENT:     Head: Normocephalic and atraumatic.  Cardiovascular:     Rate and Rhythm: Normal rate and regular rhythm.     Heart sounds: No murmur.  Pulmonary:     Effort: Pulmonary effort is normal. No  respiratory distress.     Breath sounds: Normal breath sounds. No wheezing or rales.  Skin:    General: Skin is warm and dry.  Neurological:     Mental Status: He is alert and oriented to person, place, and time.  Psychiatric:        Behavior: Behavior normal.        Thought Content: Thought content normal.           Assessment & Plan:  DM2- clinically stable, obtain follow up A1C.  HTN- stable on current meds.  Continue same.  OSA- did not schedule cpap titration but he is agreeable to do so. Replaced order.  Hyperlipidemia- LDL at goal.  Continue statin.  CAD- clinically stable. Maintained on plavix- he is past due for follow up with cardiology and I have asked him to schedule follow up with Dr. Irish Lack.  GERD-stable with pepcid prn.

## 2019-01-16 ENCOUNTER — Other Ambulatory Visit: Payer: Self-pay | Admitting: Family

## 2019-01-19 DIAGNOSIS — E291 Testicular hypofunction: Secondary | ICD-10-CM | POA: Diagnosis not present

## 2019-03-24 ENCOUNTER — Ambulatory Visit: Payer: Medicare Other | Admitting: Family

## 2019-04-21 ENCOUNTER — Ambulatory Visit (INDEPENDENT_AMBULATORY_CARE_PROVIDER_SITE_OTHER): Payer: Medicare Other | Admitting: Family

## 2019-04-21 ENCOUNTER — Other Ambulatory Visit: Payer: Self-pay

## 2019-04-21 DIAGNOSIS — I10 Essential (primary) hypertension: Secondary | ICD-10-CM

## 2019-04-21 DIAGNOSIS — E782 Mixed hyperlipidemia: Secondary | ICD-10-CM | POA: Diagnosis not present

## 2019-04-21 DIAGNOSIS — I2510 Atherosclerotic heart disease of native coronary artery without angina pectoris: Secondary | ICD-10-CM

## 2019-04-21 DIAGNOSIS — K219 Gastro-esophageal reflux disease without esophagitis: Secondary | ICD-10-CM | POA: Diagnosis not present

## 2019-04-21 DIAGNOSIS — E119 Type 2 diabetes mellitus without complications: Secondary | ICD-10-CM

## 2019-04-21 NOTE — Progress Notes (Signed)
Virtual Visit via Telephone Note  I connected with Steven Payne on 04/21/19 at  8:20 AM EDT by telephone and verified that I am speaking with the correct person using two identifiers. This method was chosen due to social distancing recommendations in the setting of COVID-19 pandemic. Also, he does not have access to internet.   Location: Patient: home Provider: office   I discussed the limitations, risks, security and privacy concerns of performing an evaluation and management service by telephone and the availability of in person appointments. I also discussed with the patient that there may be a patient responsible charge related to this service. The patient expressed understanding and agreed to proceed.   History of Present Illness:  DM2- reports sugars running 109-118.  Pt is maintained on metformin.   Lab Results  Component Value Date   HGBA1C 6.5 12/22/2018   HGBA1C 6.9 (H) 09/08/2018   HGBA1C 7.5 (H) 06/02/2018   Lab Results  Component Value Date   MICROALBUR 1.1 03/11/2017   LDLCALC 85 06/02/2018   CREATININE 0.88 12/22/2018   HTN-  Reports bp 128/79 2 days ago.  120/70, runs in that range. He is maintained on diovan- hct    GERD- uses pepcid prn.  Reports mild gerd symptoms but overall ok. Attributes to diet.   OSA- reports that sleep study was postponed due to covid.    Observations/Objective:   Gen: Awake, alert, no acute distress Resp: Breathing sounds even and non-labored Psych: calm/pleasant demeanor Neuro: Alert and Oriented x 3, speech is clear.   Assessment and Plan:  1) HTN- home bp readings are stable. Continue current meds.  2) CAD- He is past due for follow up with cardiology.  Will arrange follow up consult. He is requesting to transfer his cardiology to the [facility] location. Continue plavix.  3) GERD- stable on pepcid.  4) Hyperlipidemia- LDL at goal. Continue statin.  Lab Results  Component Value Date   CHOL 142 06/02/2018   HDL 33.00 (L)  06/02/2018   LDLCALC 85 06/02/2018   TRIG 121.0 06/02/2018   CHOLHDL 4 06/02/2018   5) DM2- stable. Continue metformin.   Follow Up Instructions:    I discussed the assessment and treatment plan with the patient. The patient was provided an opportunity to ask questions and all were answered. The patient agreed with the plan and demonstrated an understanding of the instructions.   The patient was advised to call back or seek an in-person evaluation if the symptoms worsen or if the condition fails to improve as anticipated.  I provided 18 minutes of non-face-to-face time during this encounter.   Nance Pear, NP

## 2019-04-22 ENCOUNTER — Ambulatory Visit: Payer: Medicare Other | Admitting: Family

## 2019-04-30 ENCOUNTER — Telehealth: Payer: Self-pay | Admitting: Cardiology

## 2019-04-30 NOTE — Telephone Encounter (Signed)

## 2019-05-01 ENCOUNTER — Telehealth (INDEPENDENT_AMBULATORY_CARE_PROVIDER_SITE_OTHER): Payer: Medicare Other | Admitting: Cardiology

## 2019-05-01 ENCOUNTER — Encounter: Payer: Self-pay | Admitting: Cardiology

## 2019-05-01 ENCOUNTER — Other Ambulatory Visit: Payer: Self-pay

## 2019-05-01 VITALS — BP 135/83 | HR 62 | Ht 67.0 in | Wt 270.0 lb

## 2019-05-01 DIAGNOSIS — I2510 Atherosclerotic heart disease of native coronary artery without angina pectoris: Secondary | ICD-10-CM

## 2019-05-01 DIAGNOSIS — E782 Mixed hyperlipidemia: Secondary | ICD-10-CM

## 2019-05-01 DIAGNOSIS — E119 Type 2 diabetes mellitus without complications: Secondary | ICD-10-CM

## 2019-05-01 DIAGNOSIS — I7789 Other specified disorders of arteries and arterioles: Secondary | ICD-10-CM

## 2019-05-01 DIAGNOSIS — I10 Essential (primary) hypertension: Secondary | ICD-10-CM

## 2019-05-01 DIAGNOSIS — E088 Diabetes mellitus due to underlying condition with unspecified complications: Secondary | ICD-10-CM

## 2019-05-01 HISTORY — DX: Diabetes mellitus due to underlying condition with unspecified complications: E08.8

## 2019-05-01 HISTORY — DX: Other specified disorders of arteries and arterioles: I77.89

## 2019-05-01 NOTE — Patient Instructions (Addendum)
Medication Instructions:  Your physician recommends that you continue on your current medications as directed. Please refer to the Current Medication list given to you today.  If you need a refill on your cardiac medications before your next appointment, please call your pharmacy.   Lab work: Your physician recommends that you return for lab work in: 1-2 week LIPID, LIVER  If you have labs (blood work) drawn today and your tests are completely normal, you will receive your results only by: Marland Kitchen MyChart Message (if you have MyChart) OR . A paper copy in the mail If you have any lab test that is abnormal or we need to change your treatment, we will call you to review the results.  Testing/Procedures: Your physician has requested that you have an echocardiogram. Echocardiography is a painless test that uses sound waves to create images of your heart. It provides your doctor with information about the size and shape of your heart and how well your heart's chambers and valves are working. This procedure takes approximately one hour. There are no restrictions for this procedure.  YOUR APPOINTMENT IS June 9,2020 at 9:15 at [facility], you and your health needs are our priority.  As part of our continuing mission to provide you with exceptional heart care, we have created designated Provider Care Teams.  These Care Teams include your primary Cardiologist (physician) and Advanced Practice Providers (APPs -  Physician Assistants and Nurse Practitioners) who all work together to provide you with the care you need, when you need it. You will need a follow up appointment in 3 months.  Sept 1,2020 at 9:00AM at the [facility] office Any Other Special Instructions Will Be Listed Below (If Applicable).

## 2019-05-01 NOTE — Progress Notes (Signed)
Virtual Visit via Telephone Note   This visit type was conducted due to national recommendations for restrictions regarding the COVID-19 Pandemic (e.g. social distancing) in an effort to limit this patient's exposure and mitigate transmission in our community.  Due to his co-morbid illnesses, this patient is at least at moderate risk for complications without adequate follow up.  This format is felt to be most appropriate for this patient at this time.  The patient did not have access to video technology/had technical difficulties with video requiring transitioning to audio format only (telephone).  All issues noted in this document were discussed and addressed.  No physical exam could be performed with this format.  Please refer to the patient's chart for his  consent to telehealth for [facility].   Date:  05/01/2019   ID:  Steven Payne, DOB 12-Jan-1946, MRN [id number]  Patient Location: Home Provider Location: Office   PCP:  [name], NP  Cardiologist:  No primary care provider on file.  Electrophysiologist:  None   Evaluation Performed:  New Patient Evaluation  Chief Complaint: Follow-up for coronary artery disease and coronary ectasia  History of Present Illness:    Steven Payne is a 73 y.o. male with past medical history of essential hypertension, diabetes mellitus and dyslipidemia and morbid obesity.  He underwent coronary angiography in 2013 and I reviewed it in detail and revealed only coronary ectasia.  He denies any chest pain orthopnea or PND.  He walks 2 miles a day about 2 or 3 times a week.  At the time of my evaluation, the patient is alert awake oriented and in no distress.  The patient does not have symptoms concerning for COVID-19 infection (fever, chills, cough, or new shortness of breath).    Past Medical History:  Diagnosis Date  . Arthritis   . Coronary artery disease cardiologist-  dr Irish Lack   per cardiac cath 06/ 2013--  no sig. cad, lvef  60%, sluggish ectatic coronary artery flow of lad, lcx, rc  . ED (erectile dysfunction)   . GERD (gastroesophageal reflux disease)   . History of gout    approx 2002  . Hyperlipemia   . Hypertension   . Hypogonadism male   . Left hydrocele   . Type 2 diabetes mellitus ([place])    Past Surgical History:  Procedure Laterality Date  . APPENDECTOMY  age 36's  . CARDIAC CATHETERIZATION  05-08-2012  dr Elonda Husky at [facility]   no significant cad, sluggish ectatic coronary artery flow (rca, lad, lcx), normal LVSF, ef 60%  . HYDROCELE EXCISION Left 09/10/2016   Procedure: HYDROCELECTOMY ADULT;  Surgeon: Kathie Rhodes, MD;  Location: [facility];  Service: Urology;  Laterality: Left;  . UMBILICAL HERNIA REPAIR  1980's     Current Meds  Medication Sig  . atorvastatin (LIPITOR) 40 MG tablet TAKE 1 TABLET DAILY  . clopidogrel (PLAVIX) 75 MG tablet TAKE 1 TABLET DAILY  . diltiazem (TIAZAC) 300 MG 24 hr capsule TAKE 1 CAPSULE EVERY MORNING (Patient taking differently: Take 300 mg by mouth daily. )  . famotidine (PEPCID) 20 MG tablet Take 1 tablet (20 mg total) by mouth 2 (two) times daily. (Patient taking differently: Take 20 mg by mouth daily. )  . metFORMIN (GLUCOPHAGE) 1000 MG tablet TAKE 1 TABLET TWICE A DAY WITH MEALS ( THIS SHOULD REPLACE 500 MG TWICE A DAY )  . potassium chloride SA (K-DUR,KLOR-CON) 20 MEQ tablet Take 1 tablet (20 mEq total) by mouth  daily. (Patient taking differently: Take 20 mEq by mouth 3 (three) times a week. )  . Testosterone 1.62 % GEL Place onto the skin.  . valsartan-hydrochlorothiazide (DIOVAN-HCT) 160-25 MG tablet TAKE 1 TABLET EVERY EVENING     Allergies:   Tanzeum [albiglutide]   Social History   Tobacco Use  . Smoking status: Former Smoker    Years: 1.00    Last attempt to quit: 03/27/1991    Years since quitting: 28.1  . Smokeless tobacco: Never Used  Substance Use Topics  . Alcohol use: No    Alcohol/week: 0.0 standard drinks  . Drug use: No      Family Hx: The patient's family history includes Alcohol abuse in his father and mother; Breast cancer in his paternal grandmother; Cancer in his mother; Coronary artery disease in his father and mother; Diabetes in his father; Prostate cancer in his father.  ROS:   Please see the history of present illness.    As mentioned above All other systems reviewed and are negative.   Prior CV studies:   The following studies were reviewed today:  Records from primary care doctor's office and blood work was reviewed at length  Labs/Other Tests and Data Reviewed:    EKG:  EKG from 2018 reveals sinus rhythm, left axis deviation and nonspecific ST-T changes.  Recent Labs: 06/02/2018: ALT 14 12/22/2018: BUN 7; Creatinine, Ser 0.88; Potassium 3.9; Sodium 140   Recent Lipid Panel Lab Results  Component Value Date/Time   CHOL 142 06/02/2018 08:31 AM   TRIG 121.0 06/02/2018 08:31 AM   HDL 33.00 (L) 06/02/2018 08:31 AM   CHOLHDL 4 06/02/2018 08:31 AM   LDLCALC 85 06/02/2018 08:31 AM    Wt Readings from Last 3 Encounters:  05/01/19 270 lb (122.5 kg)  12/22/18 270 lb (122.5 kg)  09/08/18 279 lb (126.6 kg)     Objective:    Vital Signs:  BP 135/83 (BP Location: Left Arm, Patient Position: Sitting, Cuff Size: Normal)   Pulse 62   Ht 5\' 7"  (1.702 m)   Wt 270 lb (122.5 kg)   BMI 42.29 kg/m    VITAL SIGNS:  reviewed  ASSESSMENT & PLAN:    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  Primary prevention stressed.  Importance of compliance with diet and medication stressed and he vocalized understanding.  I advised him to increase his exercise to 30 minutes a day at least 5 days a week. 2. Essential hypertension: Blood pressure stable.  Diet was discussed 3. Mixed dyslipidemia: Lipids were reviewed and we will get him back for a liver lipid check in the next couple of weeks.  He will also have an echocardiogram to assess for essential hypertension related endorgan damage such as LVH  and left ventricular systolic function.  We can do this test on the same day. 4. Diabetes mellitus: Managed by his primary care physician 5. Morbid obesity: Diet was discussed risks of obesity explained and he vocalized understanding. 6. Patient will be seen in follow-up appointment in 3 months or earlier if the patient has any concerns   COVID-19 Education: The signs and symptoms of COVID-19 were discussed with the patient and how to seek care for testing (follow up with PCP or arrange E-visit).  The importance of social distancing was discussed today.  Time:   Today, I have spent 22 minutes with the patient with telehealth technology discussing the above problems.     Medication Adjustments/Labs and Tests Ordered: Current medicines  are reviewed at length with the patient today.  Concerns regarding medicines are outlined above.   Tests Ordered: No orders of the defined types were placed in this encounter.   Medication Changes: No orders of the defined types were placed in this encounter.   Disposition:  Follow up in 3 month(s)  Signed, Jenean Lindau, MD  05/01/2019 11:40 AM    [place]

## 2019-05-01 NOTE — Addendum Note (Signed)
Addended by: Particia Nearing B on: 05/01/2019 12:15 PM   Modules accepted: Orders

## 2019-05-12 ENCOUNTER — Other Ambulatory Visit: Payer: Self-pay

## 2019-05-12 ENCOUNTER — Ambulatory Visit (HOSPITAL_BASED_OUTPATIENT_CLINIC_OR_DEPARTMENT_OTHER)
Admission: RE | Admit: 2019-05-12 | Discharge: 2019-05-12 | Disposition: A | Discharge status: Home / Self Care | Payer: Medicare Other | Source: Ambulatory Visit | Attending: Cardiology | Admitting: Cardiology

## 2019-05-12 DIAGNOSIS — E782 Mixed hyperlipidemia: Secondary | ICD-10-CM | POA: Diagnosis not present

## 2019-05-12 DIAGNOSIS — I7789 Other specified disorders of arteries and arterioles: Secondary | ICD-10-CM | POA: Diagnosis not present

## 2019-05-12 DIAGNOSIS — I10 Essential (primary) hypertension: Secondary | ICD-10-CM | POA: Insufficient documentation

## 2019-05-12 NOTE — Progress Notes (Signed)
  Echocardiogram 2D Echocardiogram has been performed.  Steven Payne 05/12/2019, 1:32 PM

## 2019-05-13 ENCOUNTER — Telehealth: Payer: Self-pay

## 2019-05-13 LAB — HEPATIC FUNCTION PANEL
ALT: 12 IU/L (ref 0–44)
AST: 23 IU/L (ref 0–40)
Albumin: 4.5 g/dL (ref 3.7–4.7)
Alkaline Phosphatase: 93 IU/L (ref 39–117)
Bilirubin Total: 0.6 mg/dL (ref 0.0–1.2)
Bilirubin, Direct: 0.16 mg/dL (ref 0.00–0.40)
Total Protein: 6.9 g/dL (ref 6.0–8.5)

## 2019-05-13 LAB — LIPID PANEL
Chol/HDL Ratio: 3.8 ratio (ref 0.0–5.0)
Cholesterol, Total: 145 mg/dL (ref 100–199)
HDL: 38 mg/dL — ABNORMAL LOW (ref >39)
LDL Calculated: 81 mg/dL (ref 0–99)
Triglycerides: 128 mg/dL (ref 0–149)
VLDL Cholesterol Cal: 26 mg/dL (ref 5–40)

## 2019-05-13 NOTE — Telephone Encounter (Signed)
Called and left detailed voice message on patients phone regarding lab results.

## 2019-05-13 NOTE — Telephone Encounter (Signed)
-----   Message from Jenean Lindau, MD sent at 05/13/2019  8:20 AM EDT ----- The results of the study is unremarkable. Please inform patient. I will discuss in detail at next appointment. Cc  primary care/referring physician Jenean Lindau, MD 05/13/2019 8:20 AM

## 2019-07-15 ENCOUNTER — Other Ambulatory Visit: Payer: Self-pay | Admitting: Family

## 2019-07-27 ENCOUNTER — Other Ambulatory Visit: Payer: Self-pay

## 2019-07-27 ENCOUNTER — Ambulatory Visit (INDEPENDENT_AMBULATORY_CARE_PROVIDER_SITE_OTHER): Payer: Medicare Other | Admitting: Family

## 2019-07-27 ENCOUNTER — Telehealth: Payer: Self-pay | Admitting: Family

## 2019-07-27 VITALS — BP 128/85 | Wt 265.0 lb

## 2019-07-27 DIAGNOSIS — K219 Gastro-esophageal reflux disease without esophagitis: Secondary | ICD-10-CM

## 2019-07-27 DIAGNOSIS — I25119 Atherosclerotic heart disease of native coronary artery with unspecified angina pectoris: Secondary | ICD-10-CM | POA: Diagnosis not present

## 2019-07-27 DIAGNOSIS — E119 Type 2 diabetes mellitus without complications: Secondary | ICD-10-CM | POA: Diagnosis not present

## 2019-07-27 DIAGNOSIS — G4733 Obstructive sleep apnea (adult) (pediatric): Secondary | ICD-10-CM

## 2019-07-27 DIAGNOSIS — E782 Mixed hyperlipidemia: Secondary | ICD-10-CM | POA: Diagnosis not present

## 2019-07-27 DIAGNOSIS — I10 Essential (primary) hypertension: Secondary | ICD-10-CM | POA: Diagnosis not present

## 2019-07-27 NOTE — Progress Notes (Signed)
Virtual Visit via Video Note  I connected with Steven Payne on 07/27/19 at  7:00 AM EDT by a video enabled telemedicine application and verified that I am speaking with the correct person using two identifiers.  Location: Patient: home Provider: home   I discussed the limitations of evaluation and management by telemedicine and the availability of in person appointments. The patient expressed understanding and agreed to proceed.  History of Present Illness:  Patient is a 73 yr old male who presents today for follow.  DM2- this is diet controlled.   Lab Results  Component Value Date   HGBA1C 6.5 12/22/2018   HGBA1C 6.9 (H) 09/08/2018   HGBA1C 7.5 (H) 06/02/2018   Lab Results  Component Value Date   MICROALBUR 1.1 03/11/2017   LDLCALC 81 05/12/2019   CREATININE 0.88 12/22/2018   CAD-  Clinically stable, maintained on plavix.  Has follow up with cardiology next month.    HTN- maintained on diovan hct.  Reports blood pressure has been stable.    BP Readings from Last 3 Encounters:  07/27/19 128/85  05/01/19 135/83  12/22/18 126/75   gerd-reports symptoms are stable with dietary discretion.    OSA- had sleep study back in 11/19 which showed mild OSA.    Hyperlipidemia- denies myalgia on lipitor Lab Results  Component Value Date   CHOL 145 05/12/2019   HDL 38 (L) 05/12/2019   LDLCALC 81 05/12/2019   TRIG 128 05/12/2019   CHOLHDL 3.8 05/12/2019   Reports weight 265 Wt Readings from Last 3 Encounters:  07/27/19 265 lb (120.2 kg)  05/01/19 270 lb (122.5 kg)  12/22/18 270 lb (122.5 kg)   Past Medical History:  Diagnosis Date  . Arthritis   . Coronary artery disease cardiologist-  dr Irish Lack   per cardiac cath 06/ 2013--  no sig. cad, lvef 60%, sluggish ectatic coronary artery flow of lad, lcx, rc  . ED (erectile dysfunction)   . GERD (gastroesophageal reflux disease)   . History of gout    approx 2002  . Hyperlipemia   . Hypertension   . Hypogonadism male    . Left hydrocele   . Type 2 diabetes mellitus ([place])      Social History   Socioeconomic History  . Marital status: Married    Spouse name: Not on file  . Number of children: Not on file  . Years of education: Not on file  . Highest education level: Not on file  Occupational History  . Not on file  Social Needs  . Financial resource strain: Not on file  . Food insecurity    Worry: Not on file    Inability: Not on file  . Transportation needs    Medical: Not on file    Non-medical: Not on file  Tobacco Use  . Smoking status: Former Smoker    Years: 1.00    Quit date: 03/27/1991    Years since quitting: 28.3  . Smokeless tobacco: Never Used  Substance and Sexual Activity  . Alcohol use: No    Alcohol/week: 0.0 standard drinks  . Drug use: No  . Sexual activity: Not on file  Lifestyle  . Physical activity    Days per week: Not on file    Minutes per session: Not on file  . Stress: Not on file  Relationships  . Social Herbalist on phone: Not on file    Gets together: Not on file    Attends religious  service: Not on file    Active member of club or organization: Not on file    Attends meetings of clubs or organizations: Not on file    Relationship status: Not on file  . Intimate partner violence    Fear of current or ex partner: Not on file    Emotionally abused: Not on file    Physically abused: Not on file    Forced sexual activity: Not on file  Other Topics Concern  . 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  No grandchildren.  Oldest in [place], youngest in [place]   Completed bachelors, some graduate (Estate manager/land agent)   Enjoys travelling, walking, cruises    Past Surgical History:  Procedure Laterality Date  . APPENDECTOMY  age 72's  . CARDIAC CATHETERIZATION  05-08-2012  dr Elonda Husky at [facility]   no  significant cad, sluggish ectatic coronary artery flow (rca, lad, lcx), normal LVSF, ef 60%  . HYDROCELE EXCISION Left 09/10/2016   Procedure: HYDROCELECTOMY ADULT;  Surgeon: Kathie Rhodes, MD;  Location: [facility];  Service: Urology;  Laterality: Left;  . UMBILICAL HERNIA REPAIR  1980's    Family History  Problem Relation Age of Onset  . Coronary artery disease Father   . Diabetes Father   . Prostate cancer Father        died at 72  . Alcohol abuse Father   . Coronary artery disease Mother        died age 73  . Alcohol abuse Mother   . Cancer Mother        unsure, + thyroid cancer  . Breast cancer Paternal Grandmother     Allergies  Allergen Reactions  . Tanzeum [Albiglutide] Other (See Comments)    constipation    Current Outpatient Medications on File Prior to Visit  Medication Sig Dispense Refill  . atorvastatin (LIPITOR) 40 MG tablet TAKE 1 TABLET DAILY 90 tablet 4  . clopidogrel (PLAVIX) 75 MG tablet TAKE 1 TABLET DAILY 90 tablet 3  . diltiazem (TIAZAC) 300 MG 24 hr capsule TAKE 1 CAPSULE EVERY MORNING (Patient taking differently: Take 300 mg by mouth daily. ) 90 capsule 4  . famotidine (PEPCID) 20 MG tablet Take 1 tablet (20 mg total) by mouth 2 (two) times daily. (Patient taking differently: Take 20 mg by mouth daily. ) 180 tablet 1  . metFORMIN (GLUCOPHAGE) 1000 MG tablet TAKE 1 TABLET TWICE A DAY WITH MEALS ( THIS SHOULD REPLACE 500 MG TWICE A DAY ) 180 tablet 4  . potassium chloride SA (K-DUR,KLOR-CON) 20 MEQ tablet Take 1 tablet (20 mEq total) by mouth daily. (Patient taking differently: Take 20 mEq by mouth 3 (three) times a week. ) 90 tablet 1  . Testosterone 1.62 % GEL Place onto the skin.    . valsartan-hydrochlorothiazide (DIOVAN-HCT) 160-25 MG tablet TAKE 1 TABLET EVERY EVENING 90 tablet 4   No current facility-administered medications on file prior to visit.     BP 128/85   Wt 265 lb (120.2 kg)   BMI 41.50 kg/m       Observations/Objective:   Gen: Awake, alert, no acute distress Resp: Breathing is even and non-labored Psych: calm/pleasant demeanor Neuro: Alert and Oriented x 3, + facial symmetry, speech is clear.   Assessment and Plan:  HTN- bp stable on current  medication, continue same.  Hyperlipidemia- LDL at goal, continue statin. Encouraged continued weight loss efforts.  CAD- clinically stable, maintained on plavix, management per cardiology.  DM2- obtain follow up a1C.  GERD- stable with dietary modification.  OSA- will refer to pulmonology for further evaluation and possible cpap initiation.   Follow Up Instructions:    I discussed the assessment and treatment plan with the patient. The patient was provided an opportunity to ask questions and all were answered. The patient agreed with the plan and demonstrated an understanding of the instructions.   The patient was advised to call back or seek an in-person evaluation if the symptoms worsen or if the condition fails to improve as anticipated.  Nance Pear, NP

## 2019-07-27 NOTE — Telephone Encounter (Signed)
Called pt for follow up Return in about 4 months (around 11/26/2019 & 1 week lab left msg to callback

## 2019-07-27 NOTE — Telephone Encounter (Signed)
See mychart.  

## 2019-08-04 ENCOUNTER — Ambulatory Visit: Payer: Medicare Other | Admitting: Cardiology

## 2019-08-04 ENCOUNTER — Ambulatory Visit: Payer: Medicare Other

## 2019-08-13 ENCOUNTER — Other Ambulatory Visit: Payer: Self-pay

## 2019-08-13 ENCOUNTER — Ambulatory Visit (INDEPENDENT_AMBULATORY_CARE_PROVIDER_SITE_OTHER): Payer: Medicare Other

## 2019-08-13 DIAGNOSIS — Z23 Encounter for immunization: Secondary | ICD-10-CM | POA: Diagnosis not present

## 2019-08-13 NOTE — Progress Notes (Signed)
Pt here today for flu vaccine.   Fluad 0.5 mL injected into L deltoid. Pt tolerated injection well.

## 2019-08-25 ENCOUNTER — Other Ambulatory Visit: Payer: Self-pay

## 2019-08-25 ENCOUNTER — Ambulatory Visit (INDEPENDENT_AMBULATORY_CARE_PROVIDER_SITE_OTHER): Payer: Medicare Other | Admitting: Cardiology

## 2019-08-25 ENCOUNTER — Encounter: Payer: Self-pay | Admitting: Cardiology

## 2019-08-25 VITALS — BP 150/90 | HR 66 | Temp 97.7°F | Ht 67.0 in | Wt 276.0 lb

## 2019-08-25 DIAGNOSIS — E088 Diabetes mellitus due to underlying condition with unspecified complications: Secondary | ICD-10-CM | POA: Diagnosis not present

## 2019-08-25 DIAGNOSIS — I7789 Other specified disorders of arteries and arterioles: Secondary | ICD-10-CM | POA: Diagnosis not present

## 2019-08-25 DIAGNOSIS — E782 Mixed hyperlipidemia: Secondary | ICD-10-CM | POA: Diagnosis not present

## 2019-08-25 DIAGNOSIS — Z1329 Encounter for screening for other suspected endocrine disorder: Secondary | ICD-10-CM

## 2019-08-25 DIAGNOSIS — I2510 Atherosclerotic heart disease of native coronary artery without angina pectoris: Secondary | ICD-10-CM

## 2019-08-25 NOTE — Patient Instructions (Signed)
Medication Instructions:  Your physician recommends that you continue on your current medications as directed. Please refer to the Current Medication list given to you today.  If you need a refill on your cardiac medications before your next appointment, please call your pharmacy.   Lab work: Your physician recommends that you have a BMP, CBC, TSH, hepatic and lipid drawn today.  If you have labs (blood work) drawn today and your tests are completely normal, you will receive your results only by: Marland Kitchen MyChart Message (if you have MyChart) OR . A paper copy in the mail If you have any lab test that is abnormal or we need to change your treatment, we will call you to review the results.  Testing/Procedures: You had an EKG performed today   Follow-Up: At [facility], you and your health needs are our priority.  As part of our continuing mission to provide you with exceptional heart care, we have created designated Provider Care Teams.  These Care Teams include your primary Cardiologist (physician) and Advanced Practice Providers (APPs -  Physician Assistants and Nurse Practitioners) who all work together to provide you with the care you need, when you need it. You will need a follow up appointment in 6 months.   Marland Kitchen

## 2019-08-25 NOTE — Addendum Note (Signed)
Addended by: Beckey Rutter on: 08/25/2019 09:14 AM   Modules accepted: Orders

## 2019-08-25 NOTE — Progress Notes (Signed)
Cardiology Office Note:    Date:  08/25/2019   ID:  Steven Payne, DOB [birth year], MRN [id number]  PCP:  [name], NP  Cardiologist:  Jenean Lindau, MD   Referring MD: [name], NP    ASSESSMENT:    1. Coronary artery disease involving native coronary artery of native heart without angina pectoris   2. Diabetes mellitus due to underlying condition with unspecified complications ([place])   3. Mixed hyperlipidemia   4. Coronary artery ectasia (HCC)    PLAN:    In order of problems listed above:  1. Coronary artery disease.  Secondary prevention stressed with the patient.  Importance of compliance with diet and medication stressed and he vocalized understanding. 2. Essential hypertension: Blood pressure stable 3. Mixed dyslipidemia: Diet was discussed for elevated lipids and obesity and risks of obesity explained and he vocalized understanding. 4. Patient mentions to me that his blood pressure is very stable at home and he checks his on a regular basis and maintained follow-up.  This appears to be fine 5. Patient will be seen in follow-up appointment in 6 months or earlier if the patient has any concerns    Medication Adjustments/Labs and Tests Ordered: Current medicines are reviewed at length with the patient today.  Concerns regarding medicines are outlined above.  No orders of the defined types were placed in this encounter.  No orders of the defined types were placed in this encounter.    Chief Complaint  Patient presents with  . Follow-up     History of Present Illness:    Steven Payne is a 73 y.o. male.  Patient has past medical history of coronary artery disease.  He denies any problems at this time and takes care of activities of daily living.  No chest pain orthopnea or PND.  He leads a sedentary lifestyle and unfortunately has not been compliant with dietary instructions.  At the time of my evaluation, the patient is alert awake oriented  and in no distress.  Past Medical History:  Diagnosis Date  . Arthritis   . Coronary artery disease cardiologist-  dr Irish Lack   per cardiac cath 06/ 2013--  no sig. cad, lvef 60%, sluggish ectatic coronary artery flow of lad, lcx, rc  . ED (erectile dysfunction)   . GERD (gastroesophageal reflux disease)   . History of gout    approx 2002  . Hyperlipemia   . Hypertension   . Hypogonadism male   . Left hydrocele   . Type 2 diabetes mellitus ([place])     Past Surgical History:  Procedure Laterality Date  . APPENDECTOMY  age 20's  . CARDIAC CATHETERIZATION  05-08-2012  dr Elonda Husky at [facility]   no significant cad, sluggish ectatic coronary artery flow (rca, lad, lcx), normal LVSF, ef 60%  . HYDROCELE EXCISION Left 09/10/2016   Procedure: HYDROCELECTOMY ADULT;  Surgeon: Kathie Rhodes, MD;  Location: [facility];  Service: Urology;  Laterality: Left;  . UMBILICAL HERNIA REPAIR  1980's    Current Medications: Current Meds  Medication Sig  . atorvastatin (LIPITOR) 40 MG tablet TAKE 1 TABLET DAILY  . clopidogrel (PLAVIX) 75 MG tablet TAKE 1 TABLET DAILY  . diltiazem (TIAZAC) 300 MG 24 hr capsule TAKE 1 CAPSULE EVERY MORNING (Patient taking differently: Take 300 mg by mouth daily. )  . famotidine (PEPCID) 20 MG tablet Take 1 tablet (20 mg total) by mouth 2 (two) times daily. (Patient taking differently: Take 20 mg by mouth daily. )  .  metFORMIN (GLUCOPHAGE) 1000 MG tablet TAKE 1 TABLET TWICE A DAY WITH MEALS ( THIS SHOULD REPLACE 500 MG TWICE A DAY )  . potassium chloride SA (K-DUR,KLOR-CON) 20 MEQ tablet Take 1 tablet (20 mEq total) by mouth daily. (Patient taking differently: Take 20 mEq by mouth 3 (three) times a week. )  . Testosterone 1.62 % GEL Place onto the skin.  . valsartan-hydrochlorothiazide (DIOVAN-HCT) 160-25 MG tablet TAKE 1 TABLET EVERY EVENING     Allergies:   Tanzeum [albiglutide]   Social History   Socioeconomic History  . Marital status: Married    Spouse  name: Not on file  . Number of children: Not on file  . Years of education: Not on file  . Highest education level: Not on file  Occupational History  . Not on file  Social Needs  . Financial resource strain: Not on file  . Food insecurity    Worry: Not on file    Inability: Not on file  . Transportation needs    Medical: Not on file    Non-medical: Not on file  Tobacco Use  . Smoking status: Former Smoker    Years: 1.00    Quit date: 03/27/1991    Years since quitting: 28.4  . Smokeless tobacco: Never Used  Substance and Sexual Activity  . Alcohol use: No    Alcohol/week: 0.0 standard drinks  . Drug use: No  . Sexual activity: Not on file  Lifestyle  . Physical activity    Days per week: Not on file    Minutes per session: Not on file  . Stress: Not on file  Relationships  . Social Herbalist on phone: Not on file    Gets together: Not on file    Attends religious service: Not on file    Active member of club or organization: Not on file    Attends meetings of clubs or organizations: Not on file    Relationship status: Not on file  Other Topics Concern  . 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  No grandchildren.  Oldest in [place], youngest in [place]   Completed bachelors, some graduate (Estate manager/land agent)   Enjoys travelling, walking, cruises     Family History: The patient's family history includes Alcohol abuse in his father and mother; Breast cancer in his paternal grandmother; Cancer in his mother; Coronary artery disease in his father and mother; Diabetes in his father; Prostate cancer in his father.  ROS:   Please see the history of present illness.    All other systems reviewed and are negative.  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   Recent Labs: 12/22/2018: BUN 7; Creatinine, Ser 0.88; Potassium 3.9; Sodium 140 05/12/2019: ALT 12  Recent Lipid Panel    Component Value Date/Time   CHOL 145 05/12/2019 1349   TRIG 128 05/12/2019 1349   HDL 38 (L) 05/12/2019 1349   CHOLHDL 3.8 05/12/2019 1349   CHOLHDL 4 06/02/2018 0831   VLDL 24.2 06/02/2018 0831   LDLCALC 81 05/12/2019 1349    Physical Exam:    VS:  BP (!) 150/90 (BP Location: Left Arm, Patient Position: Sitting, Cuff Size: Large)   Pulse 66  Temp 97.7 F (36.5 C)   Ht 5\' 7"  (1.702 m)   Wt 276 lb (125.2 kg)   SpO2 100%   BMI 43.23 kg/m     Wt Readings from Last 3 Encounters:  08/25/19 276 lb (125.2 kg)  07/27/19 265 lb (120.2 kg)  05/01/19 270 lb (122.5 kg)     GEN: Patient is in no acute distress HEENT: Normal NECK: No JVD; No carotid bruits LYMPHATICS: No lymphadenopathy CARDIAC: Hear sounds regular, 2/6 systolic murmur at the apex. RESPIRATORY:  Clear to auscultation without rales, wheezing or rhonchi  ABDOMEN: Soft, non-tender, non-distended MUSCULOSKELETAL:  No edema; No deformity  SKIN: Warm and dry NEUROLOGIC:  Alert and oriented x 3 PSYCHIATRIC:  Normal affect   Signed, Jenean Lindau, MD  08/25/2019 8:50 AM    [place]

## 2019-08-26 LAB — HEPATIC FUNCTION PANEL
ALT: 13 IU/L (ref 0–44)
AST: 22 IU/L (ref 0–40)
Albumin: 4.4 g/dL (ref 3.7–4.7)
Alkaline Phosphatase: 99 IU/L (ref 39–117)
Bilirubin Total: 0.6 mg/dL (ref 0.0–1.2)
Bilirubin, Direct: 0.12 mg/dL (ref 0.00–0.40)
Total Protein: 7.2 g/dL (ref 6.0–8.5)

## 2019-08-26 LAB — CBC
Hematocrit: 43.6 % (ref 37.5–51.0)
Hemoglobin: 14.6 g/dL (ref 13.0–17.7)
MCH: 27.8 pg (ref 26.6–33.0)
MCHC: 33.5 g/dL (ref 31.5–35.7)
MCV: 83 fL (ref 79–97)
Platelets: 322 10*3/uL (ref 150–450)
RBC: 5.25 x10E6/uL (ref 4.14–5.80)
RDW: 14.1 % (ref 11.6–15.4)
WBC: 6 10*3/uL (ref 3.4–10.8)

## 2019-08-26 LAB — BASIC METABOLIC PANEL
BUN/Creatinine Ratio: 10 (ref 10–24)
BUN: 8 mg/dL (ref 8–27)
CO2: 22 mmol/L (ref 20–29)
Calcium: 9.2 mg/dL (ref 8.6–10.2)
Chloride: 99 mmol/L (ref 96–106)
Creatinine, Ser: 0.8 mg/dL (ref 0.76–1.27)
GFR calc Af Amer: 102 mL/min/{1.73_m2} (ref >59)
GFR calc non Af Amer: 88 mL/min/{1.73_m2} (ref >59)
Glucose: 79 mg/dL (ref 65–99)
Potassium: 4.2 mmol/L (ref 3.5–5.2)
Sodium: 141 mmol/L (ref 134–144)

## 2019-08-26 LAB — TSH: TSH: 2.48 u[IU]/mL (ref 0.450–4.500)

## 2019-08-26 LAB — LIPID PANEL
Chol/HDL Ratio: 4.1 ratio (ref 0.0–5.0)
Cholesterol, Total: 151 mg/dL (ref 100–199)
HDL: 37 mg/dL — ABNORMAL LOW (ref >39)
LDL Chol Calc (NIH): 89 mg/dL (ref 0–99)
Triglycerides: 138 mg/dL (ref 0–149)
VLDL Cholesterol Cal: 25 mg/dL (ref 5–40)

## 2019-09-01 ENCOUNTER — Telehealth: Payer: Self-pay

## 2019-09-01 NOTE — Telephone Encounter (Signed)
-----   Message from Jenean Lindau, MD sent at 08/26/2019  8:13 AM EDT ----- The results of the study is unremarkable. Please inform patient. I will discuss in detail at next appointment. Cc  primary care/referring physician Jenean Lindau, MD 08/26/2019 8:13 AM

## 2019-09-01 NOTE — Telephone Encounter (Signed)
Left message for patient to call office for results. Copy sent to Dr. Inda Castle.

## 2019-09-02 NOTE — Telephone Encounter (Signed)
Results relayed to pt cell vm that results were good, copy sent to PCP.

## 2019-09-16 ENCOUNTER — Encounter: Payer: Self-pay | Admitting: Internal Medicine

## 2019-09-16 ENCOUNTER — Ambulatory Visit (INDEPENDENT_AMBULATORY_CARE_PROVIDER_SITE_OTHER): Payer: Medicare Other | Admitting: Family

## 2019-09-16 ENCOUNTER — Encounter: Payer: Self-pay | Admitting: Family

## 2019-09-16 ENCOUNTER — Other Ambulatory Visit: Payer: Self-pay

## 2019-09-16 ENCOUNTER — Ambulatory Visit (INDEPENDENT_AMBULATORY_CARE_PROVIDER_SITE_OTHER): Payer: Medicare Other | Admitting: Internal Medicine

## 2019-09-16 VITALS — BP 140/89 | HR 66 | Ht 67.0 in | Wt 270.0 lb

## 2019-09-16 VITALS — BP 123/58 | HR 78 | Temp 97.8°F | Resp 16 | Ht 67.0 in | Wt 270.0 lb

## 2019-09-16 DIAGNOSIS — I25119 Atherosclerotic heart disease of native coronary artery with unspecified angina pectoris: Secondary | ICD-10-CM

## 2019-09-16 DIAGNOSIS — K6289 Other specified diseases of anus and rectum: Secondary | ICD-10-CM

## 2019-09-16 MED ORDER — HYDROCORTISONE ACETATE 25 MG RE SUPP: 25.0000 mg | Freq: Two times a day (BID) | RECTAL | 0 refills | Status: DC | PRN

## 2019-09-16 NOTE — Progress Notes (Signed)
Virtual Visit via Video Note  I connected with Steven Payne on 09/16/19 at 11:20 AM EDT by a video enabled telemedicine application and verified that I am speaking with the correct person using two identifiers.  Location: Patient: home Provider: home   I discussed the limitations of evaluation and management by telemedicine and the availability of in person appointments. The patient expressed understanding and agreed to proceed.  History of Present Illness:  Patient is a 73 yr old male who presents today with chief complaint of rectal pain.   Reports + rectal pain, denies known hx of hsv.  Notes a tingling feeling sometimes.  Does not seem to hurt worse with bowel movements. Denies associated blood. Thinks that there might be a rash there and maybe a "bulge" there as well.  States that he one before had a "lump there" and they had to remove it.  Not sure if it was an abscess or exactly what they removed.  His last colonoscopy was 8/18.   Past Medical History:  Diagnosis Date  . Arthritis   . Coronary artery disease cardiologist-  dr Irish Lack   per cardiac cath 06/ 2013--  no sig. cad, lvef 60%, sluggish ectatic coronary artery flow of lad, lcx, rc  . ED (erectile dysfunction)   . GERD (gastroesophageal reflux disease)   . History of gout    approx 2002  . Hyperlipemia   . Hypertension   . Hypogonadism male   . Left hydrocele   . Type 2 diabetes mellitus ([place])      Social History   Socioeconomic History  . Marital status: Married    Spouse name: Not on file  . Number of children: Not on file  . Years of education: Not on file  . Highest education level: Not on file  Occupational History  . Not on file  Social Needs  . Financial resource strain: Not on file  . Food insecurity    Worry: Not on file    Inability: Not on file  . Transportation needs    Medical: Not on file    Non-medical: Not on file  Tobacco Use  . Smoking status: Former Smoker    Years: 1.00   Quit date: 03/27/1991    Years since quitting: 28.4  . Smokeless tobacco: Never Used  Substance and Sexual Activity  . Alcohol use: No    Alcohol/week: 0.0 standard drinks  . Drug use: No  . Sexual activity: Not on file  Lifestyle  . Physical activity    Days per week: Not on file    Minutes per session: Not on file  . Stress: Not on file  Relationships  . Social Herbalist on phone: Not on file    Gets together: Not on file    Attends religious service: Not on file    Active member of club or organization: Not on file    Attends meetings of clubs or organizations: Not on file    Relationship status: Not on file  . Intimate partner violence    Fear of current or ex partner: Not on file    Emotionally abused: Not on file    Physically abused: Not on file    Forced sexual activity: Not on file  Other Topics Concern  . 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  No grandchildren.  Oldest in [place], youngest in [place]   Completed bachelors, some graduate (Estate manager/land agent)   Enjoys travelling, walking, cruises    Past Surgical History:  Procedure Laterality Date  . APPENDECTOMY  age 65's  . CARDIAC CATHETERIZATION  05-08-2012  dr Elonda Husky at [facility]   no significant cad, sluggish ectatic coronary artery flow (rca, lad, lcx), normal LVSF, ef 60%  . HYDROCELE EXCISION Left 09/10/2016   Procedure: HYDROCELECTOMY ADULT;  Surgeon: Kathie Rhodes, MD;  Location: [facility];  Service: Urology;  Laterality: Left;  . UMBILICAL HERNIA REPAIR  1980's    Family History  Problem Relation Age of Onset  . Coronary artery disease Father   . Diabetes Father   . Prostate cancer Father        died at 65  . Alcohol abuse Father   . Coronary artery disease Mother        died age 19  . Alcohol abuse Mother   . Cancer Mother         unsure, + thyroid cancer  . Breast cancer Paternal Grandmother     Allergies  Allergen Reactions  . Tanzeum [Albiglutide] Other (See Comments)    constipation    Current Outpatient Medications on File Prior to Visit  Medication Sig Dispense Refill  . atorvastatin (LIPITOR) 40 MG tablet TAKE 1 TABLET DAILY 90 tablet 4  . clopidogrel (PLAVIX) 75 MG tablet TAKE 1 TABLET DAILY 90 tablet 3  . diltiazem (TIAZAC) 300 MG 24 hr capsule TAKE 1 CAPSULE EVERY MORNING (Patient taking differently: Take 300 mg by mouth daily. ) 90 capsule 4  . famotidine (PEPCID) 20 MG tablet Take 1 tablet (20 mg total) by mouth 2 (two) times daily. (Patient taking differently: Take 20 mg by mouth daily. ) 180 tablet 1  . metFORMIN (GLUCOPHAGE) 1000 MG tablet TAKE 1 TABLET TWICE A DAY WITH MEALS ( THIS SHOULD REPLACE 500 MG TWICE A DAY ) 180 tablet 4  . potassium chloride SA (K-DUR,KLOR-CON) 20 MEQ tablet Take 1 tablet (20 mEq total) by mouth daily. (Patient taking differently: Take 20 mEq by mouth 3 (three) times a week. ) 90 tablet 1  . Testosterone 1.62 % GEL Place onto the skin.    . valsartan-hydrochlorothiazide (DIOVAN-HCT) 160-25 MG tablet TAKE 1 TABLET EVERY EVENING 90 tablet 4   No current facility-administered medications on file prior to visit.     BP 140/89 (BP Location: Right Arm, Patient Position: Sitting)   Pulse 66   Ht 5\' 7"  (1.702 m)   Wt 270 lb (122.5 kg)   BMI 42.29 kg/m       Observations/Objective:   Gen: Awake, alert, no acute distress Resp: Breathing is even and non-labored Psych: calm/pleasant demeanor Neuro: Alert and Oriented x 3, + facial symmetry, speech is clear.   Assessment and Plan:  Rectal pain- etiology unclear. Differential includes hemorrhoid, HSV, rectal abscess, rectal fissure. I advised the patient that he really needs a face to face exam. Will arrange exam later this afternoon with one of my colleagues.    Follow Up Instructions:    I discussed the  assessment and treatment plan with the patient. The patient was provided an opportunity to ask questions and all were answered. The patient agreed with the plan and demonstrated an understanding of the instructions.   The patient was advised to call back or seek an in-person evaluation if the symptoms worsen or if  the condition fails to improve as anticipated.  Nance Pear, NP

## 2019-09-16 NOTE — Patient Instructions (Signed)
   GO TO THE FRONT DESK Schedule your next appointment to see your primary doctor in 4 to 6 weeks  Use the suppository twice a day for 3 days, then only as needed  Call anytime if he has severe pain, itching or any bleeding.  If you are not better they may need to do a scope.

## 2019-09-16 NOTE — Progress Notes (Signed)
Subjective:    Patient ID: Steven Payne, male    DOB: 06-17-1946, 73 y.o.   MRN: [id number]  DOS:  09/16/2019 Type of visit - description: Acute Patient was seen earlier today virtually, chief complaint was rectal pain, recommended to come to the office to be seen.   Patient reports that a week ago started to feel something in the ano- rectal area: Tingling, minimal discomfort.  He self examined the area and felt a lump inside. He has a history of hemorrhoids with a remote surgery many years ago.  Review of Systems  No fever chills No nausea, vomiting, diarrhea.  No blood in the stool. BMs are normal specifically no pencillike stools.   Past Medical History:  Diagnosis Date  . Arthritis   . Coronary artery disease cardiologist-  dr Irish Lack   per cardiac cath 06/ 2013--  no sig. cad, lvef 60%, sluggish ectatic coronary artery flow of lad, lcx, rc  . ED (erectile dysfunction)   . GERD (gastroesophageal reflux disease)   . History of gout    approx 2002  . Hyperlipemia   . Hypertension   . Hypogonadism male   . Left hydrocele   . Type 2 diabetes mellitus ([place])     Past Surgical History:  Procedure Laterality Date  . APPENDECTOMY  age 34's  . CARDIAC CATHETERIZATION  05-08-2012  dr Elonda Husky at [facility]   no significant cad, sluggish ectatic coronary artery flow (rca, lad, lcx), normal LVSF, ef 60%  . HYDROCELE EXCISION Left 09/10/2016   Procedure: HYDROCELECTOMY ADULT;  Surgeon: Kathie Rhodes, MD;  Location: [facility];  Service: Urology;  Laterality: Left;  . UMBILICAL HERNIA REPAIR  1980's    Social History   Socioeconomic History  . Marital status: Married    Spouse name: Not on file  . Number of children: Not on file  . Years of education: Not on file  . Highest education level: Not on file  Occupational History  . Not on file  Social Needs  . Financial resource strain: Not on file  . Food insecurity    Worry: Not on file    Inability: Not on file    . Transportation needs    Medical: Not on file    Non-medical: Not on file  Tobacco Use  . Smoking status: Former Smoker    Years: 1.00    Quit date: 03/27/1991    Years since quitting: 28.4  . Smokeless tobacco: Never Used  Substance and Sexual Activity  . Alcohol use: No    Alcohol/week: 0.0 standard drinks  . Drug use: No  . Sexual activity: Not on file  Lifestyle  . Physical activity    Days per week: Not on file    Minutes per session: Not on file  . Stress: Not on file  Relationships  . Social Herbalist on phone: Not on file    Gets together: Not on file    Attends religious service: Not on file    Active member of club or organization: Not on file    Attends meetings of clubs or organizations: Not on file    Relationship status: Not on file  . Intimate partner violence    Fear of current or ex partner: Not on file    Emotionally abused: Not on file    Physically abused: Not on file    Forced sexual activity: Not on file  Other Topics Concern  . 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  No grandchildren.  Oldest in [place], youngest in [place]   Completed bachelors, some graduate (Estate manager/land agent)   Enjoys travelling, walking, cruises      Allergies as of 09/16/2019      Reactions   Tanzeum [albiglutide] Other (See Comments)   constipation      Medication List       Accurate as of September 16, 2019  3:06 PM. If you have any questions, ask your nurse or doctor.        atorvastatin 40 MG tablet Commonly known as: LIPITOR TAKE 1 TABLET DAILY   clopidogrel 75 MG tablet Commonly known as: PLAVIX TAKE 1 TABLET DAILY   diltiazem 300 MG 24 hr capsule Commonly known as: TIAZAC TAKE 1 CAPSULE EVERY MORNING What changed: when to take this   famotidine 20 MG tablet Commonly known as: PEPCID Take 1  tablet (20 mg total) by mouth 2 (two) times daily. What changed: when to take this   metFORMIN 1000 MG tablet Commonly known as: GLUCOPHAGE TAKE 1 TABLET TWICE A DAY WITH MEALS ( THIS SHOULD REPLACE 500 MG TWICE A DAY )   potassium chloride SA 20 MEQ tablet Commonly known as: KLOR-CON Take 1 tablet (20 mEq total) by mouth daily. What changed: when to take this   Testosterone 1.62 % Gel Place onto the skin.   valsartan-hydrochlorothiazide 160-25 MG tablet Commonly known as: DIOVAN-HCT TAKE 1 TABLET EVERY EVENING           Objective:   Physical Exam BP (!) 123/58 (BP Location: Left Arm, Patient Position: Sitting, Cuff Size: Normal)   Pulse 78   Temp 97.8 F (36.6 C) (Temporal)   Resp 16   Ht 5\' 7"  (1.702 m)   Wt 270 lb (122.5 kg)   SpO2 96%   BMI 42.29 kg/m  General:   Well developed, NAD, BMI noted. HEENT:  Normocephalic . Face symmetric, atraumatic Abdomen: Soft, nontender. DRE: External examination normal, normal sphincter tone, normal prostate, no mass. Anoscopy: Several moderate size hemorrhoids without bleeding.  No pain noted during the exam. Skin: Not pale. Not jaundice Neurologic:  alert & oriented X3.  Speech normal, gait appropriate for age and unassisted Psych--  Cognition and judgment appear intact.  Cooperative with normal attention span and concentration.  Behavior appropriate. No anxious or depressed appearing.      Assessment     73 year old gentleman, PMH includes HTN, DM, high cholesterol, CAD, presents with  Ano- rectal discomfort: As described above, he has a history of surgery in that area, he thinks he had hemorrhoidectomy more than 30 years ago. He also had colonoscopy in 2018 reportedly normal.  I feel no mass, he does have hemorrhoids, see physical exam, no evidence of thrombosis. Plan: Anusol for 3 days, then as needed, reassess by PCP in few weeks, call sooner if problems, see AVS

## 2019-09-16 NOTE — Progress Notes (Signed)
Pre visit review using our clinic review tool, if applicable. No additional management support is needed unless otherwise documented below in the visit note. 

## 2019-09-25 ENCOUNTER — Ambulatory Visit: Payer: Medicare Other | Admitting: Family

## 2019-10-08 DIAGNOSIS — H52222 Regular astigmatism, left eye: Secondary | ICD-10-CM | POA: Diagnosis not present

## 2019-10-08 DIAGNOSIS — E119 Type 2 diabetes mellitus without complications: Secondary | ICD-10-CM | POA: Diagnosis not present

## 2019-10-08 DIAGNOSIS — H5212 Myopia, left eye: Secondary | ICD-10-CM | POA: Diagnosis not present

## 2019-10-08 DIAGNOSIS — H52221 Regular astigmatism, right eye: Secondary | ICD-10-CM | POA: Diagnosis not present

## 2019-10-08 DIAGNOSIS — H2513 Age-related nuclear cataract, bilateral: Secondary | ICD-10-CM | POA: Diagnosis not present

## 2019-10-08 DIAGNOSIS — H04123 Dry eye syndrome of bilateral lacrimal glands: Secondary | ICD-10-CM | POA: Diagnosis not present

## 2019-10-08 DIAGNOSIS — H524 Presbyopia: Secondary | ICD-10-CM | POA: Diagnosis not present

## 2019-10-08 LAB — HM DIABETES EYE EXAM

## 2019-12-02 ENCOUNTER — Other Ambulatory Visit: Payer: Self-pay | Admitting: Family

## 2019-12-04 DIAGNOSIS — D492 Neoplasm of unspecified behavior of bone, soft tissue, and skin: Secondary | ICD-10-CM

## 2019-12-04 HISTORY — DX: Neoplasm of unspecified behavior of bone, soft tissue, and skin: D49.2

## 2019-12-18 ENCOUNTER — Telehealth: Payer: Self-pay | Admitting: Family

## 2019-12-18 NOTE — Telephone Encounter (Signed)
Please contact pt to schedule a follow up appointment. Virtual is OK.

## 2019-12-30 ENCOUNTER — Ambulatory Visit: Payer: Medicare Other

## 2020-01-01 ENCOUNTER — Encounter: Payer: Self-pay | Admitting: Family

## 2020-01-01 ENCOUNTER — Ambulatory Visit (INDEPENDENT_AMBULATORY_CARE_PROVIDER_SITE_OTHER): Payer: Medicare Other | Admitting: Family

## 2020-01-01 ENCOUNTER — Other Ambulatory Visit: Payer: Self-pay

## 2020-01-01 VITALS — BP 136/81 | Temp 98.7°F | Wt 268.0 lb

## 2020-01-01 DIAGNOSIS — E785 Hyperlipidemia, unspecified: Secondary | ICD-10-CM | POA: Diagnosis not present

## 2020-01-01 DIAGNOSIS — K219 Gastro-esophageal reflux disease without esophagitis: Secondary | ICD-10-CM | POA: Diagnosis not present

## 2020-01-01 DIAGNOSIS — E119 Type 2 diabetes mellitus without complications: Secondary | ICD-10-CM | POA: Diagnosis not present

## 2020-01-01 DIAGNOSIS — I2510 Atherosclerotic heart disease of native coronary artery without angina pectoris: Secondary | ICD-10-CM

## 2020-01-01 DIAGNOSIS — E782 Mixed hyperlipidemia: Secondary | ICD-10-CM | POA: Diagnosis not present

## 2020-01-01 MED ORDER — METFORMIN HCL 1000 MG PO TABS: ORAL_TABLET | ORAL | 1 refills | Status: DC

## 2020-01-01 MED ORDER — FAMOTIDINE 20 MG PO TABS: 20.0000 mg | ORAL_TABLET | Freq: Every day | ORAL | 1 refills | Status: DC | PRN

## 2020-01-01 MED ORDER — ATORVASTATIN CALCIUM 40 MG PO TABS: 40.0000 mg | ORAL_TABLET | Freq: Every day | ORAL | 1 refills | Status: DC

## 2020-01-01 MED ORDER — DILTIAZEM HCL ER BEADS 300 MG PO CP24: 300.0000 mg | ORAL_CAPSULE | Freq: Every morning | ORAL | 0 refills | Status: DC

## 2020-01-01 MED ORDER — VALSARTAN-HYDROCHLOROTHIAZIDE 160-25 MG PO TABS: 1.0000 | ORAL_TABLET | Freq: Every evening | ORAL | 1 refills | Status: DC

## 2020-01-01 NOTE — Progress Notes (Signed)
Subjective:    Patient ID: Steven Payne, male    DOB: [birth year], 74 y.o.   MRN: [id number]  HPI  I connected with Steven Payne on 01/01/20 at  7:20 AM EST by a video enabled telemedicine application and verified that I am speaking with the correct person using two identifiers.  Location: Patient: home Provider: work   I discussed the limitations of evaluation and management by telemedicine and the availability of in person appointments. The patient expressed understanding and agreed to proceed.   Patient is a 74 yr old male who presents today for follow up.  DM2- continue metformin. He checks sugars once every 2-3 days. Typically 110-120.   Lab Results  Component Value Date   HGBA1C 6.5 12/22/2018   HGBA1C 6.9 (H) 09/08/2018   HGBA1C 7.5 (H) 06/02/2018   Lab Results  Component Value Date   MICROALBUR 1.1 03/11/2017   LDLCALC 89 08/25/2019   CREATININE 0.80 08/25/2019   Hyperlipidemia- maintained on lipitor 40mg . Lab Results  Component Value Date   CHOL 151 08/25/2019   HDL 37 (L) 08/25/2019   LDLCALC 89 08/25/2019   TRIG 138 08/25/2019   CHOLHDL 4.1 08/25/2019   HTN- maintained on diovan HCT.  BP Readings from Last 3 Encounters:  01/01/20 136/81  09/16/19 (!) 123/58  09/16/19 140/89   GERD- taking pepcid 20mg  once daily prn. Does not need every day  Hx of CAD- followed by Dr. Geraldo Pitter (cardiology) and maintained on plavix.   Hypogonadism- he is followed by urology who is prescribing testosterone gel.    Review of Systems See HPI  Past Medical History:  Diagnosis Date  . Arthritis   . Coronary artery disease cardiologist-  dr Irish Lack   per cardiac cath 06/ 2013--  no sig. cad, lvef 60%, sluggish ectatic coronary artery flow of lad, lcx, rc  . ED (erectile dysfunction)   . GERD (gastroesophageal reflux disease)   . History of gout    approx 2002  . Hyperlipemia   . Hypertension   . Hypogonadism male   . Left hydrocele   . Type 2 diabetes  mellitus ([place])      Social History   Socioeconomic History  . Marital status: Married    Spouse name: Not on file  . Number of children: Not on file  . Years of education: Not on file  . Highest education level: Not on file  Occupational History  . Not on file  Tobacco Use  . Smoking status: Former Smoker    Years: 1.00    Quit date: 03/27/1991    Years since quitting: 28.7  . Smokeless tobacco: Never Used  Substance and Sexual Activity  . Alcohol use: No    Alcohol/week: 0.0 standard drinks  . Drug use: No  . Sexual activity: Not on file  Other Topics Concern  . 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  No grandchildren.  Oldest in [place], youngest in [place]   Completed bachelors, some graduate (Estate manager/land agent)   Enjoys travelling, walking, cruises   Social Determinants of Health   Financial Resource Strain:   . Difficulty of Paying Living Expenses: Not on file  Food Insecurity:   . Worried About Charity fundraiser in the Last Year: Not on file  . Ran Out of Food  in the Last Year: Not on file  Transportation Needs:   . Lack of Transportation (Medical): Not on file  . Lack of Transportation (Non-Medical): Not on file  Physical Activity:   . Days of Exercise per Week: Not on file  . Minutes of Exercise per Session: Not on file  Stress:   . Feeling of Stress : Not on file  Social Connections:   . Frequency of Communication with Friends and Family: Not on file  . Frequency of Social Gatherings with Friends and Family: Not on file  . Attends Religious Services: Not on file  . Active Member of Clubs or Organizations: Not on file  . Attends Archivist Meetings: Not on file  . Marital Status: Not on file  Intimate Partner Violence:   . Fear of Current or Ex-Partner: Not on file  . Emotionally Abused: Not on file    . Physically Abused: Not on file  . Sexually Abused: Not on file    Past Surgical History:  Procedure Laterality Date  . APPENDECTOMY  age 52's  . CARDIAC CATHETERIZATION  05-08-2012  dr Elonda Husky at [facility]   no significant cad, sluggish ectatic coronary artery flow (rca, lad, lcx), normal LVSF, ef 60%  . HEMORROIDECTOMY  1985 ?   ??  . HYDROCELE EXCISION Left 09/10/2016   Procedure: HYDROCELECTOMY ADULT;  Surgeon: Kathie Rhodes, MD;  Location: [facility];  Service: Urology;  Laterality: Left;  . UMBILICAL HERNIA REPAIR  1980's    Family History  Problem Relation Age of Onset  . Coronary artery disease Father   . Diabetes Father   . Prostate cancer Father        died at 72  . Alcohol abuse Father   . Coronary artery disease Mother        died age 81  . Alcohol abuse Mother   . Cancer Mother        unsure, + thyroid cancer  . Breast cancer Paternal Grandmother     Allergies  Allergen Reactions  . Tanzeum [Albiglutide] Other (See Comments)    constipation    Current Outpatient Medications on File Prior to Visit  Medication Sig Dispense Refill  . atorvastatin (LIPITOR) 40 MG tablet TAKE 1 TABLET DAILY 90 tablet 4  . clopidogrel (PLAVIX) 75 MG tablet TAKE 1 TABLET DAILY 90 tablet 3  . diltiazem (TIAZAC) 300 MG 24 hr capsule TAKE 1 CAPSULE EVERY MORNING 90 capsule 0  . famotidine (PEPCID) 20 MG tablet Take 1 tablet (20 mg total) by mouth 2 (two) times daily. (Patient taking differently: Take 20 mg by mouth daily. ) 180 tablet 1  . hydrocortisone (ANUSOL-HC) 25 MG suppository Place 1 suppository (25 mg total) rectally 2 (two) times daily as needed for hemorrhoids. 12 suppository 0  . metFORMIN (GLUCOPHAGE) 1000 MG tablet TAKE 1 TABLET TWICE A DAY WITH MEALS ( THIS SHOULD REPLACE 500 MG TWICE A DAY ) 180 tablet 4  . potassium chloride SA (K-DUR,KLOR-CON) 20 MEQ tablet Take 1 tablet (20 mEq total) by mouth daily. (Patient taking differently: Take 20 mEq by mouth 3  (three) times a week. ) 90 tablet 1  . Testosterone 1.62 % GEL Place onto the skin.    . valsartan-hydrochlorothiazide (DIOVAN-HCT) 160-25 MG tablet TAKE 1 TABLET EVERY EVENING 90 tablet 4   No current facility-administered medications on file prior to visit.    BP 136/81   Temp 98.7 F (37.1 C) (Temporal)   Wt 268 lb (  121.6 kg)   BMI 41.97 kg/m       Objective:   Physical Exam   Gen: Awake, alert, no acute distress Resp: Breathing is even and non-labored Psych: calm/pleasant demeanor Neuro: Alert and Oriented x 3, + facial symmetry, speech is clear.       Assessment & Plan:  HTN- bp stable, continue current meds, obtain follow up lab work. He is not taking potassium. Will see how his K+.   DM2- stable on metformin. Obtain A1C.  Hyperlipidemia- tolerating statin. Obtain follow up lipid panel.  CAD- clinically stable. Management per cardiology.  GERD- stable on prn pepcid.  Follow Up Instructions:    I discussed the assessment and treatment plan with the patient. The patient was provided an opportunity to ask questions and all were answered. The patient agreed with the plan and demonstrated an understanding of the instructions.   The patient was advised to call back or seek an in-person evaluation if the symptoms worsen or if the condition fails to improve as anticipated.

## 2020-01-05 ENCOUNTER — Encounter: Payer: Self-pay | Admitting: Family

## 2020-01-05 ENCOUNTER — Other Ambulatory Visit: Payer: Self-pay

## 2020-01-05 ENCOUNTER — Other Ambulatory Visit (INDEPENDENT_AMBULATORY_CARE_PROVIDER_SITE_OTHER): Payer: Medicare Other

## 2020-01-05 DIAGNOSIS — E785 Hyperlipidemia, unspecified: Secondary | ICD-10-CM | POA: Diagnosis not present

## 2020-01-05 DIAGNOSIS — E119 Type 2 diabetes mellitus without complications: Secondary | ICD-10-CM

## 2020-01-05 LAB — HEPATIC FUNCTION PANEL
ALT: 12 U/L (ref 0–53)
AST: 17 U/L (ref 0–37)
Albumin: 4.4 g/dL (ref 3.5–5.2)
Alkaline Phosphatase: 86 U/L (ref 39–117)
Bilirubin, Direct: 0.1 mg/dL (ref 0.0–0.3)
Total Bilirubin: 0.6 mg/dL (ref 0.2–1.2)
Total Protein: 7.2 g/dL (ref 6.0–8.3)

## 2020-01-05 LAB — LIPID PANEL
Cholesterol: 161 mg/dL (ref 0–200)
HDL: 36.3 mg/dL — ABNORMAL LOW (ref >39.00)
LDL Cholesterol: 91 mg/dL (ref 0–99)
NonHDL: 125.17
Total CHOL/HDL Ratio: 4
Triglycerides: 169 mg/dL — ABNORMAL HIGH (ref 0.0–149.0)
VLDL: 33.8 mg/dL (ref 0.0–40.0)

## 2020-01-05 LAB — BASIC METABOLIC PANEL
BUN: 1 mg/dL — ABNORMAL LOW (ref 6–23)
CO2: 25 mEq/L (ref 19–32)
Calcium: 9.2 mg/dL (ref 8.4–10.5)
Chloride: 100 mEq/L (ref 96–112)
Creatinine, Ser: 0.83 mg/dL (ref 0.40–1.50)
GFR: 109.22 mL/min (ref >60.00)
Glucose, Bld: 89 mg/dL (ref 70–99)
Potassium: 3.5 mEq/L (ref 3.5–5.1)
Sodium: 137 mEq/L (ref 135–145)

## 2020-01-05 LAB — HEMOGLOBIN A1C: Hgb A1c MFr Bld: 6.7 % — ABNORMAL HIGH (ref 4.6–6.5)

## 2020-01-08 ENCOUNTER — Ambulatory Visit: Payer: Medicare Other | Attending: Internal Medicine

## 2020-01-08 DIAGNOSIS — Z23 Encounter for immunization: Secondary | ICD-10-CM | POA: Insufficient documentation

## 2020-01-08 NOTE — Progress Notes (Signed)
   Covid-19 Vaccination Clinic  Name:  Steven Payne    MRN: [id number] DOB: May 27, 1946  01/08/2020  Steven Payne was observed post Covid-19 immunization for 15 minutes without incidence. He was provided with Vaccine Information Sheet and instruction to access the V-Safe system.   Steven Payne was instructed to call 911 with any severe reactions post vaccine: Marland Kitchen Difficulty breathing  . Swelling of your face and throat  . A fast heartbeat  . A bad rash all over your body  . Dizziness and weakness    Immunizations Administered    Name Date Dose VIS Date Route   Pfizer COVID-19 Vaccine 01/08/2020  8:13 AM 0.3 mL 11/13/2019 Intramuscular   Manufacturer: [place]   Lot: [id number]   [place]: [id number]

## 2020-01-22 ENCOUNTER — Encounter: Payer: Self-pay | Admitting: Family

## 2020-01-22 ENCOUNTER — Ambulatory Visit (HOSPITAL_BASED_OUTPATIENT_CLINIC_OR_DEPARTMENT_OTHER)
Admission: RE | Admit: 2020-01-22 | Discharge: 2020-01-22 | Disposition: A | Discharge status: Home / Self Care | Payer: Medicare Other | Source: Ambulatory Visit | Attending: Family | Admitting: Family

## 2020-01-22 ENCOUNTER — Ambulatory Visit (INDEPENDENT_AMBULATORY_CARE_PROVIDER_SITE_OTHER): Payer: Medicare Other | Admitting: Family

## 2020-01-22 ENCOUNTER — Other Ambulatory Visit: Payer: Self-pay

## 2020-01-22 VITALS — BP 151/86 | HR 71 | Temp 97.0°F | Resp 16 | Ht 67.0 in | Wt 278.0 lb

## 2020-01-22 DIAGNOSIS — M25571 Pain in right ankle and joints of right foot: Secondary | ICD-10-CM | POA: Insufficient documentation

## 2020-01-22 DIAGNOSIS — S82431A Displaced oblique fracture of shaft of right fibula, initial encounter for closed fracture: Secondary | ICD-10-CM | POA: Diagnosis not present

## 2020-01-22 DIAGNOSIS — I2510 Atherosclerotic heart disease of native coronary artery without angina pectoris: Secondary | ICD-10-CM

## 2020-01-22 NOTE — Patient Instructions (Signed)
Please complete x-ray on the first floor.  

## 2020-01-22 NOTE — Progress Notes (Signed)
Subjective:    Patient ID: Steven Payne, male    DOB: 1946-09-29, 74 y.o.   MRN: [id number]  HPI   Patient is a 74 yr old male who presents today with chief complaint of Right sided ankle pain.  Reports that he fell on 01/20/20. Tripped coming down the stairs. Able to weight bear but has pain.    Review of Systems Past Medical History:  Diagnosis Date  . Arthritis   . Coronary artery disease cardiologist-  dr Irish Lack   per cardiac cath 06/ 2013--  no sig. cad, lvef 60%, sluggish ectatic coronary artery flow of lad, lcx, rc  . ED (erectile dysfunction)   . GERD (gastroesophageal reflux disease)   . History of gout    approx 2002  . Hyperlipemia   . Hypertension   . Hypogonadism male   . Left hydrocele   . Type 2 diabetes mellitus ([place])      Social History   Socioeconomic History  . Marital status: Married    Spouse name: Not on file  . Number of children: Not on file  . Years of education: Not on file  . Highest education level: Not on file  Occupational History  . Not on file  Tobacco Use  . Smoking status: Former Smoker    Years: 1.00    Quit date: 03/27/1991    Years since quitting: 28.8  . Smokeless tobacco: Never Used  Substance and Sexual Activity  . Alcohol use: No    Alcohol/week: 0.0 standard drinks  . Drug use: No  . Sexual activity: Not on file  Other Topics Concern  . 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  No grandchildren.  Oldest in [place], youngest in [place]   Completed bachelors, some graduate (Estate manager/land agent)   Enjoys travelling, walking, cruises   Social Determinants of Health   Financial Resource Strain:   . Difficulty of Paying Living Expenses: Not on file  Food Insecurity:   . Worried About Charity fundraiser in the Last Year: Not on file  . Ran Out of Food in the Last Year: Not on  file  Transportation Needs:   . Lack of Transportation (Medical): Not on file  . Lack of Transportation (Non-Medical): Not on file  Physical Activity:   . Days of Exercise per Week: Not on file  . Minutes of Exercise per Session: Not on file  Stress:   . Feeling of Stress : Not on file  Social Connections:   . Frequency of Communication with Friends and Family: Not on file  . Frequency of Social Gatherings with Friends and Family: Not on file  . Attends Religious Services: Not on file  . Active Member of Clubs or Organizations: Not on file  . Attends Archivist Meetings: Not on file  . Marital Status: Not on file  Intimate Partner Violence:   . Fear of Current or Ex-Partner: Not on file  . Emotionally Abused: Not on file  . Physically Abused: Not on file  . Sexually Abused: Not on file    Past Surgical History:  Procedure Laterality Date  . APPENDECTOMY  age 67's  . CARDIAC CATHETERIZATION  05-08-2012  dr Elonda Husky at [facility]   no significant cad, sluggish ectatic coronary artery flow (rca, lad, lcx), normal LVSF, ef  60%  . HEMORROIDECTOMY  1985 ?   ??  . HYDROCELE EXCISION Left 09/10/2016   Procedure: HYDROCELECTOMY ADULT;  Surgeon: Kathie Rhodes, MD;  Location: [facility];  Service: Urology;  Laterality: Left;  . UMBILICAL HERNIA REPAIR  1980's    Family History  Problem Relation Age of Onset  . Coronary artery disease Father   . Diabetes Father   . Prostate cancer Father        died at 71  . Alcohol abuse Father   . Coronary artery disease Mother        died age 55  . Alcohol abuse Mother   . Cancer Mother        unsure, + thyroid cancer  . Breast cancer Paternal Grandmother     Allergies  Allergen Reactions  . Tanzeum [Albiglutide] Other (See Comments)    constipation    Current Outpatient Medications on File Prior to Visit  Medication Sig Dispense Refill  . atorvastatin (LIPITOR) 40 MG tablet Take 1 tablet (40 mg total) by mouth daily. 90  tablet 1  . clopidogrel (PLAVIX) 75 MG tablet TAKE 1 TABLET DAILY 90 tablet 3  . diltiazem (TIAZAC) 300 MG 24 hr capsule Take 1 capsule (300 mg total) by mouth every morning. 90 capsule 0  . famotidine (PEPCID) 20 MG tablet Take 1 tablet (20 mg total) by mouth daily as needed for heartburn or indigestion. 90 tablet 1  . hydrocortisone (ANUSOL-HC) 25 MG suppository Place 1 suppository (25 mg total) rectally 2 (two) times daily as needed for hemorrhoids. 12 suppository 0  . metFORMIN (GLUCOPHAGE) 1000 MG tablet TAKE 1 TABLET TWICE A DAY WITH MEALS ( THIS SHOULD REPLACE 500 MG TWICE A DAY ) 180 tablet 1  . Testosterone 1.62 % GEL Place onto the skin.    . valsartan-hydrochlorothiazide (DIOVAN-HCT) 160-25 MG tablet Take 1 tablet by mouth every evening. 90 tablet 1   No current facility-administered medications on file prior to visit.    BP (!) 151/86 (BP Location: Right Arm, Patient Position: Sitting, Cuff Size: Large)   Pulse 71   Temp (!) 97 F (36.1 C) (Temporal)   Resp 16   Ht 5\' 7"  (1.702 m)   Wt 278 lb (126.1 kg)   SpO2 100%   BMI 43.54 kg/m       Objective:   Physical Exam Constitutional:      General: He is not in acute distress.    Appearance: He is well-developed.  HENT:     Head: Normocephalic and atraumatic.  Cardiovascular:     Rate and Rhythm: Normal rate and regular rhythm.     Heart sounds: No murmur.  Pulmonary:     Effort: Pulmonary effort is normal. No respiratory distress.     Breath sounds: Normal breath sounds. No wheezing or rales.  Musculoskeletal:     Comments: + swelling of medial and lateral ankle. + tenderness medially  Skin:    General: Skin is warm and dry.  Neurological:     Mental Status: He is alert and oriented to person, place, and time.  Psychiatric:        Behavior: Behavior normal.        Thought Content: Thought content normal.           Assessment & Plan:  Right ankle pain- rule out fracture. Will obtain x-ray. Pt was given  crutches to help with walking.   This visit occurred during the SARS-CoV-2 public health emergency.  Safety protocols were in place, including screening questions prior to the visit, additional usage of staff PPE, and extensive cleaning of exam room while observing appropriate contact time as indicated for disinfecting solutions.

## 2020-01-23 ENCOUNTER — Telehealth: Payer: Self-pay | Admitting: Family

## 2020-01-23 DIAGNOSIS — S8264XA Nondisplaced fracture of lateral malleolus of right fibula, initial encounter for closed fracture: Secondary | ICD-10-CM | POA: Diagnosis not present

## 2020-01-23 NOTE — Telephone Encounter (Signed)
See my chart message

## 2020-01-25 DIAGNOSIS — S8264XD Nondisplaced fracture of lateral malleolus of right fibula, subsequent encounter for closed fracture with routine healing: Secondary | ICD-10-CM | POA: Diagnosis not present

## 2020-02-01 DIAGNOSIS — S8264XD Nondisplaced fracture of lateral malleolus of right fibula, subsequent encounter for closed fracture with routine healing: Secondary | ICD-10-CM | POA: Diagnosis not present

## 2020-02-02 ENCOUNTER — Ambulatory Visit: Payer: Medicare Other | Attending: Internal Medicine

## 2020-02-02 DIAGNOSIS — Z23 Encounter for immunization: Secondary | ICD-10-CM | POA: Insufficient documentation

## 2020-02-02 NOTE — Progress Notes (Signed)
   Covid-19 Vaccination Clinic  Name:  Steven Payne    MRN: [id number] DOB: 1946/09/06  02/02/2020  Steven Payne was observed post Covid-19 immunization for 15 minutes without incident. He was provided with Vaccine Information Sheet and instruction to access the V-Safe system.   Steven Payne was instructed to call 911 with any severe reactions post vaccine: Marland Kitchen Difficulty breathing  . Swelling of face and throat  . A fast heartbeat  . A bad rash all over body  . Dizziness and weakness   Immunizations Administered    Name Date Dose VIS Date Route   Pfizer COVID-19 Vaccine 02/02/2020  8:41 AM 0.3 mL 11/13/2019 Intramuscular   Manufacturer: [place]   Lot: [id number]   [place]: [id number]

## 2020-02-26 DIAGNOSIS — S8264XD Nondisplaced fracture of lateral malleolus of right fibula, subsequent encounter for closed fracture with routine healing: Secondary | ICD-10-CM | POA: Diagnosis not present

## 2020-04-04 DIAGNOSIS — S8264XD Nondisplaced fracture of lateral malleolus of right fibula, subsequent encounter for closed fracture with routine healing: Secondary | ICD-10-CM | POA: Diagnosis not present

## 2020-04-13 ENCOUNTER — Other Ambulatory Visit: Payer: Self-pay

## 2020-04-13 MED ORDER — DILTIAZEM HCL ER BEADS 300 MG PO CP24: 300.0000 mg | ORAL_CAPSULE | Freq: Every morning | ORAL | 0 refills | Status: DC

## 2020-05-04 DIAGNOSIS — S8264XD Nondisplaced fracture of lateral malleolus of right fibula, subsequent encounter for closed fracture with routine healing: Secondary | ICD-10-CM | POA: Diagnosis not present

## 2020-05-31 NOTE — Progress Notes (Signed)
Subjective:   Steven Payne is a 74 y.o. male who presents for Medicare Annual/Subsequent preventive examination.  Review of Systems     Cardiac Risk Factors include: advanced age (>20men, >57 women);diabetes mellitus;dyslipidemia;hypertension;obesity (BMI >30kg/m2);male gender;sedentary lifestyle     Objective:    Today's Vitals   06/01/20 1049  BP: 121/73  Pulse: 69  Temp: (!) 97 F (36.1 C)  TempSrc: Temporal  SpO2: 100%  Weight: 271 lb (122.9 kg)  Height: 5\' 7"  (1.702 m)   Body mass index is 42.44 kg/m.  Advanced Directives 06/01/2020 10/30/2017 11/22/2016 09/28/2016 09/10/2016 05/21/2016 03/26/2016  Does Patient Have a Medical Advance Directive? No No No No No No No  Would patient like information on creating a medical advance directive? No - Patient declined - No - Patient declined No - patient declined information Yes - Educational materials given Yes - Educational materials given No - patient declined information    Current Medications (verified) Outpatient Encounter Medications as of 06/01/2020  Medication Sig  . atorvastatin (LIPITOR) 40 MG tablet Take 1 tablet (40 mg total) by mouth daily.  . clopidogrel (PLAVIX) 75 MG tablet Take 1 tablet (75 mg total) by mouth daily.  . Continuous Blood Gluc Sensor (FREESTYLE LIBRE SENSOR SYSTEM) MISC Use as directed  . diltiazem (TIAZAC) 300 MG 24 hr capsule Take 1 capsule (300 mg total) by mouth every morning.  . famotidine (PEPCID) 20 MG tablet Take 1 tablet (20 mg total) by mouth daily as needed for heartburn or indigestion.  . hydrocortisone (ANUSOL-HC) 25 MG suppository Place 1 suppository (25 mg total) rectally 2 (two) times daily as needed for hemorrhoids.  . metFORMIN (GLUCOPHAGE) 1000 MG tablet TAKE 1 TABLET TWICE A DAY WITH MEALS ( THIS SHOULD REPLACE 500 MG TWICE A DAY )  . Testosterone 1.62 % GEL Place onto the skin.  . valsartan-hydrochlorothiazide (DIOVAN-HCT) 160-25 MG tablet Take 1 tablet by mouth every evening.    . [DISCONTINUED] atorvastatin (LIPITOR) 40 MG tablet Take 1 tablet (40 mg total) by mouth daily.  . [DISCONTINUED] clopidogrel (PLAVIX) 75 MG tablet TAKE 1 TABLET DAILY  . [DISCONTINUED] diltiazem (TIAZAC) 300 MG 24 hr capsule Take 1 capsule (300 mg total) by mouth every morning.  . [DISCONTINUED] famotidine (PEPCID) 20 MG tablet Take 1 tablet (20 mg total) by mouth daily as needed for heartburn or indigestion.  . [DISCONTINUED] metFORMIN (GLUCOPHAGE) 1000 MG tablet TAKE 1 TABLET TWICE A DAY WITH MEALS ( THIS SHOULD REPLACE 500 MG TWICE A DAY )  . [DISCONTINUED] valsartan-hydrochlorothiazide (DIOVAN-HCT) 160-25 MG tablet Take 1 tablet by mouth every evening.   No facility-administered encounter medications on file as of 06/01/2020.    Allergies (verified) Tanzeum [albiglutide]   History: Past Medical History:  Diagnosis Date  . Arthritis   . Coronary artery disease cardiologist-  dr Irish Lack   per cardiac cath 06/ 2013--  no sig. cad, lvef 60%, sluggish ectatic coronary artery flow of lad, lcx, rc  . ED (erectile dysfunction)   . GERD (gastroesophageal reflux disease)   . History of gout    approx 2002  . Hyperlipemia   . Hypertension   . Hypogonadism male   . Left hydrocele   . Type 2 diabetes mellitus ([place])    Past Surgical History:  Procedure Laterality Date  . APPENDECTOMY  age 67's  . CARDIAC CATHETERIZATION  05-08-2012  dr Elonda Husky at [facility]   no significant cad, sluggish ectatic coronary artery flow (rca, lad, lcx), normal LVSF, ef  60%  . HEMORROIDECTOMY  1985 ?   ??  . HYDROCELE EXCISION Left 09/10/2016   Procedure: HYDROCELECTOMY ADULT;  Surgeon: Kathie Rhodes, MD;  Location: [facility];  Service: Urology;  Laterality: Left;  . UMBILICAL HERNIA REPAIR  1980's   Family History  Problem Relation Age of Onset  . Coronary artery disease Father   . Diabetes Father   . Prostate cancer Father        died at 30  . Alcohol abuse Father   . Coronary artery  disease Mother        died age 67  . Alcohol abuse Mother   . Cancer Mother        unsure, + thyroid cancer  . Breast cancer Paternal Grandmother    Social History   Socioeconomic History  . Marital status: Married    Spouse name: Not on file  . Number of children: Not on file  . Years of education: Not on file  . Highest education level: Not on file  Occupational History  . Not on file  Tobacco Use  . Smoking status: Former Smoker    Years: 1.00    Quit date: 03/27/1991    Years since quitting: 29.2  . Smokeless tobacco: Never Used  Substance and Sexual Activity  . Alcohol use: No    Alcohol/week: 0.0 standard drinks  . Drug use: No  . Sexual activity: Not on file  Other Topics Concern  . 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  No grandchildren.  Oldest in [place], youngest in [place]   Completed bachelors, some graduate (Estate manager/land agent)   Enjoys travelling, walking, cruises   Social Determinants of Health   Financial Resource Strain: Joice   . Difficulty of Paying Living Expenses: Not hard at all  Food Insecurity: No Food Insecurity  . Worried About Charity fundraiser in the Last Year: Never true  . Ran Out of Food in the Last Year: Never true  Transportation Needs: No Transportation Needs  . Lack of Transportation (Medical): No  . Lack of Transportation (Non-Medical): No  Physical Activity:   . Days of Exercise per Week:   . Minutes of Exercise per Session:   Stress:   . Feeling of Stress :   Social Connections:   . Frequency of Communication with Friends and Family:   . Frequency of Social Gatherings with Friends and Family:   . Attends Religious Services:   . Active Member of Clubs or Organizations:   . Attends Archivist Meetings:   Marland Kitchen Marital Status:     Tobacco Counseling Counseling given:  Not Answered   Clinical Intake: Pain : No/denies pain    Activities of Daily Living In your present state of health, do you have any difficulty performing the following activities: 06/01/2020 09/16/2019  Hearing? N N  Vision? N N  Difficulty concentrating or making decisions? N N  Walking or climbing stairs? N N  Dressing or bathing? N N  Doing errands, shopping? N N  Preparing Food and eating ? N -  Using the Toilet? N -  In the past six months, have you accidently leaked urine? N -  Do you have problems with loss of bowel control? N -  Managing your Medications? N -  Managing your  Finances? N -  Housekeeping or managing your Housekeeping? N -  Some recent data might be hidden    Patient Care Team: [name], NP as PCP - General (Internal Medicine) Virgina Evener, OD as Referring Physician (Optometry)  Indicate any recent Medical Services you may have received from other than [facility] providers in the past year (date may be approximate).     Assessment:   This is a routine wellness examination for [place].  Dietary issues and exercise activities discussed: Current Exercise Habits: The patient does not participate in regular exercise at present, Exercise limited by: orthopedic condition(s) Diet (meal preparation, eat out, water intake, caffeinated beverages, dairy products, fruits and vegetables): well balanced   Goals    . Increase lean proteins     Decrease intake of fried foods    . Increase physical activity     Has membership at [employer].  Pt has a personal plan to workout 3-4 days per week for at least  150 minutes/week.       . Increase water intake    . Lose 50 lbs by 05/17/17      Depression Screen PHQ 2/9 Scores 06/01/2020 09/16/2019 09/08/2018 11/06/2016 05/21/2016 03/26/2016  PHQ - 2 Score 0 0 0 0 0 0    Fall Risk Fall Risk  06/01/2020 09/08/2018 07/15/2017 05/21/2016 03/26/2016  Falls in the past year? 1 No No No No  Number falls in past yr: 0 - -  - -  Injury with Fall? 1 - - - -  Follow up Education provided;Falls prevention discussed - - - -   Lives w/ wife in 1 story home.   Any stairs in or around the home? Yes  If so, are there any without handrails? No  Home free of loose throw rugs in walkways, pet beds, electrical cords, etc? Yes  Adequate lighting in your home to reduce risk of falls? Yes   ASSISTIVE DEVICES UTILIZED TO PREVENT FALLS:  Life alert? No  Use of a cane, walker or w/c? No  Grab bars in the bathroom? No  Shower chair or bench in shower? No  Elevated toilet seat or a handicapped toilet? No    Gait steady and fast without use of assistive device  Cognitive Function: Ad8 score reviewed for issues:  Issues making decisions:no  Less interest in hobbies / activities:no  Repeats questions, stories (family complaining):no  Trouble using ordinary gadgets (microwave, computer, phone):no  Forgets the month or year: no  Mismanaging finances: no  Remembering appts:no  Daily problems with thinking and/or memory:no Ad8 score is=0     MMSE - Mini Mental State Exam 03/26/2016  Orientation to time 5  Orientation to Place 5  Registration 3  Attention/ Calculation 5  Recall 2  Language- name 2 objects 2  Language- repeat 1  Language- follow 3 step command 3  Language- read & follow direction 1  Write a sentence 1  Copy design 1  Total score 29        Immunizations Immunization History  Administered Date(s) Administered  . Fluad Quad(high Dose 65+) 08/13/2019  . Influenza, High Dose Seasonal PF 09/04/2016, 08/23/2017, 09/08/2018  . PFIZER SARS-COV-2 Vaccination 01/08/2020, 02/02/2020  . Pneumococcal Conjugate-13 11/06/2016  . Pneumococcal Polysaccharide-23 11/23/2010, 09/08/2018  . Td 11/06/2016  . Zoster Recombinat (Shingrix) 07/15/2017, 10/29/2017    TDAP status: Up to date Flu Vaccine status: Up to date Pneumococcal vaccine status: Up to date Covid-19 vaccine status: Completed  vaccines  Qualifies for  Shingles Vaccine? Yes   Shingrix Completed?: Yes  Screening Tests Health Maintenance  Topic Date Due  . OPHTHALMOLOGY EXAM  11/15/2017  . INFLUENZA VACCINE  07/03/2020  . HEMOGLOBIN A1C  07/04/2020  . FOOT EXAM  06/01/2021  . TETANUS/TDAP  11/06/2026  . COLONOSCOPY  07/06/2027  . COVID-19 Vaccine  Completed  . Hepatitis C Screening  Completed  . PNA vac Low Risk Adult  Completed    Health Maintenance  Health Maintenance Due  Topic Date Due  . OPHTHALMOLOGY EXAM  11/15/2017    Colorectal cancer screening: Completed 07/05/17. Repeat every 10 years  Lung Cancer Screening: (Low Dose CT Chest recommended if Age 41-80 years, 30 pack-year currently smoking OR have quit w/in 15years.) does not qualify.     Additional Screening:  Hepatitis C Screening: does qualify; Completed 03/11/17   Vision Screening: Recommended annual ophthalmology exams for early detection of glaucoma and other disorders of the eye. Is the patient up to date with their annual eye exam?  Yes  Who is the provider or what is the name of the office in which the patient attends annual eye exams? Dr.Digby and Dr.Patel   Dental Screening: Recommended annual dental exams for proper oral hygiene  Community Resource Referral / Chronic Care Management: CRR required this visit?  No   CCM required this visit?  No      Plan:    Please schedule your next medicare wellness visit with me in 1 yr.  Continue to eat heart healthy diet (full of fruits, vegetables, whole grains, lean protein, water--limit salt, fat, and sugar intake) and increase physical activity as tolerated.  Continue doing brain stimulating activities (puzzles, reading, adult coloring books, staying active) to keep memory sharp.     I have personally reviewed and noted the following in the patient's chart:   . Medical and social history . Use of alcohol, tobacco or illicit drugs  . Current medications and  supplements . Functional ability and status . Nutritional status . Physical activity . Advanced directives . List of other physicians . Hospitalizations, surgeries, and ER visits in previous 12 months . Vitals . Screenings to include cognitive, depression, and falls . Referrals and appointments  In addition, I have reviewed and discussed with patient certain preventive protocols, quality metrics, and best practice recommendations. A written personalized care plan for preventive services as well as general preventive health recommendations were provided to patient.     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

## 2020-06-01 ENCOUNTER — Encounter: Payer: Self-pay | Admitting: Family

## 2020-06-01 ENCOUNTER — Telehealth: Payer: Self-pay | Admitting: Family

## 2020-06-01 ENCOUNTER — Other Ambulatory Visit: Payer: Self-pay

## 2020-06-01 ENCOUNTER — Ambulatory Visit (INDEPENDENT_AMBULATORY_CARE_PROVIDER_SITE_OTHER): Payer: Medicare Other | Admitting: Family

## 2020-06-01 ENCOUNTER — Encounter: Payer: Self-pay | Admitting: *Deleted

## 2020-06-01 ENCOUNTER — Ambulatory Visit (INDEPENDENT_AMBULATORY_CARE_PROVIDER_SITE_OTHER): Payer: Medicare Other | Admitting: *Deleted

## 2020-06-01 VITALS — BP 121/73 | HR 69 | Temp 97.0°F | Ht 67.0 in | Wt 271.0 lb

## 2020-06-01 VITALS — BP 121/73 | HR 69 | Temp 97.0°F | Resp 16 | Ht 67.0 in | Wt 271.0 lb

## 2020-06-01 DIAGNOSIS — E119 Type 2 diabetes mellitus without complications: Secondary | ICD-10-CM | POA: Diagnosis not present

## 2020-06-01 DIAGNOSIS — E782 Mixed hyperlipidemia: Secondary | ICD-10-CM

## 2020-06-01 DIAGNOSIS — K219 Gastro-esophageal reflux disease without esophagitis: Secondary | ICD-10-CM | POA: Diagnosis not present

## 2020-06-01 DIAGNOSIS — I2510 Atherosclerotic heart disease of native coronary artery without angina pectoris: Secondary | ICD-10-CM

## 2020-06-01 DIAGNOSIS — I7789 Other specified disorders of arteries and arterioles: Secondary | ICD-10-CM

## 2020-06-01 DIAGNOSIS — Z0000 Encounter for general adult medical examination without abnormal findings: Secondary | ICD-10-CM

## 2020-06-01 DIAGNOSIS — I10 Essential (primary) hypertension: Secondary | ICD-10-CM

## 2020-06-01 LAB — COMPREHENSIVE METABOLIC PANEL
ALT: 12 U/L (ref 0–53)
AST: 16 U/L (ref 0–37)
Albumin: 4.5 g/dL (ref 3.5–5.2)
Alkaline Phosphatase: 79 U/L (ref 39–117)
BUN: 12 mg/dL (ref 6–23)
CO2: 26 mEq/L (ref 19–32)
Calcium: 9.4 mg/dL (ref 8.4–10.5)
Chloride: 100 mEq/L (ref 96–112)
Creatinine, Ser: 0.84 mg/dL (ref 0.40–1.50)
GFR: 107.61 mL/min (ref >60.00)
Glucose, Bld: 87 mg/dL (ref 70–99)
Potassium: 3.8 mEq/L (ref 3.5–5.1)
Sodium: 137 mEq/L (ref 135–145)
Total Bilirubin: 0.6 mg/dL (ref 0.2–1.2)
Total Protein: 7.2 g/dL (ref 6.0–8.3)

## 2020-06-01 LAB — HEMOGLOBIN A1C: Hgb A1c MFr Bld: 6.3 % (ref 4.6–6.5)

## 2020-06-01 MED ORDER — FAMOTIDINE 20 MG PO TABS: 20.0000 mg | ORAL_TABLET | Freq: Every day | ORAL | 1 refills | Status: DC | PRN

## 2020-06-01 MED ORDER — ATORVASTATIN CALCIUM 40 MG PO TABS: 40.0000 mg | ORAL_TABLET | Freq: Every day | ORAL | 1 refills | Status: DC

## 2020-06-01 MED ORDER — FREESTYLE LIBRE SENSOR SYSTEM MISC: 0 refills | Status: DC

## 2020-06-01 MED ORDER — CLOPIDOGREL BISULFATE 75 MG PO TABS: 75.0000 mg | ORAL_TABLET | Freq: Every day | ORAL | 1 refills | Status: DC

## 2020-06-01 MED ORDER — VALSARTAN-HYDROCHLOROTHIAZIDE 160-25 MG PO TABS: 1.0000 | ORAL_TABLET | Freq: Every evening | ORAL | 1 refills | Status: DC

## 2020-06-01 MED ORDER — METFORMIN HCL 1000 MG PO TABS: ORAL_TABLET | ORAL | 1 refills | Status: DC

## 2020-06-01 MED ORDER — DILTIAZEM HCL ER BEADS 300 MG PO CP24: 300.0000 mg | ORAL_CAPSULE | Freq: Every morning | ORAL | 1 refills | Status: DC

## 2020-06-01 NOTE — Patient Instructions (Addendum)
Please schedule follow up visit with Dr. Geraldo Pitter (cardiology) [phone]. Please call [facility] to schedule a follow up.

## 2020-06-01 NOTE — Progress Notes (Signed)
Subjective:    Patient ID: Steven Payne, male    DOB: Jul 13, 1946, 74 y.o.   MRN: [id number]  HPI  Patient is a 74 yr old male who presents today for follow up.  DM2- maintained on metformin.   Lab Results  Component Value Date   HGBA1C 6.7 (H) 01/05/2020   HGBA1C 6.5 12/22/2018   HGBA1C 6.9 (H) 09/08/2018   Lab Results  Component Value Date   MICROALBUR 1.1 03/11/2017   LDLCALC 91 01/05/2020   CREATININE 0.83 01/05/2020   HTN-  BP Readings from Last 3 Encounters:  06/01/20 121/73  01/22/20 (!) 151/86  01/01/20 136/81   Followed by urology- [facility] for hypogonadism.   CAD- due for follow up. Takes plavix daily for 5 days.   GERD- stable with PRN pepcid.   Hyperlipidemia- maintained on lipitor.   Lab Results  Component Value Date   CHOL 161 01/05/2020   HDL 36.30 (L) 01/05/2020   LDLCALC 91 01/05/2020   TRIG 169.0 (H) 01/05/2020   CHOLHDL 4 01/05/2020   Fracture- back in February he had a distal oblique fibular fracture. We referred him to orthopedics and underwent surgery for his fracture. States he is feeling better.    Review of Systems    see HPI  Past Medical History:  Diagnosis Date  . Arthritis   . Coronary artery disease cardiologist-  dr Irish Lack   per cardiac cath 06/ 2013--  no sig. cad, lvef 60%, sluggish ectatic coronary artery flow of lad, lcx, rc  . ED (erectile dysfunction)   . GERD (gastroesophageal reflux disease)   . History of gout    approx 2002  . Hyperlipemia   . Hypertension   . Hypogonadism male   . Left hydrocele   . Type 2 diabetes mellitus ([place])      Social History   Socioeconomic History  . Marital status: Married    Spouse name: Not on file  . Number of children: Not on file  . Years of education: Not on file  . Highest education level: Not on file  Occupational History  . Not on file  Tobacco Use  . Smoking status: Former Smoker    Years: 1.00    Quit date: 03/27/1991    Years since quitting: 29.2  .  Smokeless tobacco: Never Used  Substance and Sexual Activity  . Alcohol use: No    Alcohol/week: 0.0 standard drinks  . Drug use: No  . Sexual activity: Not on file  Other Topics Concern  . 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  No grandchildren.  Oldest in [place], youngest in [place]   Completed bachelors, some graduate (Estate manager/land agent)   Enjoys travelling, walking, cruises   Social Determinants of Radio broadcast assistant Strain:   . Difficulty of Paying Living Expenses:   Food Insecurity:   . Worried About Charity fundraiser in the Last Year:   . Arboriculturist in the Last Year:   Transportation Needs:   . Film/video editor (Medical):   Marland Kitchen Lack of Transportation (Non-Medical):   Physical Activity:   . Days of Exercise per Week:   . Minutes of Exercise per Session:   Stress:   . Feeling of Stress :   Social Connections:   . Frequency of  Communication with Friends and Family:   . Frequency of Social Gatherings with Friends and Family:   . Attends Religious Services:   . Active Member of Clubs or Organizations:   . Attends Archivist Meetings:   Marland Kitchen Marital Status:   Intimate Partner Violence:   . Fear of Current or Ex-Partner:   . Emotionally Abused:   Marland Kitchen Physically Abused:   . Sexually Abused:     Past Surgical History:  Procedure Laterality Date  . APPENDECTOMY  age 30's  . CARDIAC CATHETERIZATION  05-08-2012  dr Elonda Husky at [facility]   no significant cad, sluggish ectatic coronary artery flow (rca, lad, lcx), normal LVSF, ef 60%  . HEMORROIDECTOMY  1985 ?   ??  . HYDROCELE EXCISION Left 09/10/2016   Procedure: HYDROCELECTOMY ADULT;  Surgeon: Kathie Rhodes, MD;  Location: [facility];  Service: Urology;  Laterality: Left;  . UMBILICAL HERNIA REPAIR  1980's    Family History  Problem Relation  Age of Onset  . Coronary artery disease Father   . Diabetes Father   . Prostate cancer Father        died at 9  . Alcohol abuse Father   . Coronary artery disease Mother        died age 41  . Alcohol abuse Mother   . Cancer Mother        unsure, + thyroid cancer  . Breast cancer Paternal Grandmother     Allergies  Allergen Reactions  . Tanzeum [Albiglutide] Other (See Comments)    constipation    Current Outpatient Medications on File Prior to Visit  Medication Sig Dispense Refill  . atorvastatin (LIPITOR) 40 MG tablet Take 1 tablet (40 mg total) by mouth daily. 90 tablet 1  . clopidogrel (PLAVIX) 75 MG tablet TAKE 1 TABLET DAILY 90 tablet 3  . diltiazem (TIAZAC) 300 MG 24 hr capsule Take 1 capsule (300 mg total) by mouth every morning. 90 capsule 0  . famotidine (PEPCID) 20 MG tablet Take 1 tablet (20 mg total) by mouth daily as needed for heartburn or indigestion. 90 tablet 1  . hydrocortisone (ANUSOL-HC) 25 MG suppository Place 1 suppository (25 mg total) rectally 2 (two) times daily as needed for hemorrhoids. 12 suppository 0  . metFORMIN (GLUCOPHAGE) 1000 MG tablet TAKE 1 TABLET TWICE A DAY WITH MEALS ( THIS SHOULD REPLACE 500 MG TWICE A DAY ) 180 tablet 1  . Testosterone 1.62 % GEL Place onto the skin.    . valsartan-hydrochlorothiazide (DIOVAN-HCT) 160-25 MG tablet Take 1 tablet by mouth every evening. 90 tablet 1   No current facility-administered medications on file prior to visit.    BP 121/73 (BP Location: Right Arm, Patient Position: Sitting, Cuff Size: Large)   Pulse 69   Temp (!) 97 F (36.1 C) (Temporal)   Resp 16   Ht 5\' 7"  (1.702 m)   Wt 271 lb (122.9 kg)   SpO2 100%   BMI 42.44 kg/m    Objective:   Physical Exam Constitutional:      General: He is not in acute distress.    Appearance: He is well-developed.  HENT:     Head: Normocephalic and atraumatic.  Cardiovascular:     Rate and Rhythm: Normal rate and regular rhythm.     Heart sounds: No  murmur heard.   Pulmonary:     Effort: Pulmonary effort is normal. No respiratory distress.     Breath sounds: Normal breath sounds. No wheezing  or rales.  Musculoskeletal:     Comments: Mild right ankle fracture  Skin:    General: Skin is warm and dry.  Neurological:     Mental Status: He is alert and oriented to person, place, and time.  Psychiatric:        Behavior: Behavior normal.        Thought Content: Thought content normal.           Assessment & Plan:  HTN- BP stable on diltiazem and diovan HCT.  Continue same.  DM2- clinically stable. Pt is interested in freestyle [place] monitor. [facility]. Obtain A1C.   CAD- clinically stable. Maintained on plavix. Advised pt to schedule follow up with cardiology.  Foot fracture- clinically resolved- has been followed by [facility].   GERD- stable on prn pepcid.  LIpid panel- LDL at goal. Continue statin.  Hypogonadism- maintained on testosterone per urology. Pt will schedule a follow up appointment with them.  This visit occurred during the SARS-CoV-2 public health emergency.  Safety protocols were in place, including screening questions prior to the visit, additional usage of staff PPE, and extensive cleaning of exam room while observing appropriate contact time as indicated for disinfecting solutions.

## 2020-06-01 NOTE — Patient Instructions (Signed)
Please schedule your next medicare wellness visit with me in 1 yr.  Continue to eat heart healthy diet (full of fruits, vegetables, whole grains, lean protein, water--limit salt, fat, and sugar intake) and increase physical activity as tolerated.  Continue doing brain stimulating activities (puzzles, reading, adult coloring books, staying active) to keep memory sharp.    Steven Payne , Thank you for taking time to come for your Medicare Wellness Visit. I appreciate your ongoing commitment to your health goals. Please review the following plan we discussed and let me know if I can assist you in the future.   These are the goals we discussed: Goals    . Increase lean proteins     Decrease intake of fried foods    . Increase physical activity     Has membership at [employer].  Pt has a personal plan to workout 3-4 days per week for at least  150 minutes/week.       . Increase water intake    . Lose 50 lbs by 05/17/17       This is a list of the screening recommended for you and due dates:  Health Maintenance  Topic Date Due  . Eye exam for diabetics  11/15/2017  . Flu Shot  07/03/2020  . Hemoglobin A1C  07/04/2020  . Complete foot exam   06/01/2021  . Tetanus Vaccine  11/06/2026  . Colon Cancer Screening  07/06/2027  . COVID-19 Vaccine  Completed  .  Hepatitis C: One time screening is recommended by Center for Disease Control  (CDC) for  adults born from [birth year] through 1967.   Completed  . Pneumonia vaccines  Completed    Preventive Care 10 Years and Older, Male Preventive care refers to lifestyle choices and visits with your health care provider that can promote health and wellness. This includes:  A yearly physical exam. This is also called an annual well check.  Regular dental and eye exams.  Immunizations.  Screening for certain conditions.  Healthy lifestyle choices, such as diet and exercise. What can I expect for my preventive care visit? Physical exam Your  health care provider will check:  Height and weight. These may be used to calculate body mass index (BMI), which is a measurement that tells if you are at a healthy weight.  Heart rate and blood pressure.  Your skin for abnormal spots. Counseling Your health care provider may ask you questions about:  Alcohol, tobacco, and drug use.  Emotional well-being.  Home and relationship well-being.  Sexual activity.  Eating habits.  History of falls.  Memory and ability to understand (cognition).  Work and work Astronomer. What immunizations do I need?  Influenza (flu) vaccine  This is recommended every year. Tetanus, diphtheria, and pertussis (Tdap) vaccine  You may need a Td booster every 10 years. Varicella (chickenpox) vaccine  You may need this vaccine if you have not already been vaccinated. Zoster (shingles) vaccine  You may need this after age 65. Pneumococcal conjugate (PCV13) vaccine  One dose is recommended after age 64. Pneumococcal polysaccharide (PPSV23) vaccine  One dose is recommended after age 26. Measles, mumps, and rubella (MMR) vaccine  You may need at least one dose of MMR if you were born in 1957 or later. You may also need a second dose. Meningococcal conjugate (MenACWY) vaccine  You may need this if you have certain conditions. Hepatitis A vaccine  You may need this if you have certain conditions or if you travel  or work in places where you may be exposed to hepatitis A. Hepatitis B vaccine  You may need this if you have certain conditions or if you travel or work in places where you may be exposed to hepatitis B. Haemophilus influenzae type b (Hib) vaccine  You may need this if you have certain conditions. You may receive vaccines as individual doses or as more than one vaccine together in one shot (combination vaccines). Talk with your health care provider about the risks and benefits of combination vaccines. What tests do I need? Blood  tests  Lipid and cholesterol levels. These may be checked every 5 years, or more frequently depending on your overall health.  Hepatitis C test.  Hepatitis B test. Screening  Lung cancer screening. You may have this screening every year starting at age 57 if you have a 30-pack-year history of smoking and currently smoke or have quit within the past 15 years.  Colorectal cancer screening. All adults should have this screening starting at age 44 and continuing until age 27. Your health care provider may recommend screening at age 90 if you are at increased risk. You will have tests every 1-10 years, depending on your results and the type of screening test.  Prostate cancer screening. Recommendations will vary depending on your family history and other risks.  Diabetes screening. This is done by checking your blood sugar (glucose) after you have not eaten for a while (fasting). You may have this done every 1-3 years.  Abdominal aortic aneurysm (AAA) screening. You may need this if you are a current or former smoker.  Sexually transmitted disease (STD) testing. Follow these instructions at home: Eating and drinking  Eat a diet that includes fresh fruits and vegetables, whole grains, lean protein, and low-fat dairy products. Limit your intake of foods with high amounts of sugar, saturated fats, and salt.  Take vitamin and mineral supplements as recommended by your health care provider.  Do not drink alcohol if your health care provider tells you not to drink.  If you drink alcohol: ? Limit how much you have to 0-2 drinks a day. ? Be aware of how much alcohol is in your drink. In the [place] one drink equals one 12 oz bottle of beer (355 mL), one 5 oz glass of wine (148 mL), or one 1 oz glass of hard liquor (44 mL). Lifestyle  Take daily care of your teeth and gums.  Stay active. Exercise for at least 30 minutes on 5 or more days each week.  Do not use any products that contain  nicotine or tobacco, such as cigarettes, e-cigarettes, and chewing tobacco. If you need help quitting, ask your health care provider.  If you are sexually active, practice safe sex. Use a condom or other form of protection to prevent STIs (sexually transmitted infections).  Talk with your health care provider about taking a low-dose aspirin or statin. What's next?  Visit your health care provider once a year for a well check visit.  Ask your health care provider how often you should have your eyes and teeth checked.  Stay up to date on all vaccines. This information is not intended to replace advice given to you by your health care provider. Make sure you discuss any questions you have with your health care provider. Document Revised: 11/13/2018 Document Reviewed: 11/13/2018 Elsevier Patient Education  2020 [employer].

## 2020-06-01 NOTE — Telephone Encounter (Signed)
Please request DM eye exam from Dr. Posey Pronto.  [phone]

## 2020-06-07 NOTE — Telephone Encounter (Signed)
Records release faxed to [facility].

## 2020-06-20 DIAGNOSIS — R3912 Poor urinary stream: Secondary | ICD-10-CM | POA: Diagnosis not present

## 2020-06-20 DIAGNOSIS — N5201 Erectile dysfunction due to arterial insufficiency: Secondary | ICD-10-CM | POA: Diagnosis not present

## 2020-06-20 DIAGNOSIS — N401 Enlarged prostate with lower urinary tract symptoms: Secondary | ICD-10-CM | POA: Diagnosis not present

## 2020-06-20 DIAGNOSIS — E291 Testicular hypofunction: Secondary | ICD-10-CM | POA: Diagnosis not present

## 2020-06-20 DIAGNOSIS — R948 Abnormal results of function studies of other organs and systems: Secondary | ICD-10-CM | POA: Diagnosis not present

## 2020-09-02 ENCOUNTER — Other Ambulatory Visit (HOSPITAL_BASED_OUTPATIENT_CLINIC_OR_DEPARTMENT_OTHER): Payer: Self-pay | Admitting: Internal Medicine

## 2020-09-05 ENCOUNTER — Ambulatory Visit (INDEPENDENT_AMBULATORY_CARE_PROVIDER_SITE_OTHER): Payer: Medicare Other | Admitting: Family

## 2020-09-05 ENCOUNTER — Other Ambulatory Visit: Payer: Self-pay

## 2020-09-05 VITALS — BP 142/77 | HR 75 | Temp 98.6°F | Resp 16 | Ht 66.0 in | Wt 272.0 lb

## 2020-09-05 DIAGNOSIS — B353 Tinea pedis: Secondary | ICD-10-CM | POA: Diagnosis not present

## 2020-09-05 DIAGNOSIS — G4733 Obstructive sleep apnea (adult) (pediatric): Secondary | ICD-10-CM

## 2020-09-05 DIAGNOSIS — I2510 Atherosclerotic heart disease of native coronary artery without angina pectoris: Secondary | ICD-10-CM

## 2020-09-05 DIAGNOSIS — M109 Gout, unspecified: Secondary | ICD-10-CM | POA: Diagnosis not present

## 2020-09-05 MED ORDER — FLUCONAZOLE 150 MG PO TABS
ORAL_TABLET | ORAL | 0 refills | Status: DC
Start: 2020-09-05 — End: 2020-10-03

## 2020-09-05 MED ORDER — COLCHICINE 0.6 MG PO TABS
ORAL_TABLET | ORAL | 0 refills | Status: DC
Start: 2020-09-05 — End: 2020-12-26

## 2020-09-05 NOTE — Progress Notes (Signed)
Subjective:    Patient ID: Steven Payne, male    DOB: Apr 13, 1946, 74 y.o.   MRN: [id number]  HPI   Patient is a 74 yr old male who presents today for follow up.   Pain at the base of the left great toe. Began 1 week ago.  Then got worse last Friday.    He also reports a rash on his right foot and in between his toes.  He has tried multiple over-the-counter sprays and creams for athlete's foot without improvement.  Review of Systems    see HPI  Past Medical History:  Diagnosis Date  . Arthritis   . Coronary artery disease cardiologist-  dr Irish Lack   per cardiac cath 06/ 2013--  no sig. cad, lvef 60%, sluggish ectatic coronary artery flow of lad, lcx, rc  . ED (erectile dysfunction)   . GERD (gastroesophageal reflux disease)   . History of gout    approx 2002  . Hyperlipemia   . Hypertension   . Hypogonadism male   . Left hydrocele   . Type 2 diabetes mellitus ([place])      Social History   Socioeconomic History  . Marital status: Married    Spouse name: Not on file  . Number of children: Not on file  . Years of education: Not on file  . Highest education level: Not on file  Occupational History  . Not on file  Tobacco Use  . Smoking status: Former Smoker    Years: 1.00    Quit date: 03/27/1991    Years since quitting: 29.4  . Smokeless tobacco: Never Used  Substance and Sexual Activity  . Alcohol use: No    Alcohol/week: 0.0 standard drinks  . Drug use: No  . Sexual activity: Not on file  Other Topics Concern  . 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  No grandchildren.  Oldest in [place], youngest in [place]   Completed bachelors, some graduate (Estate manager/land agent)   Enjoys travelling, walking, cruises   Social Determinants of Health   Financial Resource Strain: [place]   . Difficulty of Paying Living  Expenses: Not hard at all  Food Insecurity: No Food Insecurity  . Worried About Charity fundraiser in the Last Year: Never true  . Ran Out of Food in the Last Year: Never true  Transportation Needs: No Transportation Needs  . Lack of Transportation (Medical): No  . Lack of Transportation (Non-Medical): No  Physical Activity:   . Days of Exercise per Week: Not on file  . Minutes of Exercise per Session: Not on file  Stress:   . Feeling of Stress : Not on file  Social Connections:   . Frequency of Communication with Friends and Family: Not on file  . Frequency of Social Gatherings with Friends and Family: Not on file  . Attends Religious Services: Not on file  . Active Member of Clubs or Organizations: Not on file  . Attends Archivist Meetings: Not on file  . Marital Status: Not on file  Intimate Partner Violence:   . Fear of Current or Ex-Partner: Not on file  . Emotionally Abused: Not on file  . Physically Abused: Not on file  . Sexually Abused: Not on file    Past Surgical History:  Procedure Laterality Date  .  APPENDECTOMY  age 74's  . CARDIAC CATHETERIZATION  05-08-2012  dr Elonda Husky at [facility]   no significant cad, sluggish ectatic coronary artery flow (rca, lad, lcx), normal LVSF, ef 60%  . HEMORROIDECTOMY  1985 ?   ??  . HYDROCELE EXCISION Left 09/10/2016   Procedure: HYDROCELECTOMY ADULT;  Surgeon: Kathie Rhodes, MD;  Location: [facility];  Service: Urology;  Laterality: Left;  . UMBILICAL HERNIA REPAIR  1980's    Family History  Problem Relation Age of Onset  . Coronary artery disease Father   . Diabetes Father   . Prostate cancer Father        died at 65  . Alcohol abuse Father   . Coronary artery disease Mother        died age 28  . Alcohol abuse Mother   . Cancer Mother        unsure, + thyroid cancer  . Breast cancer Paternal Grandmother     Allergies  Allergen Reactions  . Tanzeum [Albiglutide] Other (See Comments)    constipation     Current Outpatient Medications on File Prior to Visit  Medication Sig Dispense Refill  . atorvastatin (LIPITOR) 40 MG tablet Take 1 tablet (40 mg total) by mouth daily. 90 tablet 1  . clopidogrel (PLAVIX) 75 MG tablet Take 1 tablet (75 mg total) by mouth daily. 90 tablet 1  . Continuous Blood Gluc Sensor (FREESTYLE LIBRE SENSOR SYSTEM) MISC Use as directed 1 each 0  . diltiazem (TIAZAC) 300 MG 24 hr capsule Take 1 capsule (300 mg total) by mouth every morning. 90 capsule 1  . famotidine (PEPCID) 20 MG tablet Take 1 tablet (20 mg total) by mouth daily as needed for heartburn or indigestion. 90 tablet 1  . hydrocortisone (ANUSOL-HC) 25 MG suppository Place 1 suppository (25 mg total) rectally 2 (two) times daily as needed for hemorrhoids. 12 suppository 0  . metFORMIN (GLUCOPHAGE) 1000 MG tablet TAKE 1 TABLET TWICE A DAY WITH MEALS ( THIS SHOULD REPLACE 500 MG TWICE A DAY ) 180 tablet 1  . Testosterone 1.62 % GEL Place onto the skin.    . valsartan-hydrochlorothiazide (DIOVAN-HCT) 160-25 MG tablet Take 1 tablet by mouth every evening. 90 tablet 1   No current facility-administered medications on file prior to visit.    BP (!) 142/77 (BP Location: Right Arm, Patient Position: Sitting, Cuff Size: Large)   Pulse 75   Temp 98.6 F (37 C) (Oral)   Resp 16   Ht 5\' 6"  (1.676 m)   Wt 272 lb (123.4 kg)   SpO2 100%   BMI 43.90 kg/m    Objective:   Physical Exam Constitutional:      Appearance: Normal appearance.  Musculoskeletal:     Comments: Some swelling noted at the base of the left great toe  Skin:    Comments: Dry scaly rash noted on dorsal right foot near toes and between toes  Neurological:     Mental Status: He is alert.  Psychiatric:        Mood and Affect: Mood normal.        Behavior: Behavior normal.           Assessment & Plan:  Gout-advised patient on trial of colchicine 0.6 mg tabs.  Take 2 tablets by mouth now and 1 tablet 1 hour later.  He is advised that  he may repeat this regimen tomorrow if symptoms are not improved.  He was also given handout on gout diet.  Tinea pedis-has failed over-the-counter treatments.  Will give trial of fluconazole.  He is advised to wait a few days before starting the fluconazole due to potential drug interaction with colchicine.   Obstructive sleep apnea-he is now ready to proceed with referral to sleep specialist.  Referral has been replaced.    This visit occurred during the SARS-CoV-2 public health emergency.  Safety protocols were in place, including screening questions prior to the visit, additional usage of staff PPE, and extensive cleaning of exam room while observing appropriate contact time as indicated for disinfecting solutions.

## 2020-09-05 NOTE — Patient Instructions (Signed)
Please begin colchicine today. You can begin diflucan in a few days.  Call if symptoms worsen or if symptoms fail to improve.    Low-Purine Eating Plan A low-purine eating plan involves making food choices to limit your intake of purine. Purine is a kind of uric acid. Too much uric acid in your blood can cause certain conditions, such as gout and kidney stones. Eating a low-purine diet can help control these conditions. What are tips for following this plan? Reading food labels   Avoid foods with saturated or Trans fat.  Check the ingredient list of grains-based foods, such as bread and cereal, to make sure that they contain whole grains.  Check the ingredient list of sauces or soups to make sure they do not contain meat or fish.  When choosing soft drinks, check the ingredient list to make sure they do not contain high-fructose corn syrup. Shopping  Buy plenty of fresh fruits and vegetables.  Avoid buying canned or fresh fish.  Buy dairy products labeled as low-fat or nonfat.  Avoid buying premade or processed foods. These foods are often high in fat, salt (sodium), and added sugar. Cooking  Use olive oil instead of butter when cooking. Oils like olive oil, canola oil, and sunflower oil contain healthy fats. Meal planning  Learn which foods do or do not affect you. If you find out that a food tends to cause your gout symptoms to flare up, avoid eating that food. You can enjoy foods that do not cause problems. If you have any questions about a food item, talk with your dietitian or health care provider.  Limit foods high in fat, especially saturated fat. Fat makes it harder for your body to get rid of uric acid.  Choose foods that are lower in fat and are lean sources of protein. General guidelines  Limit alcohol intake to no more than 1 drink a day for nonpregnant women and 2 drinks a day for men. One drink equals 12 oz of beer, 5 oz of wine, or 1 oz of hard liquor. Alcohol  can affect the way your body gets rid of uric acid.  Drink plenty of water to keep your urine clear or pale yellow. Fluids can help remove uric acid from your body.  If directed by your health care provider, take a vitamin C supplement.  Work with your health care provider and dietitian to develop a plan to achieve or maintain a healthy weight. Losing weight can help reduce uric acid in your blood. What foods are recommended? The items listed may not be a complete list. Talk with your dietitian about what dietary choices are best for you. Foods low in purines Foods low in purines do not need to be limited. These include:  All fruits.  All low-purine vegetables, pickles, and olives.  Breads, pasta, rice, cornbread, and popcorn. Cake and other baked goods.  All dairy foods.  Eggs, nuts, and nut butters.  Spices and condiments, such as salt, herbs, and vinegar.  Plant oils, butter, and margarine.  Water, sugar-free soft drinks, tea, coffee, and cocoa.  Vegetable-based soups, broths, sauces, and gravies. Foods moderate in purines Foods moderate in purines should be limited to the amounts listed.   cup of asparagus, cauliflower, spinach, mushrooms, or green peas, each day.  2/3 cup uncooked oatmeal, each day.   cup dry wheat bran or wheat germ, each day.  2-3 ounces of meat or poultry, each day.  4-6 ounces of shellfish, such as  crab, lobster, oysters, or shrimp, each day.  1 cup cooked beans, peas, or lentils, each day.  Soup, broths, or bouillon made from meat or fish. Limit these foods as much as possible. What foods are not recommended? The items listed may not be a complete list. Talk with your dietitian about what dietary choices are best for you. Limit your intake of foods high in purines, including:  Beer and other alcohol.  Meat-based gravy or sauce.  Canned or fresh fish, such as: ? Anchovies, sardines, herring, and tuna. ? Mussels and  scallops. ? Codfish, trout, and haddock.  Berniece Salines.  Organ meats, such as: ? Liver or kidney. ? Tripe. ? Sweetbreads (thymus gland or pancreas).  Wild Clinical biochemist.  Yeast or yeast extract supplements.  Drinks sweetened with high-fructose corn syrup. Summary  Eating a low-purine diet can help control conditions caused by too much uric acid in the body, such as gout or kidney stones.  Choose low-purine foods, limit alcohol, and limit foods high in fat.  You will learn over time which foods do or do not affect you. If you find out that a food tends to cause your gout symptoms to flare up, avoid eating that food. This information is not intended to replace advice given to you by your health care provider. Make sure you discuss any questions you have with your health care provider. Document Revised: 11/01/2017 Document Reviewed: 01/02/2017 Elsevier Patient Education  2020 [employer].

## 2020-09-19 DIAGNOSIS — D485 Neoplasm of uncertain behavior of skin: Secondary | ICD-10-CM | POA: Diagnosis not present

## 2020-09-19 DIAGNOSIS — I872 Venous insufficiency (chronic) (peripheral): Secondary | ICD-10-CM | POA: Diagnosis not present

## 2020-09-19 DIAGNOSIS — B353 Tinea pedis: Secondary | ICD-10-CM | POA: Diagnosis not present

## 2020-09-22 DIAGNOSIS — D239 Other benign neoplasm of skin, unspecified: Secondary | ICD-10-CM | POA: Diagnosis not present

## 2020-09-27 ENCOUNTER — Encounter: Payer: Self-pay | Admitting: Family

## 2020-10-03 ENCOUNTER — Other Ambulatory Visit: Payer: Self-pay

## 2020-10-03 ENCOUNTER — Encounter: Payer: Self-pay | Admitting: Family

## 2020-10-03 ENCOUNTER — Other Ambulatory Visit: Payer: Self-pay | Admitting: Family

## 2020-10-03 ENCOUNTER — Ambulatory Visit (INDEPENDENT_AMBULATORY_CARE_PROVIDER_SITE_OTHER): Payer: Medicare Other | Admitting: Family

## 2020-10-03 VITALS — BP 128/82 | HR 66 | Resp 18 | Ht 66.0 in | Wt 268.0 lb

## 2020-10-03 DIAGNOSIS — E119 Type 2 diabetes mellitus without complications: Secondary | ICD-10-CM | POA: Diagnosis not present

## 2020-10-03 DIAGNOSIS — I2510 Atherosclerotic heart disease of native coronary artery without angina pectoris: Secondary | ICD-10-CM | POA: Diagnosis not present

## 2020-10-03 DIAGNOSIS — G4733 Obstructive sleep apnea (adult) (pediatric): Secondary | ICD-10-CM | POA: Diagnosis not present

## 2020-10-03 DIAGNOSIS — B353 Tinea pedis: Secondary | ICD-10-CM

## 2020-10-03 DIAGNOSIS — M109 Gout, unspecified: Secondary | ICD-10-CM

## 2020-10-03 DIAGNOSIS — K219 Gastro-esophageal reflux disease without esophagitis: Secondary | ICD-10-CM

## 2020-10-03 DIAGNOSIS — I10 Essential (primary) hypertension: Secondary | ICD-10-CM

## 2020-10-03 DIAGNOSIS — E782 Mixed hyperlipidemia: Secondary | ICD-10-CM | POA: Diagnosis not present

## 2020-10-03 MED ORDER — BLOOD GLUCOSE MONITOR KIT: PACK | 0 refills | Status: DC

## 2020-10-03 MED ORDER — TERBINAFINE HCL 250 MG PO TABS: 250.0000 mg | ORAL_TABLET | Freq: Every day | ORAL | 0 refills | Status: DC

## 2020-10-03 NOTE — Patient Instructions (Addendum)
Please call [facility] to schedule a follow up of your sleep apnea  [phone] Please start lamisil once daily for athlete's foot. Complete lab work prior to leaving.

## 2020-10-03 NOTE — Progress Notes (Signed)
Subjective:    Patient ID: Steven Payne, male    DOB: [birth year], 74 y.o.   MRN: [id number]  HPI  Patient is a 74 yr old male who presents today for follow up.  Gout- last visit we gave him a trial of colchicine.  Reports that he had quick resolution of his gout pain with use of colchicine.   Tinea pedis-last visit we gave a trial of fluconazole.  He reports that he did have improvement but not resolution.  He continues to apply topical antifungal cream.  OSA-states he never heard from pulmonology about scheduling his follow-up appointment.  DM2- maintained on metformin 1000mg  bid.   Lab Results  Component Value Date   HGBA1C 6.3 06/01/2020   HGBA1C 6.7 (H) 01/05/2020   HGBA1C 6.5 12/22/2018   Lab Results  Component Value Date   MICROALBUR 1.1 03/11/2017   LDLCALC 91 01/05/2020   CREATININE 0.84 06/01/2020   Hyperlipidemia- maintained on atovastatin 40mg .   GERD- uses pepcid 20mg  bid.  HTN- maintained on diltiazem.   BP Readings from Last 3 Encounters:  10/03/20 128/82  09/05/20 (!) 142/77  06/01/20 121/73     Review of Systems See HPI  Past Medical History:  Diagnosis Date  . Arthritis   . Coronary artery disease cardiologist-  dr Irish Lack   per cardiac cath 06/ 2013--  no sig. cad, lvef 60%, sluggish ectatic coronary artery flow of lad, lcx, rc  . ED (erectile dysfunction)   . GERD (gastroesophageal reflux disease)   . History of gout    approx 2002  . Hyperlipemia   . Hypertension   . Hypogonadism male   . Left hydrocele   . 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 Type 2 diabetes mellitus ([place])      Social History   Socioeconomic History  . Marital status: Married    Spouse name: Not on file  . Number of children: Not on file  . Years of education: Not on file  . Highest education level: Not on file    Occupational History  . Not on file  Tobacco Use  . Smoking status: Former Smoker    Years: 1.00    Quit date: 03/27/1991    Years since quitting: 29.5  . Smokeless tobacco: Never Used  Substance and Sexual Activity  . Alcohol use: No    Alcohol/week: 0.0 standard drinks  . Drug use: No  . Sexual activity: Not on file  Other Topics Concern  . 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  No grandchildren.  Oldest in [place], youngest in [place]   Completed bachelors, some graduate (Estate manager/land agent)   Enjoys travelling, walking, cruises   Social Determinants of Health   Financial Resource Strain: [place]   . Difficulty of Paying Living Expenses: Not hard at all  Food Insecurity: No Food Insecurity  . Worried About Charity fundraiser in the Last Year: Never true  . Ran Out of Food in the Last Year: Never true  Transportation Needs: No Transportation Needs  . Lack of Transportation (Medical): No  . Lack of Transportation (Non-Medical): No  Physical Activity:   . Days of Exercise  per Week: Not on file  . Minutes of Exercise per Session: Not on file  Stress:   . Feeling of Stress : Not on file  Social Connections:   . Frequency of Communication with Friends and Family: Not on file  . Frequency of Social Gatherings with Friends and Family: Not on file  . Attends Religious Services: Not on file  . Active Member of Clubs or Organizations: Not on file  . Attends Archivist Meetings: Not on file  . Marital Status: Not on file  Intimate Partner Violence:   . Fear of Current or Ex-Partner: Not on file  . Emotionally Abused: Not on file  . Physically Abused: Not on file  . Sexually Abused: Not on file    Past Surgical History:  Procedure Laterality Date  . APPENDECTOMY  age 33's  . CARDIAC CATHETERIZATION  05-08-2012  dr  Elonda Husky at [facility]   no significant cad, sluggish ectatic coronary artery flow (rca, lad, lcx), normal LVSF, ef 60%  . HEMORROIDECTOMY  1985 ?   ??  . HYDROCELE EXCISION Left 09/10/2016   Procedure: HYDROCELECTOMY ADULT;  Surgeon: Kathie Rhodes, MD;  Location: [facility];  Service: Urology;  Laterality: Left;  . UMBILICAL HERNIA REPAIR  1980's    Family History  Problem Relation Age of Onset  . Coronary artery disease Father   . Diabetes Father   . Prostate cancer Father        died at 62  . Alcohol abuse Father   . Coronary artery disease Mother        died age 52  . Alcohol abuse Mother   . Cancer Mother        unsure, + thyroid cancer  . Breast cancer Paternal Grandmother     Allergies  Allergen Reactions  . Tanzeum [Albiglutide] Other (See Comments)    constipation    Current Outpatient Medications on File Prior to Visit  Medication Sig Dispense Refill  . atorvastatin (LIPITOR) 40 MG tablet Take 1 tablet (40 mg total) by mouth daily. 90 tablet 1  . clopidogrel (PLAVIX) 75 MG tablet Take 1 tablet (75 mg total) by mouth daily. 90 tablet 1  . colchicine 0.6 MG tablet Take 2 tablets by mouth now and 1 tablet one hour later.  May repeat tomorrow if symptoms do not resolve. 6 tablet 0  . Continuous Blood Gluc Sensor (FREESTYLE LIBRE SENSOR SYSTEM) MISC Use as directed 1 each 0  . diltiazem (TIAZAC) 300 MG 24 hr capsule Take 1 capsule (300 mg total) by mouth every morning. 90 capsule 1  . famotidine (PEPCID) 20 MG tablet Take 1 tablet (20 mg total) by mouth daily as needed for heartburn or indigestion. 90 tablet 1  . fluconazole (DIFLUCAN) 150 MG tablet Take one tablet by mouth once weekly 3 tablet 0  . hydrocortisone (ANUSOL-HC) 25 MG suppository Place 1 suppository (25 mg total) rectally 2 (two) times daily as needed for hemorrhoids. 12 suppository 0  . metFORMIN (GLUCOPHAGE) 1000 MG tablet TAKE 1 TABLET TWICE A DAY WITH MEALS ( THIS SHOULD REPLACE 500 MG TWICE A DAY )  180 tablet 1  . Testosterone 1.62 % GEL Place onto the skin.    . valsartan-hydrochlorothiazide (DIOVAN-HCT) 160-25 MG tablet Take 1 tablet by mouth every evening. 90 tablet 1   No current facility-administered medications on file prior to visit.    BP 128/82 (BP Location: Left Arm, Patient Position: Sitting, Cuff Size: Large)   Pulse  66   Resp 18   Ht 5\' 6"  (1.676 m)   Wt 268 lb (121.6 kg)   SpO2 98%   BMI 43.26 kg/m       Objective:   Physical Exam Constitutional:      General: He is not in acute distress.    Appearance: He is well-developed.  HENT:     Head: Normocephalic and atraumatic.  Cardiovascular:     Rate and Rhythm: Normal rate and regular rhythm.     Heart sounds: No murmur heard.   Pulmonary:     Effort: Pulmonary effort is normal. No respiratory distress.     Breath sounds: Normal breath sounds. No wheezing or rales.  Skin:    General: Skin is warm and dry.     Comments: Some hypertrophic scaling skin noted on dorsal right foot and between toes.  Neurological:     Mental Status: He is alert and oriented to person, place, and time.  Psychiatric:        Behavior: Behavior normal.        Thought Content: Thought content normal.           Assessment & Plan:  Tinea pedis-improved but not resolved.  I have advised him to try Lamisil 250 mg once daily for 14 days.  Diabetes type 2-clinically stable.  Obtain A1c.  Continue Metformin 1000 mg twice daily.  Gout-currently stable.  He is working on improving his hydration status.  Continue colchicine on a as needed basis.  Hyperlipidemia-tolerating atorvastatin 40 mg once daily.  Continue same.  LDL was at goal last check.  OSA-I gave the patient the number for low our pulmonology so he can schedule follow-up appointment with the sleep specialist.  GERD-stable on Pepcid 20 mg p.o. twice daily.  Continue same.  Hypertension-blood pressure is stable on diltiazem 300 mg p.o. once daily.  This visit  occurred during the SARS-CoV-2 public health emergency.  Safety protocols were in place, including screening questions prior to the visit, additional usage of staff PPE, and extensive cleaning of exam room while observing appropriate contact time as indicated for disinfecting solutions.

## 2020-10-04 LAB — BASIC METABOLIC PANEL
BUN: 9 mg/dL (ref 7–25)
CO2: 27 mmol/L (ref 20–32)
Calcium: 9.7 mg/dL (ref 8.6–10.3)
Chloride: 101 mmol/L (ref 98–110)
Creat: 0.86 mg/dL (ref 0.70–1.18)
Glucose, Bld: 104 mg/dL — ABNORMAL HIGH (ref 65–99)
Potassium: 4.1 mmol/L (ref 3.5–5.3)
Sodium: 140 mmol/L (ref 135–146)

## 2020-10-04 LAB — HEMOGLOBIN A1C
Hgb A1c MFr Bld: 6.4 % of total Hgb — ABNORMAL HIGH (ref <5.7)
Mean Plasma Glucose: 137 (calc)
eAG (mmol/L): 7.6 (calc)

## 2020-10-13 DIAGNOSIS — B353 Tinea pedis: Secondary | ICD-10-CM | POA: Diagnosis not present

## 2020-10-20 DIAGNOSIS — Z20822 Contact with and (suspected) exposure to covid-19: Secondary | ICD-10-CM | POA: Diagnosis not present

## 2020-11-10 DIAGNOSIS — B353 Tinea pedis: Secondary | ICD-10-CM | POA: Diagnosis not present

## 2020-11-21 ENCOUNTER — Other Ambulatory Visit: Payer: Self-pay | Admitting: Family

## 2020-12-09 DIAGNOSIS — D489 Neoplasm of uncertain behavior, unspecified: Secondary | ICD-10-CM | POA: Diagnosis not present

## 2020-12-09 DIAGNOSIS — D239 Other benign neoplasm of skin, unspecified: Secondary | ICD-10-CM | POA: Diagnosis not present

## 2020-12-22 DIAGNOSIS — B353 Tinea pedis: Secondary | ICD-10-CM | POA: Diagnosis not present

## 2020-12-26 ENCOUNTER — Other Ambulatory Visit: Payer: Self-pay

## 2020-12-26 ENCOUNTER — Ambulatory Visit (INDEPENDENT_AMBULATORY_CARE_PROVIDER_SITE_OTHER): Payer: TRICARE For Life (TFL) | Admitting: Pulmonary Disease

## 2020-12-26 ENCOUNTER — Encounter: Payer: Self-pay | Admitting: Pulmonary Disease

## 2020-12-26 VITALS — BP 122/72 | HR 57 | Temp 97.3°F | Ht 66.0 in | Wt 271.8 lb

## 2020-12-26 DIAGNOSIS — G4733 Obstructive sleep apnea (adult) (pediatric): Secondary | ICD-10-CM | POA: Diagnosis not present

## 2020-12-26 NOTE — Patient Instructions (Signed)
Will arrange for home sleep study Will call to arrange for follow up after sleep study reviewed  

## 2020-12-26 NOTE — Progress Notes (Signed)
[facility]  Chief Complaint  Patient presents with  . Consult    Sleep consult    Constitutional:  BP 122/72 (BP Location: Left Arm, Cuff Size: Large)   Pulse (!) 57   Temp (!) 97.3 F (36.3 C) (Other (Comment)) Comment (Src): wrist  Ht $R'5\' 6"'Xm$  (1.676 m)   Wt 271 lb 12.8 oz (123.3 kg)   SpO2 99% Comment: Room air  BMI 43.87 kg/m   Past Medical History:  CAD, ED, DM, HLD, OA, GERD, Gout  Past Surgical History:  He  has a past surgical history that includes Cardiac catheterization (05-08-2012  dr Elonda Husky at [facility]); Appendectomy (age 27's); Umbilical hernia repair (1980's); Hydrocele surgery (Left, 09/10/2016); and Hemorroidectomy (1985 ?).  Brief Summary:  Steven Payne is a 75 y.o. male former smoker with obstructive sleep apnea.      Subjective:   He had sleep study in 2019.  Showed mild sleep apnea.  He wasn't tried on any therapy.  He has noticed more sleep disruption, and getting sleepy in the afternoon.  He falls sleep easily when watching TV.  He goes to sleep at 9 pm.  He falls asleep in 10 to 15 min.  He wakes up 2 or 3 times to use the bathroom.  He gets out of bed at 530 am.  He feels okay in the morning.  He denies morning headache.  He does not use anything to help him fall sleep or stay awake.  He denies sleep walking, sleep talking, bruxism, or nightmares.  There is no history of restless legs.  He denies sleep hallucinations, sleep paralysis, or cataplexy.  The Epworth score is 9 out of 24.    Physical Exam:   Appearance - well kempt   ENMT - no sinus tenderness, no oral exudate, no LAN, Mallampati 4 airway, no stridor  Respiratory - equal breath sounds bilaterally, no wheezing or rales  CV - s1s2 regular rate and rhythm, no murmurs  Ext - no clubbing, no edema  Skin - no rashes  Psych - normal mood and affect   Pulmonary testing:    Chest Imaging:    Sleep Tests:   HST 10/04/18 >> 11.6, SpO2 low  84%  Cardiac Tests:   Echo 05/12/19 >> EF 60 to 65%, mod LVH, ascending aorta 37 mm, mild/mod MR  Social History:  He  reports that he quit smoking about 29 years ago. He quit after 2.00 years of use. He has never used smokeless tobacco. He reports that he does not drink alcohol and does not use drugs.  Family History:  His family history includes Alcohol abuse in his father and mother; Breast cancer in his paternal grandmother; Cancer in his mother; Coronary artery disease in his father and mother; Diabetes in his father; Prostate cancer in his father.    Discussion:  He has snoring, sleep disruption, and daytime sleepiness.  He has history of CAD, hypertension and DM.  His BMI is > 35.  Previously sleep study showed mild sleep apnea.  I suspect he still has sleep apnea.  Assessment/Plan:   Snoring with excessive daytime sleepiness. - will need to arrange for a home sleep study  Coronary artery disease. - followed by Dr. Salley Scarlet with [facility]  Obesity. - discussed how weight can impact sleep and risk for sleep disordered breathing - discussed options to assist with weight loss: combination of diet modification, cardiovascular and strength training exercises  Cardiovascular risk. - had an extensive discussion regarding the adverse health consequences related to untreated sleep disordered breathing - specifically discussed the risks for hypertension, coronary artery disease, cardiac dysrhythmias, cerebrovascular disease, and diabetes - lifestyle modification discussed  Safe driving practices. - discussed how sleep disruption can increase risk of accidents, particularly when driving - safe driving practices were discussed  Therapies for obstructive sleep apnea. - if the sleep study shows significant sleep apnea, then various therapies for treatment were reviewed: CPAP, oral appliance, and surgical interventions  Time Spent Involved in Patient Care on Day of Examination:   31 minutes  Follow up:  Patient Instructions  Will arrange for home sleep study Will call to arrange for follow up after sleep study reviewed    Medication List:   Allergies as of 12/26/2020      Reactions   Tanzeum [albiglutide] Other (See Comments)   constipation      Medication List       Accurate as of December 26, 2020 11:16 AM. If you have any questions, ask your nurse or doctor.        STOP taking these medications   colchicine 0.6 MG tablet Stopped by: Chesley Mires, MD   terbinafine 250 MG tablet Commonly known as: LamISIL Stopped by: Chesley Mires, MD     TAKE these medications   atorvastatin 40 MG tablet Commonly known as: LIPITOR Take 1 tablet (40 mg total) by mouth daily.   blood glucose meter kit and supplies Kit Dispense based on patient and insurance preference. Use up to four times daily as directed. (FOR ICD-9 250.00, 250.01).   clopidogrel 75 MG tablet Commonly known as: PLAVIX Take 1 tablet (75 mg total) by mouth daily.   diltiazem 300 MG 24 hr capsule Commonly known as: TIAZAC Take 1 capsule (300 mg total) by mouth every morning.   Diovan HCT 160-25 MG tablet Generic drug: valsartan-hydrochlorothiazide TAKE 1 TABLET EVERY EVENING   famotidine 20 MG tablet Commonly known as: PEPCID Take 1 tablet (20 mg total) by mouth daily as needed for heartburn or indigestion.   freestyle lancets TEST UP TO FOUR TIMES DAILY   FREESTYLE LITE test strip Generic drug: glucose blood TEST UP TO FOUR TIMES DAILY   hydrocortisone 25 MG suppository Commonly known as: ANUSOL-HC Place 1 suppository (25 mg total) rectally 2 (two) times daily as needed for hemorrhoids.   metFORMIN 1000 MG tablet Commonly known as: GLUCOPHAGE TAKE 1 TABLET TWICE A DAY WITH MEALS ( THIS SHOULD REPLACE 500 MG TWICE A DAY )   Testosterone 1.62 % Gel Place onto the skin.       Signature:  Chesley Mires, MD [place] Pager - [phone] 12/26/2020, 11:16 AM

## 2021-01-04 ENCOUNTER — Ambulatory Visit (INDEPENDENT_AMBULATORY_CARE_PROVIDER_SITE_OTHER): Payer: Medicare Other | Admitting: Family

## 2021-01-04 ENCOUNTER — Encounter: Payer: Self-pay | Admitting: Family

## 2021-01-04 ENCOUNTER — Other Ambulatory Visit: Payer: Self-pay

## 2021-01-04 VITALS — BP 136/88 | HR 66 | Resp 18 | Ht 66.0 in | Wt 269.0 lb

## 2021-01-04 DIAGNOSIS — G4733 Obstructive sleep apnea (adult) (pediatric): Secondary | ICD-10-CM | POA: Diagnosis not present

## 2021-01-04 DIAGNOSIS — E782 Mixed hyperlipidemia: Secondary | ICD-10-CM

## 2021-01-04 DIAGNOSIS — E119 Type 2 diabetes mellitus without complications: Secondary | ICD-10-CM

## 2021-01-04 DIAGNOSIS — I10 Essential (primary) hypertension: Secondary | ICD-10-CM

## 2021-01-04 DIAGNOSIS — M1A9XX0 Chronic gout, unspecified, without tophus (tophi): Secondary | ICD-10-CM | POA: Diagnosis not present

## 2021-01-04 DIAGNOSIS — K219 Gastro-esophageal reflux disease without esophagitis: Secondary | ICD-10-CM | POA: Diagnosis not present

## 2021-01-04 DIAGNOSIS — E291 Testicular hypofunction: Secondary | ICD-10-CM | POA: Diagnosis not present

## 2021-01-04 DIAGNOSIS — N401 Enlarged prostate with lower urinary tract symptoms: Secondary | ICD-10-CM | POA: Diagnosis not present

## 2021-01-04 DIAGNOSIS — R948 Abnormal results of function studies of other organs and systems: Secondary | ICD-10-CM | POA: Diagnosis not present

## 2021-01-04 DIAGNOSIS — R3912 Poor urinary stream: Secondary | ICD-10-CM | POA: Diagnosis not present

## 2021-01-04 LAB — COMPREHENSIVE METABOLIC PANEL
ALT: 16 U/L (ref 0–53)
AST: 17 U/L (ref 0–37)
Albumin: 4.2 g/dL (ref 3.5–5.2)
Alkaline Phosphatase: 75 U/L (ref 39–117)
BUN: 10 mg/dL (ref 6–23)
CO2: 29 mEq/L (ref 19–32)
Calcium: 9.4 mg/dL (ref 8.4–10.5)
Chloride: 103 mEq/L (ref 96–112)
Creatinine, Ser: 0.84 mg/dL (ref 0.40–1.50)
GFR: 84.88 mL/min (ref >60.00)
Glucose, Bld: 110 mg/dL — ABNORMAL HIGH (ref 70–99)
Potassium: 4.2 mEq/L (ref 3.5–5.1)
Sodium: 140 mEq/L (ref 135–145)
Total Bilirubin: 0.7 mg/dL (ref 0.2–1.2)
Total Protein: 6.9 g/dL (ref 6.0–8.3)

## 2021-01-04 LAB — LIPID PANEL
Cholesterol: 155 mg/dL (ref 0–200)
HDL: 34.2 mg/dL — ABNORMAL LOW (ref >39.00)
LDL Cholesterol: 94 mg/dL (ref 0–99)
NonHDL: 121.09
Total CHOL/HDL Ratio: 5
Triglycerides: 134 mg/dL (ref 0.0–149.0)
VLDL: 26.8 mg/dL (ref 0.0–40.0)

## 2021-01-04 LAB — HEMOGLOBIN A1C: Hgb A1c MFr Bld: 6.6 % — ABNORMAL HIGH (ref 4.6–6.5)

## 2021-01-04 NOTE — Progress Notes (Signed)
Subjective:    Patient ID: Steven Payne, male    DOB: [birth year], 75 y.o.   MRN: [id number]  HPI  Patient is a 75 yr old male who presents today for follow up. On metformin 1045m bid.  DM2-  Lab Results  Component Value Date   HGBA1C 6.4 (H) 10/03/2020   HGBA1C 6.3 06/01/2020   HGBA1C 6.7 (H) 01/05/2020   Lab Results  Component Value Date   MICROALBUR 1.1 03/11/2017   LDLCALC 91 01/05/2020   CREATININE 0.86 10/03/2020   Tinea Pedis- last visit we gave him a 14 day course of oral lamisil.  OSA-saw sleep specialist. Plan is to repeat home sleep study next week.   Hyperlipidemia- maintained on atorvastatin 4101m  Lab Results  Component Value Date   CHOL 161 01/05/2020   HDL 36.30 (L) 01/05/2020   LDLCALC 91 01/05/2020   TRIG 169.0 (H) 01/05/2020   CHOLHDL 4 01/05/2020   GERD- maintained on pepcid 2059mid. Reports symptoms are stable.   HTN- maintained on diovan hct 160-25 and diltiazem 300 mg.   BP Readings from Last 3 Encounters:  01/04/21 136/88  12/26/20 122/72  10/03/20 128/82   Gout- does not take preventative medication.  Completed pfizer booster.    Review of Systems See HPI  Past Medical History:  Diagnosis Date  . Arthritis   . Coronary artery disease cardiologist-  dr varIrish Lackper cardiac cath 06/ 2013--  no sig. cad, lvef 60%, sluggish ectatic coronary artery flow of lad, lcx, rc  . ED (erectile dysfunction)   . GERD (gastroesophageal reflux disease)   . History of gout    approx 2002  . Hyperlipemia   . Hypertension   . Hypogonadism male   . Left hydrocele   . 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 Type 2 diabetes mellitus ([place]    Social History   Socioeconomic History  . Marital status: Married    Spouse name: Not on file  . Number of children: Not on file  . Years of education:  Not on file  . Highest education level: Not on file  Occupational History  . Not on file  Tobacco Use  . Smoking status: Former Smoker    Years: 2.00    Quit date: 03/27/1991    Years since quitting: 29.7  . Smokeless tobacco: Never Used  . Tobacco comment: light smoker (smoked on 2 cigarettes per day for almost 2 years)  Substance and Sexual Activity  . Alcohol use: No    Alcohol/week: 0.0 standard drinks  . Drug use: No  . Sexual activity: Not on file  Other Topics Concern  . 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  [place] bachel[place], some graduate (eleEstate manager/land agent Enjoys travelling, walking, cruises   Social Determinants of Health   Financial Resource Strain: [place]. Difficulty of Paying Living Expenses: Not hard at all  Food Insecurity: No Food Insecurity  . Worried About RunCharity fundraiser the Last Year: Never true  . Ran Out of Food in the Last Year:  Never true  Transportation Needs: No Transportation Needs  . Lack of Transportation (Medical): No  . Lack of Transportation (Non-Medical): No  Physical Activity: Not on file  Stress: Not on file  Social Connections: Not on file  Intimate Partner Violence: Not on file    Past Surgical History:  Procedure Laterality Date  . APPENDECTOMY  age 69's  . CARDIAC CATHETERIZATION  05-08-2012  dr Elonda Husky at [facility]   no significant cad, sluggish ectatic coronary artery flow (rca, lad, lcx), normal LVSF, ef 60%  . HEMORROIDECTOMY  1985 ?   ??  . HYDROCELE EXCISION Left 09/10/2016   Procedure: HYDROCELECTOMY ADULT;  Surgeon: Kathie Rhodes, MD;  Location: [facility];  Service: Urology;  Laterality: Left;  . UMBILICAL HERNIA REPAIR  1980's    Family History  Problem Relation Age of Onset  . Coronary  artery disease Father   . Diabetes Father   . Prostate cancer Father        died at 40  . Alcohol abuse Father   . Coronary artery disease Mother        died age 32  . Alcohol abuse Mother   . Cancer Mother        unsure, + thyroid cancer  . Breast cancer Paternal Grandmother     Allergies  Allergen Reactions  . Tanzeum [Albiglutide] Other (See Comments)    constipation    Current Outpatient Medications on File Prior to Visit  Medication Sig Dispense Refill  . atorvastatin (LIPITOR) 40 MG tablet Take 1 tablet (40 mg total) by mouth daily. 90 tablet 1  . blood glucose meter kit and supplies KIT Dispense based on patient and insurance preference. Use up to four times daily as directed. (FOR ICD-9 250.00, 250.01). 1 each 0  . clopidogrel (PLAVIX) 75 MG tablet Take 1 tablet (75 mg total) by mouth daily. 90 tablet 1  . diltiazem (TIAZAC) 300 MG 24 hr capsule Take 1 capsule (300 mg total) by mouth every morning. 90 capsule 1  . DIOVAN HCT 160-25 MG tablet TAKE 1 TABLET EVERY EVENING 90 tablet 3  . famotidine (PEPCID) 20 MG tablet Take 1 tablet (20 mg total) by mouth daily as needed for heartburn or indigestion. 90 tablet 1  . FREESTYLE LITE test strip TEST UP TO FOUR TIMES DAILY 300 strip 3  . hydrocortisone (ANUSOL-HC) 25 MG suppository Place 1 suppository (25 mg total) rectally 2 (two) times daily as needed for hemorrhoids. 12 suppository 0  . Lancets (FREESTYLE) lancets TEST UP TO FOUR TIMES DAILY 300 each 3  . metFORMIN (GLUCOPHAGE) 1000 MG tablet TAKE 1 TABLET TWICE A DAY WITH MEALS ( THIS SHOULD REPLACE 500 MG TWICE A DAY ) 180 tablet 1  . Testosterone 1.62 % GEL Place onto the skin.     No current facility-administered medications on file prior to visit.    BP 136/88 (BP Location: Left Arm, Patient Position: Sitting, Cuff Size: Large)   Pulse 66   Resp 18   Ht 5' 6" (1.676 m)   Wt 269 lb (122 kg)   SpO2 97%   BMI 43.42 kg/m       Objective:   Physical  Exam Constitutional:      General: He is not in acute distress.    Appearance: He is well-developed and well-nourished.  HENT:     Head: Normocephalic and atraumatic.  Cardiovascular:     Rate and Rhythm: Normal rate and regular rhythm.  Heart sounds: No murmur heard.   Pulmonary:     Effort: Pulmonary effort is normal. No respiratory distress.     Breath sounds: Normal breath sounds. No wheezing or rales.  Musculoskeletal:        General: No edema.  Skin:    General: Skin is warm and dry.  Neurological:     Mental Status: He is alert and oriented to person, place, and time.  Psychiatric:        Mood and Affect: Mood and affect normal.        Behavior: Behavior normal.        Thought Content: Thought content normal.           Assessment & Plan:  Tinea pedis- states that he saw podiatry and was told that "it is as good as it is going to get."  OSA- plans to complete home sleep study. Management per pulmonology.  DM2- clinically stable on metformin 1061m bid. Continue same. Obtain follow up A1C.  Hyperlipidemia- Tolerating atorvastatin 427m  Obtain follow up lipid panel.  GERD- stable on pepcid 2017mid continue same.  Hypertension- BP at goal. Continue  diovan hct 160-25 and diltiazem 300 mg.   Gout- stable.  Monitor.  This visit occurred during the SARS-CoV-2 public health emergency.  Safety protocols were in place, including screening questions prior to the visit, additional usage of staff PPE, and extensive cleaning of exam room while observing appropriate contact time as indicated for disinfecting solutions.

## 2021-01-04 NOTE — Patient Instructions (Signed)
Please complete lab work prior to leaving.   

## 2021-01-06 ENCOUNTER — Other Ambulatory Visit: Payer: Self-pay | Admitting: Family

## 2021-01-16 ENCOUNTER — Other Ambulatory Visit: Payer: Self-pay

## 2021-01-16 ENCOUNTER — Ambulatory Visit: Payer: Medicare Other

## 2021-01-16 DIAGNOSIS — G4733 Obstructive sleep apnea (adult) (pediatric): Secondary | ICD-10-CM | POA: Diagnosis not present

## 2021-01-25 ENCOUNTER — Telehealth: Payer: Self-pay | Admitting: Pulmonary Disease

## 2021-01-25 DIAGNOSIS — G4733 Obstructive sleep apnea (adult) (pediatric): Secondary | ICD-10-CM | POA: Diagnosis not present

## 2021-01-25 NOTE — Telephone Encounter (Signed)
I called patient and went over recs. Patient is scheduled to see Sarah NP on 01/30/21 at 930am. Patient verbalized understanding, nothing further needed.

## 2021-01-25 NOTE — Telephone Encounter (Signed)
Pt returning a phone call. Pt can be reached at [phone].

## 2021-01-25 NOTE — Telephone Encounter (Signed)
01/16/21 >> AHI 17.6, SpO2 low 85%   Please inform him that his sleep study shows moderate obstructive sleep apnea.  Please arrange for ROV with me or NP to discuss treatment options.

## 2021-01-25 NOTE — Telephone Encounter (Signed)
Attempted to call pt but line went straight to VM. Left message for pt to return call. 

## 2021-01-30 ENCOUNTER — Encounter: Payer: Self-pay | Admitting: Acute Care

## 2021-01-30 ENCOUNTER — Ambulatory Visit (INDEPENDENT_AMBULATORY_CARE_PROVIDER_SITE_OTHER): Payer: Medicare Other | Admitting: Acute Care

## 2021-01-30 ENCOUNTER — Other Ambulatory Visit: Payer: Self-pay

## 2021-01-30 VITALS — BP 122/74 | HR 66 | Temp 98.1°F | Ht 66.0 in | Wt 272.0 lb

## 2021-01-30 DIAGNOSIS — Z6841 Body Mass Index (BMI) 40.0 and over, adult: Secondary | ICD-10-CM

## 2021-01-30 DIAGNOSIS — I10 Essential (primary) hypertension: Secondary | ICD-10-CM | POA: Diagnosis not present

## 2021-01-30 DIAGNOSIS — G4733 Obstructive sleep apnea (adult) (pediatric): Secondary | ICD-10-CM | POA: Diagnosis not present

## 2021-01-30 NOTE — Progress Notes (Signed)
Reviewed and agree with assessment/plan.   Chesley Mires, MD [facility]monary/Critical Care 01/30/2021, 1:01 PM Pager:  [phone]

## 2021-01-30 NOTE — Patient Instructions (Addendum)
It is nice to meet you today. We will place an order for a CPAP machine. Patient prefers nasal pillows. CPAP Auto Set 5-15 cm  Wear  CPAP at bedtime.  We will set you up for AirView. Goal is to wear for at least 6 hours each night for maximal clinical benefit. Continue to work on weight loss, as the link between excess weight  and sleep apnea is well established.   Remember to establish a good bedtime routine, and work on sleep hygiene.  Limit daytime naps , avoid stimulants such as caffeine and nicotine close to bedtime, exercise daily to promote sleep quality, avoid heavy , spicy, fried , or rich foods before bed. Ensure adequate exposure to natural light during the day,establish a relaxing bedtime routine with a pleasant sleep environment ( Bedroom between 60 and 67 degrees, turn off bright lights , TV or device screens screens , consider black out curtains or white noise machines) Do not drive if sleepy. Referral to healthy weight and wellness. Try Nettie pot to rinse sinuses.  Remember to clean mask, tubing, filter, and reservoir once weekly with soapy water.  Follow up with Dr. Halford Chessman or Judson Roch NP   In 3 months or before as needed. Please contact office for sooner follow up if symptoms do not improve or worsen or seek emergency care

## 2021-01-30 NOTE — Progress Notes (Signed)
History of Present Illness Steven Payne is a 75 y.o. male former smoker with  OSA. Sleep Study done in 2019 which showed mild sleep apnea. He did not try any therapy.  He was seen for consult by Dr. Halford Chessman.    Past Surgical History:  He  has a past surgical history that includes Cardiac catheterization (05-08-2012  dr Elonda Husky at [facility]); Appendectomy (age 36's); Umbilical hernia repair (1980's); Hydrocele surgery (Left, 09/10/2016); and Hemorroidectomy (1985 ?).Sleep Study done in 2019 which showed mild sleep apnea. He did not try any therapy.   01/30/2021 Follow up to Review Home Sleep Study  Pt. Presents for follow up. He had a home sleep study done 01/16/2021. This study showed AHI 17.6, SpO2 low 85%. This has become progressively worse since 2019 when he had his initial sleep study done.  (11.6, SpO2 low 84%). He endorses weight gain with the Covid Pandemic and self isolating over the last 2 years. He has been unable to go to the gym. . We discussed options for treatment of his moderate OSA  were weight loss, CPAP therapy, oral appliance and surgical assessment. We also discussed the Inspire device. I explained he would need to get his BMI below 35% , preferably 32% before ENT could assess him for [facility]. He stated he did not want an implantable device. He had some initial hesitation about CPAP therapy.   I did explain it was preferred over oral appliance. He feels he can try nasal pillows. We discussed his heart disease and DM, and that untreated sleep apnea contributed to both in a negative manner.We also discussed the risk of HTN,  and cardiac arrhythmias. As he has some underlying cardiac disease, he has opted to try CPAP therapy.        Additionally I have referred him to healthy weight and wellness for weight loss. We discussed enrollment in       [employer] and  Keeping his device clean.      Test Results:       HST: 01/16/21 >> AHI 17.6, SpO2 low 85% ( Moderate Sleep Apnea, with  desaturations)  HST 10/04/18 >> 11.6, SpO2 low 84% ( Mild Sleep Apnea with desaturations)  Echo 05/12/19 >> EF 60 to 65%, mod LVH, ascending aorta 37 mm, mild/mod MR  CBC Latest Ref Rng & Units 08/25/2019 09/10/2016  WBC 3.4 - 10.8 x10E3/uL 6.0 -  Hemoglobin 13.0 - 17.7 g/dL 14.6 13.9  Hematocrit 37.5 - 51.0 % 43.6 41.0  Platelets 150 - 450 x10E3/uL 322 -    BMP Latest Ref Rng & Units 01/04/2021 10/03/2020 06/01/2020  Glucose 70 - 99 mg/dL 110(H) 104(H) 87  BUN 6 - 23 mg/dL $Remove'10 9 12  'XjNDqLP$ Creatinine 0.40 - 1.50 mg/dL 0.84 0.86 0.84  BUN/Creat Ratio 6 - 22 (calc) - NOT APPLICABLE -  Sodium 240 - 145 mEq/L 140 140 137  Potassium 3.5 - 5.1 mEq/L 4.2 4.1 3.8  Chloride 96 - 112 mEq/L 103 101 100  CO2 19 - 32 mEq/L $Remove'29 27 26  'BbtyTaa$ Calcium 8.4 - 10.5 mg/dL 9.4 9.7 9.4    BNP No results found for: BNP  ProBNP No results found for: PROBNP  PFT No results found for: FEV1PRE, FEV1POST, FVCPRE, FVCPOST, TLC, DLCOUNC, PREFEV1FVCRT, PSTFEV1FVCRT  No results found.   Past medical hx Past Medical History:  Diagnosis Date  . Arthritis   . Coronary artery disease cardiologist-  dr Irish Lack   per cardiac cath 06/ 2013--  no sig.  cad, lvef 60%, sluggish ectatic coronary artery flow of lad, lcx, rc  . ED (erectile dysfunction)   . GERD (gastroesophageal reflux disease)   . History of gout    approx 2002  . Hyperlipemia   . Hypertension   . Hypogonadism male   . Left hydrocele   . 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 Type 2 diabetes mellitus (HCC)      Social History   Tobacco Use  . Smoking status: Former Smoker    Years: 2.00    Quit date: 03/27/1991    Years since quitting: 29.8  . Smokeless tobacco: Never Used  . Tobacco comment: light smoker (smoked on 2 cigarettes per day for almost 2 years)  Substance Use Topics  . Alcohol use: No     Alcohol/week: 0.0 standard drinks  . Drug use: No    [name] reports that he quit smoking about 29 years ago. He quit after 2.00 years of use. He has never used smokeless tobacco. He reports that he does not drink alcohol and does not use drugs.  Tobacco Cessation: Former smoker quit 29 years ago with a 2 pack per year smoking history.   Past surgical hx, Family hx, Social hx all reviewed.  Current Outpatient Medications on File Prior to Visit  Medication Sig  . atorvastatin (LIPITOR) 40 MG tablet Take 1 tablet (40 mg total) by mouth daily.  . blood glucose meter kit and supplies KIT Dispense based on patient and insurance preference. Use up to four times daily as directed. (FOR ICD-9 250.00, 250.01).  Marland Kitchen clopidogrel (PLAVIX) 75 MG tablet Take 1 tablet (75 mg total) by mouth daily.  . colchicine 0.6 MG tablet   . diltiazem (TIAZAC) 300 MG 24 hr capsule Take 1 capsule (300 mg total) by mouth every morning.  Marland Kitchen DIOVAN HCT 160-25 MG tablet TAKE 1 TABLET EVERY EVENING  . famotidine (PEPCID) 20 MG tablet TAKE 1 TABLET DAILY AS NEEDED FOR HEARTBURN OR INDIGESTION  . FREESTYLE LITE test strip TEST UP TO FOUR TIMES DAILY  . hydrocortisone (ANUSOL-HC) 25 MG suppository Place 1 suppository (25 mg total) rectally 2 (two) times daily as needed for hemorrhoids.  . Lancets (FREESTYLE) lancets TEST UP TO FOUR TIMES DAILY  . metFORMIN (GLUCOPHAGE) 1000 MG tablet TAKE 1 TABLET TWICE A DAY WITH MEALS ( THIS SHOULD REPLACE 500 MG TWICE A DAY )  . rosuvastatin (CRESTOR) 20 MG tablet Take 1 tablet by mouth daily.  . Testosterone 1.62 % GEL Place onto the skin.   No current facility-administered medications on file prior to visit.     Allergies  Allergen Reactions  . Tanzeum [Albiglutide] Other (See Comments)    constipation    Review Of Systems:  Constitutional:   No  weight loss, night sweats,  Fevers, chills, fatigue, or  Lassitude., + weight gain  HEENT:   No headaches,  Difficulty swallowing,   Tooth/dental problems, or  Sore throat,                No sneezing, itching, ear ache, nasal congestion, post nasal drip,   CV:  No chest pain,  Orthopnea, PND, swelling in lower extremities, anasarca, dizziness, palpitations, syncope.   GI  No heartburn, indigestion, abdominal pain, nausea, vomiting, diarrhea, change in bowel habits, loss of appetite, bloody stools.   Resp: + shortness of  breath with exertion none  at rest.  + excess mucus, no productive cough,  No non-productive cough,  No coughing up of blood.  No change in color of mucus.  No wheezing.  No chest wall deformity  Skin: no rash or lesions.  GU: no dysuria, change in color of urine, no urgency or frequency.  No flank pain, no hematuria   MS:  No joint pain or swelling.  No decreased range of motion.  No back pain.  Psych:  No change in mood or affect. No depression or anxiety.  No memory loss.   Vital Signs BP 122/74 (BP Location: Left Arm, Cuff Size: Normal)   Pulse 66   Temp 98.1 F (36.7 C) (Oral)   Ht $R'5\' 6"'MR$  (1.676 m)   Wt 272 lb (123.4 kg)   SpO2 95%   BMI 43.90 kg/m    Physical Exam:  General- No distress,  A&Ox3, pleasant ENT: No sinus tenderness, TM clear, pale nasal mucosa, no oral exudate,no post nasal drip, no LAN Cardiac: S1, S2, regular rate and rhythm, no murmur Chest: No wheeze/ rales/ dullness; no accessory muscle use, no nasal flaring, no sternal retractions Abd.: Soft Non-tender, ND, BS +, Body mass index is 43.9 kg/m. Ext: No clubbing cyanosis, edema Neuro:  normal strength, MAE x 4, A&O x 3 Skin: No rashes, warm and dry, no lesions Psych: normal mood and behavior   Assessment/Plan  OSA per home sleep study AHI 17.6, SpO2 low 85% Plan We will place an order for a CPAP machine. Patient prefers nasal pillows. CPAP Auto Set 5-15 cm  Wear  CPAP at bedtime.  We will set you up for AirView. Goal is to wear for at least 6 hours each night for maximal clinical benefit. Continue to work  on weight loss, as the link between excess weight  and sleep apnea is well established.  Minimize sedating medications  Remember to establish a good bedtime routine, and work on sleep hygiene.  Limit daytime naps , avoid stimulants such as caffeine and nicotine close to bedtime, exercise daily to promote sleep quality, avoid heavy , spicy, fried , or rich foods before bed.  Ensure adequate exposure to natural light during the day,establish a relaxing bedtime routine with a pleasant sleep environment ( Bedroom between 60 and 67 degrees, turn off bright lights , TV or device screens screens consider black out curtains or white noise machines) Do not drive if sleepy. Referral to healthy weight and wellness. Remember to clean mask, tubing, filter, and reservoir once weekly with soapy water.  Follow up with Dr. Halford Chessman or Judson Roch NP   In 3 months or before as needed. Please contact office for sooner follow up if symptoms do not improve or worsen or seek emergency care     Obesity. - discussed how weight can impact sleep and risk for sleep disordered breathing - discussed options to assist with weight loss: combination of diet modification, cardiovascular and strength training exercises - referral to healthy weight and wellness  Cardiovascular risk. - Discussed  adverse health consequences related to untreated sleep disordered breathing - specifically discussed the risks for hypertension, coronary artery disease, cardiac dysrhythmias, cerebrovascular disease, and diabetes - lifestyle modification discussed - Continue Lipitor, and other heart medications per PCP  Safe driving practices. - Pt. does a lot of driving for work. - discussed how sleep disruption can increase risk of accidents, particularly when driving - safe driving practices were discussed - Don't drive if sleepy  Therapies for obstructive sleep apnea Reviewed ( CPAP/ Oral Appliance, Surgical assessment, Inspire Device). Moderate  Sleep Apnea - Plan Will start CPAP Auto set 5-15 cm H2O Nasal Pillows Follow up in 3 months with down load Enroll in [place], NP 01/30/2021  9:34 AM

## 2021-02-03 ENCOUNTER — Other Ambulatory Visit: Payer: Self-pay | Admitting: Family

## 2021-02-03 NOTE — Telephone Encounter (Signed)
Please verify if Pt should be on Crestor 20mg  or Lipitor 40mg - both are on med list. Thank you.

## 2021-02-13 DIAGNOSIS — D2372 Other benign neoplasm of skin of left lower limb, including hip: Secondary | ICD-10-CM | POA: Diagnosis not present

## 2021-02-13 DIAGNOSIS — D239 Other benign neoplasm of skin, unspecified: Secondary | ICD-10-CM | POA: Diagnosis not present

## 2021-02-13 DIAGNOSIS — D489 Neoplasm of uncertain behavior, unspecified: Secondary | ICD-10-CM | POA: Diagnosis not present

## 2021-02-28 DIAGNOSIS — D239 Other benign neoplasm of skin, unspecified: Secondary | ICD-10-CM | POA: Diagnosis not present

## 2021-02-28 DIAGNOSIS — D489 Neoplasm of uncertain behavior, unspecified: Secondary | ICD-10-CM | POA: Diagnosis not present

## 2021-03-02 DIAGNOSIS — B353 Tinea pedis: Secondary | ICD-10-CM | POA: Diagnosis not present

## 2021-03-02 DIAGNOSIS — L249 Irritant contact dermatitis, unspecified cause: Secondary | ICD-10-CM | POA: Diagnosis not present

## 2021-04-17 DIAGNOSIS — Z20822 Contact with and (suspected) exposure to covid-19: Secondary | ICD-10-CM | POA: Diagnosis not present

## 2021-04-24 ENCOUNTER — Encounter (HOSPITAL_BASED_OUTPATIENT_CLINIC_OR_DEPARTMENT_OTHER): Payer: Self-pay

## 2021-04-24 ENCOUNTER — Emergency Department (HOSPITAL_BASED_OUTPATIENT_CLINIC_OR_DEPARTMENT_OTHER): Payer: Medicare Other

## 2021-04-24 ENCOUNTER — Emergency Department (HOSPITAL_BASED_OUTPATIENT_CLINIC_OR_DEPARTMENT_OTHER)
Admission: EM | Admit: 2021-04-24 | Discharge: 2021-04-24 | Disposition: A | Discharge status: Home / Self Care | Payer: Medicare Other | Attending: Emergency Medicine | Admitting: Emergency Medicine

## 2021-04-24 ENCOUNTER — Other Ambulatory Visit: Payer: Self-pay

## 2021-04-24 DIAGNOSIS — E119 Type 2 diabetes mellitus without complications: Secondary | ICD-10-CM | POA: Diagnosis not present

## 2021-04-24 DIAGNOSIS — Z87891 Personal history of nicotine dependence: Secondary | ICD-10-CM | POA: Diagnosis not present

## 2021-04-24 DIAGNOSIS — Z7902 Long term (current) use of antithrombotics/antiplatelets: Secondary | ICD-10-CM | POA: Insufficient documentation

## 2021-04-24 DIAGNOSIS — U071 COVID-19: Secondary | ICD-10-CM | POA: Diagnosis not present

## 2021-04-24 DIAGNOSIS — E876 Hypokalemia: Secondary | ICD-10-CM | POA: Insufficient documentation

## 2021-04-24 DIAGNOSIS — I10 Essential (primary) hypertension: Secondary | ICD-10-CM | POA: Insufficient documentation

## 2021-04-24 DIAGNOSIS — Z7984 Long term (current) use of oral hypoglycemic drugs: Secondary | ICD-10-CM | POA: Diagnosis not present

## 2021-04-24 DIAGNOSIS — I2510 Atherosclerotic heart disease of native coronary artery without angina pectoris: Secondary | ICD-10-CM | POA: Insufficient documentation

## 2021-04-24 DIAGNOSIS — R059 Cough, unspecified: Secondary | ICD-10-CM | POA: Diagnosis present

## 2021-04-24 LAB — BASIC METABOLIC PANEL
Anion gap: 9 (ref 5–15)
BUN: 8 mg/dL (ref 8–23)
CO2: 22 mmol/L (ref 22–32)
Calcium: 8.3 mg/dL — ABNORMAL LOW (ref 8.9–10.3)
Chloride: 103 mmol/L (ref 98–111)
Creatinine, Ser: 0.76 mg/dL (ref 0.61–1.24)
GFR, Estimated: >60 mL/min (ref >60)
Glucose, Bld: 93 mg/dL (ref 70–99)
Potassium: 3 mmol/L — ABNORMAL LOW (ref 3.5–5.1)
Sodium: 134 mmol/L — ABNORMAL LOW (ref 135–145)

## 2021-04-24 LAB — RESP PANEL BY RT-PCR (FLU A&B, COVID) ARPGX2
Influenza A by PCR: NEGATIVE
Influenza B by PCR: NEGATIVE
SARS Coronavirus 2 by RT PCR: POSITIVE — AB

## 2021-04-24 MED ORDER — NIRMATRELVIR/RITONAVIR (PAXLOVID)TABLET: 3.0000 | ORAL_TABLET | Freq: Two times a day (BID) | ORAL | 0 refills | Status: AC

## 2021-04-24 MED ORDER — ACETAMINOPHEN 500 MG PO TABS
1000.0000 mg | ORAL_TABLET | Freq: Once | ORAL | Status: AC
  Administered 2021-04-24: 1000 mg via ORAL
  Filled 2021-04-24: qty 2

## 2021-04-24 MED ORDER — POTASSIUM CHLORIDE ER 20 MEQ PO TBCR: 40.0000 meq | EXTENDED_RELEASE_TABLET | Freq: Every day | ORAL | 0 refills | Status: DC

## 2021-04-24 NOTE — ED Provider Notes (Signed)
[facility] EMERGENCY DEPARTMENT Provider Note   CSN: [id number] Arrival date & time: 04/24/21  1250   History Chief Complaint  Patient presents with  . Cough   Steven Payne is a 75 y.o. male with past medical history of CAD, GERD, HTN, HLD, and T2DM who presents to [place] [place] ED for evaluation of cough.  Patient states that he was in his normal state of health until two days ago when he began to develop the gradual onset of cough. He states that the cough was initially nonproductive, however it has been worsening and progressed to become productive of yellow sputum. He describes associated symptoms including chills, sore throat, poor appetite, one episode of vomiting, runny nose, and diffuse body aches. He states that he took a rapid test at home which was negative. His wife at home has felt fine and he denies any known sick contacts. He has been vaccinated against COVID-19 with the addition of two booster and also has been vaccinated against influenza.    Past Medical History:  Diagnosis Date  . Arthritis   . Coronary artery disease cardiologist-  dr Irish Lack   per cardiac cath 06/ 2013--  no sig. cad, lvef 60%, sluggish ectatic coronary artery flow of lad, lcx, rc  . ED (erectile dysfunction)   . GERD (gastroesophageal reflux disease)   . History of gout    approx 2002  . Hyperlipemia   . Hypertension   . Hypogonadism male   . Left hydrocele   . 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 Type 2 diabetes mellitus [facility])    Patient Active Problem List   Diagnosis Date Noted  . 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 Diabetes mellitus due to underlying condition with unspecified complications ([place]) 96/28/3662  . ED (erectile dysfunction) 03/11/2017  . Diabetes type 2, controlled ([place]) 02/23/2016  . HTN  (hypertension) 02/23/2016  . Hyperlipidemia 02/23/2016  . Gout 02/23/2016  . GERD (gastroesophageal reflux disease) 02/23/2016  . CAD (coronary artery disease) 02/23/2016   Past Surgical History:  Procedure Laterality Date  . APPENDECTOMY  age 24's  . CARDIAC CATHETERIZATION  05-08-2012  dr Elonda Husky at [facility]   no significant cad, sluggish ectatic coronary artery flow (rca, lad, lcx), normal LVSF, ef 60%  . HEMORROIDECTOMY  1985 ?   ??  . HYDROCELE EXCISION Left 09/10/2016   Procedure: HYDROCELECTOMY ADULT;  Surgeon: Kathie Rhodes, MD;  [facility]  Service: Urology;  Laterality: Left;  . UMBILICAL HERNIA REPAIR  1980's    Family History  Problem Relation Age of Onset  . Coronary artery disease Father   . Diabetes Father   . Prostate cancer Father        died at 28  . Alcohol abuse Father   . Coronary artery disease Mother        died age 51  . Alcohol abuse Mother   . Cancer Mother        unsure, + thyroid cancer  . Breast cancer Paternal Grandmother    Social History   Tobacco Use  . Smoking status: Former Smoker    Years: 2.00    Quit date: 03/27/1991    Years since quitting: 30.0  . Smokeless tobacco: Never Used  . Tobacco comment: light smoker (smoked on 2 cigarettes per day for almost  2 years)  Substance Use Topics  . Alcohol use: No    Alcohol/week: 0.0 standard drinks  . Drug use: No    Home Medications Prior to Admission medications   Medication Sig Start Date End Date Taking? Authorizing Provider  nirmatrelvir/ritonavir EUA (PAXLOVID) TABS Take 3 tablets by mouth 2 (two) times daily for 5 days. Patient GFR is > 60. Take nirmatrelvir (150 mg) two tablets twice daily for 5 days and ritonavir (100 mg) one tablet twice daily for 5 days. 04/24/21 04/29/21 Yes Long, Wonda Olds, MD  potassium chloride 20 MEQ TBCR Take 40 mEq by mouth daily for 5 days. 04/24/21 04/29/21 Yes Long, Wonda Olds, MD  atorvastatin (LIPITOR) 40 MG tablet Take 1 tablet (40 mg  total) by mouth daily. 02/04/21   [name], NP  blood glucose meter kit and supplies KIT Dispense based on patient and insurance preference. Use up to four times daily as directed. (FOR ICD-9 250.00, 250.01). 10/03/20   [name], NP  clopidogrel (PLAVIX) 75 MG tablet Take 1 tablet (75 mg total) by mouth daily. 02/03/21   [name], NP  colchicine 0.6 MG tablet  09/05/20   [provider]  diltiazem (TIAZAC) 300 MG 24 hr capsule Take 1 capsule (300 mg total) by mouth every morning. 02/03/21   [name], NP  DIOVAN HCT 160-25 MG tablet TAKE 1 TABLET EVERY EVENING 11/22/20   [name], NP  famotidine (PEPCID) 20 MG tablet TAKE 1 TABLET DAILY AS NEEDED FOR HEARTBURN OR INDIGESTION 01/06/21   [name], NP  FREESTYLE LITE test strip TEST UP TO FOUR TIMES DAILY 10/03/20   [name], NP  hydrocortisone (ANUSOL-HC) 25 MG suppository Place 1 suppository (25 mg total) rectally 2 (two) times daily as needed for hemorrhoids. 09/16/19   Colon Branch, MD  influenza vaccine adjuvanted (FLUAD) 0.5 ML injection INJECT AS DIRECTED 09/02/20 09/02/21  Carlyle Basques, MD  Lancets (FREESTYLE) lancets TEST UP TO FOUR TIMES DAILY 10/03/20   [name], NP  metFORMIN (GLUCOPHAGE) 1000 MG tablet TAKE 1 TABLET TWICE A DAY WITH MEALS ( THIS SHOULD REPLACE 500 MG TWICE A DAY ) 06/01/20   [name], NP  rosuvastatin (CRESTOR) 20 MG tablet Take 1 tablet by mouth daily. 08/25/15   [provider]  Testosterone 1.62 % GEL Place onto the skin.    [provider]    Allergies    Tanzeum [albiglutide]  Review of Systems   Review of Systems  Constitutional: Positive for chills. Negative for fever.  HENT: Positive for rhinorrhea and sore throat. Negative for ear pain.   Eyes: Negative for pain and visual disturbance.  Respiratory: Positive for cough. Negative for shortness of breath.   Cardiovascular: Negative for chest pain and  palpitations.  Gastrointestinal: Positive for vomiting. Negative for abdominal pain.  Genitourinary: Negative for dysuria and hematuria.  Musculoskeletal: Negative for arthralgias and back pain.  Skin: Negative for color change and rash.  Allergic/Immunologic: Negative for environmental allergies.  Neurological: Negative for seizures and syncope.  All other systems reviewed and are negative.   Physical Exam Updated Vital Signs BP 118/64   Pulse 70   Temp 98.5 F (36.9 C) (Oral)   Resp 20   Ht _0  (1.676 m)   Wt 118.4 kg   SpO2 96%   BMI 42.13 kg/m   Physical Exam Vitals and nursing note reviewed.  Constitutional:      General: He is not in acute distress.  Appearance: He is well-developed. He is obese. He is not ill-appearing.  HENT:     Head: Normocephalic and atraumatic.     Mouth/Throat:     Mouth: Mucous membranes are moist.     Pharynx: Oropharynx is clear. No oropharyngeal exudate or posterior oropharyngeal erythema.  Eyes:     Conjunctiva/sclera: Conjunctivae normal.  Cardiovascular:     Rate and Rhythm: Normal rate and regular rhythm.     Pulses: Normal pulses.     Heart sounds: Normal heart sounds.  Pulmonary:     Effort: Pulmonary effort is normal. No respiratory distress.     Breath sounds: Normal breath sounds. No wheezing or rales.  Abdominal:     General: Abdomen is flat. Bowel sounds are normal.     Palpations: Abdomen is soft.     Tenderness: There is no abdominal tenderness.  Musculoskeletal:        General: Normal range of motion.     Cervical back: Neck supple.     Right lower leg: No edema.     Left lower leg: No edema.  Skin:    General: Skin is warm and dry.  Neurological:     General: No focal deficit present.     Mental Status: He is alert. Mental status is at baseline.  Psychiatric:        Mood and Affect: Mood normal.        Behavior: Behavior normal.    ED Results / Procedures / Treatments   Labs (all labs ordered are  listed, but only abnormal results are displayed) Labs Reviewed  RESP PANEL BY RT-PCR (FLU A&B, COVID) ARPGX2 - Abnormal; Notable for the following components:      Result Value   SARS Coronavirus 2 by RT PCR POSITIVE (*)    All other components within normal limits  BASIC METABOLIC PANEL - Abnormal; Notable for the following components:   Sodium 134 (*)    Potassium 3.0 (*)    Calcium 8.3 (*)    All other components within normal limits   EKG None  Radiology DG Chest Portable 1 View  Result Date: 04/24/2021 CLINICAL DATA:  75 year old male with fever and cough EXAM: PORTABLE CHEST 1 VIEW COMPARISON:  Chest radiograph dated 11/04/2017. FINDINGS: No focal consolidation, pleural effusion or pneumothorax. Top-normal cardiac size. Atherosclerotic calcification of the aortic arch. No acute osseous pathology. IMPRESSION: No acute cardiopulmonary process. Electronically Signed   By: Anner Crete M.D.   On: 04/24/2021 16:05   Medications Ordered in ED Medications  acetaminophen (TYLENOL) tablet 1,000 mg (1,000 mg Oral Given 04/24/21 1614)   ED Course  I have reviewed the triage vital signs and the nursing notes.  Pertinent labs & imaging results that were available during my care of the patient were reviewed by me and considered in my medical decision making (see chart for details).    MDM Rules/Calculators/A&P                          Patient presenting to ED for evaluation of acute onset of productive cough, rhinorrhea, myalgias, chills and sore throat. Patient has low-grade fever with temperature of 100.2 on arrival, although he is hemodynamically stable saturating well on room air. Patient's presentation is most concerning for upper respiratory infection with special consideration to COVID-19 and influenza. Alternatively, patient's symptoms and fever concerning for possible pneumonia. Patient would benefit from obtaining respiratory panel and chest x-ray. Will provide patient with  Tylenol.  Patient's chest x-ray unremarkable for an acute cardiopulmonary abnormality. Respiratory panel is currently pending.  Patient's respiratory panel resulted positive for COVID-19. Will obtain basic metabolic panel to determine dosing for Paxlovid.  Patient's GFR >60, therefore will proceed with full dosing of Paxlovid for five days. Advised patient on isolation requirements and need for his wife to get tested for COVID-19.  Final Clinical Impression(s) / ED Diagnoses Final diagnoses:  COVID-19  Hypokalemia   Rx / DC Orders ED Discharge Orders         Ordered    potassium chloride 20 MEQ TBCR  Daily        04/24/21 1753    nirmatrelvir/ritonavir EUA (PAXLOVID) TABS  2 times daily        04/24/21 1753           Cato Mulligan, MD 04/24/21 Kela Millin, MD 04/27/21 [phone]

## 2021-04-24 NOTE — ED Triage Notes (Signed)
Pt c/o "covid" sx started yesterday-states he had a neg home covid test-NAD-steady gait

## 2021-04-24 NOTE — Discharge Instructions (Addendum)
Mr. Steven Payne,  It was a pleasure having the opportunity to meet you in the emergency department. Unfortunately, you tested positive for the coronavirus which explains the symptoms that you have been experiencing for the past few days. We will prescribe a medication called Paxlovid which should help with your infection. Please take this medication twice daily for the next five days. Do not take your Colchicine while taking this medication.   You will need to stay home for 10 days and isolate from others in your home. Please, wear a well-fitting mask if you must be around others in your home. Please, do not travel during this time.   Sincerely, Dr. Paulla Dolly, MD

## 2021-04-24 NOTE — Progress Notes (Signed)
Pt accompanied from the waiting room to ER room 9. Upon arrival to room pt ambulatory SpO2 96% and after sitting his SpO2 98% on room air. Pt vitals signs obtained. Pt with no complaints of shortness of breath. Pt endorses headache, sore throat, cough and congestion. RT will continue to be available as needed.

## 2021-05-02 ENCOUNTER — Other Ambulatory Visit: Payer: Self-pay

## 2021-05-02 ENCOUNTER — Encounter (HOSPITAL_BASED_OUTPATIENT_CLINIC_OR_DEPARTMENT_OTHER): Payer: Self-pay | Admitting: *Deleted

## 2021-05-02 ENCOUNTER — Emergency Department (HOSPITAL_BASED_OUTPATIENT_CLINIC_OR_DEPARTMENT_OTHER)
Admission: EM | Admit: 2021-05-02 | Discharge: 2021-05-02 | Disposition: A | Discharge status: Home / Self Care | Payer: Medicare Other | Attending: Emergency Medicine | Admitting: Emergency Medicine

## 2021-05-02 DIAGNOSIS — E119 Type 2 diabetes mellitus without complications: Secondary | ICD-10-CM | POA: Insufficient documentation

## 2021-05-02 DIAGNOSIS — I808 Phlebitis and thrombophlebitis of other sites: Secondary | ICD-10-CM | POA: Insufficient documentation

## 2021-05-02 DIAGNOSIS — Z87891 Personal history of nicotine dependence: Secondary | ICD-10-CM | POA: Insufficient documentation

## 2021-05-02 DIAGNOSIS — Z9861 Coronary angioplasty status: Secondary | ICD-10-CM | POA: Diagnosis not present

## 2021-05-02 DIAGNOSIS — Z79899 Other long term (current) drug therapy: Secondary | ICD-10-CM | POA: Diagnosis not present

## 2021-05-02 DIAGNOSIS — I2510 Atherosclerotic heart disease of native coronary artery without angina pectoris: Secondary | ICD-10-CM | POA: Diagnosis not present

## 2021-05-02 DIAGNOSIS — Z7984 Long term (current) use of oral hypoglycemic drugs: Secondary | ICD-10-CM | POA: Diagnosis not present

## 2021-05-02 DIAGNOSIS — M79642 Pain in left hand: Secondary | ICD-10-CM | POA: Diagnosis present

## 2021-05-02 DIAGNOSIS — I809 Phlebitis and thrombophlebitis of unspecified site: Secondary | ICD-10-CM

## 2021-05-02 DIAGNOSIS — I8002 Phlebitis and thrombophlebitis of superficial vessels of left lower extremity: Secondary | ICD-10-CM | POA: Diagnosis not present

## 2021-05-02 DIAGNOSIS — I10 Essential (primary) hypertension: Secondary | ICD-10-CM | POA: Diagnosis not present

## 2021-05-02 NOTE — ED Provider Notes (Signed)
[facility] EMERGENCY DEPARTMENT Provider Note   CSN: [id number] Arrival date & time: 05/02/21  1109     History Chief Complaint  Patient presents with  . Hand Pain    Steven Payne is a 75 y.o. male.  The history is provided by the patient.  Hand Pain   Steven Payne is a 75 y.o. male who presents to the Emergency Department complaining of hand pain and swelling. He presents the emergency department for evaluation of swelling and pain to his left dorsal hand that started a few days ago. One week ago he was seen in the emergency department and had a blood draw from that hand. He is right-hand dominant. He denies any additional swelling, chest pain, difficulty breathing. He does have a history of diabetes, takes Plavix. He does have a history of recent COVID-19 infection. He is still able to use the hand.    Past Medical History:  Diagnosis Date  . Arthritis   . Coronary artery disease cardiologist-  dr Irish Lack   per cardiac cath 06/ 2013--  no sig. cad, lvef 60%, sluggish ectatic coronary artery flow of lad, lcx, rc  . ED (erectile dysfunction)   . GERD (gastroesophageal reflux disease)   . History of gout    approx 2002  . Hyperlipemia   . Hypertension   . Hypogonadism male   . Left hydrocele   . 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 Type 2 diabetes mellitus [facility])     Patient Active Problem List   Diagnosis Date Noted  . 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 Diabetes mellitus due to underlying condition with unspecified complications ([place]) 09/47/0962  . ED (erectile dysfunction) 03/11/2017  . Diabetes type 2, controlled ([place]) 02/23/2016  . HTN (hypertension) 02/23/2016  . Hyperlipidemia 02/23/2016  . Gout 02/23/2016  . GERD (gastroesophageal reflux disease) 02/23/2016  . CAD (coronary artery disease)  02/23/2016    Past Surgical History:  Procedure Laterality Date  . APPENDECTOMY  age 32's  . CARDIAC CATHETERIZATION  05-08-2012  dr Elonda Husky at [facility]   no significant cad, sluggish ectatic coronary artery flow (rca, lad, lcx), normal LVSF, ef 60%  . HEMORROIDECTOMY  1985 ?   ??  . HYDROCELE EXCISION Left 09/10/2016   Procedure: HYDROCELECTOMY ADULT;  Surgeon: Kathie Rhodes, MD;  Location: [facility];  Service: Urology;  Laterality: Left;  . UMBILICAL HERNIA REPAIR  1980's       Family History  Problem Relation Age of Onset  . Coronary artery disease Father   . Diabetes Father   . Prostate cancer Father        died at 67  . Alcohol abuse Father   . Coronary artery disease Mother        died age 8  . Alcohol abuse Mother   . Cancer Mother        unsure, + thyroid cancer  . Breast cancer Paternal Grandmother     Social History   Tobacco Use  . Smoking status: Former Smoker    Years: 2.00    Quit date: 03/27/1991    Years since quitting: 30.1  . Smokeless tobacco: Never Used  . Tobacco comment: light smoker (smoked on 2 cigarettes per day for almost 2 years)  Substance Use Topics  . Alcohol use: No  Alcohol/week: 0.0 standard drinks  . Drug use: No    Home Medications Prior to Admission medications   Medication Sig Start Date End Date Taking? Authorizing Provider  atorvastatin (LIPITOR) 40 MG tablet Take 1 tablet (40 mg total) by mouth daily. 02/04/21   [name], NP  blood glucose meter kit and supplies KIT Dispense based on patient and insurance preference. Use up to four times daily as directed. (FOR ICD-9 250.00, 250.01). 10/03/20   [name], NP  clopidogrel (PLAVIX) 75 MG tablet Take 1 tablet (75 mg total) by mouth daily. 02/03/21   [name], NP  colchicine 0.6 MG tablet  09/05/20   [provider]  diltiazem (TIAZAC) 300 MG 24 hr capsule Take 1 capsule (300 mg total) by mouth every morning. 02/03/21   [name], NP  DIOVAN HCT 160-25 MG tablet TAKE 1 TABLET EVERY EVENING 11/22/20   [name], NP  famotidine (PEPCID) 20 MG tablet TAKE 1 TABLET DAILY AS NEEDED FOR HEARTBURN OR INDIGESTION 01/06/21   [name], NP  FREESTYLE LITE test strip TEST UP TO FOUR TIMES DAILY 10/03/20   [name], NP  hydrocortisone (ANUSOL-HC) 25 MG suppository Place 1 suppository (25 mg total) rectally 2 (two) times daily as needed for hemorrhoids. 09/16/19   Colon Branch, MD  influenza vaccine adjuvanted (FLUAD) 0.5 ML injection INJECT AS DIRECTED 09/02/20 09/02/21  Carlyle Basques, MD  Lancets (FREESTYLE) lancets TEST UP TO FOUR TIMES DAILY 10/03/20   [name], NP  metFORMIN (GLUCOPHAGE) 1000 MG tablet TAKE 1 TABLET TWICE A DAY WITH MEALS ( THIS SHOULD REPLACE 500 MG TWICE A DAY ) 06/01/20   [name], NP  potassium chloride 20 MEQ TBCR Take 40 mEq by mouth daily for 5 days. 04/24/21 04/29/21  Long, Wonda Olds, MD  rosuvastatin (CRESTOR) 20 MG tablet Take 1 tablet by mouth daily. 08/25/15   [provider]  Testosterone 1.62 % GEL Place onto the skin.    [provider]    Allergies    Tanzeum [albiglutide]  Review of Systems   Review of Systems  All other systems reviewed and are negative.   Physical Exam Updated Vital Signs BP 123/87 (BP Location: Right Arm)   Pulse 67   Temp 98.1 F (36.7 C) (Oral)   Resp 18   Ht $R'5\' 6"'Yx$  (1.676 m)   Wt 118.4 kg   SpO2 98%   BMI 42.13 kg/m   Physical Exam Vitals and nursing note reviewed.  Constitutional:      Appearance: He is well-developed.  HENT:     Head: Normocephalic and atraumatic.  Cardiovascular:     Rate and Rhythm: Normal rate and regular rhythm.  Pulmonary:     Effort: Pulmonary effort is normal. No respiratory distress.  Musculoskeletal:     Comments: 2+ radial pulses bilaterally. There is mild soft tissue swelling to the left dorsal hand with mild tenderness over the mid hand without  significant erythema. There is no edema to the wrist or proximal left upper extremity. Range of motion is intact throughout the hand.  Skin:    General: Skin is warm and dry.  Neurological:     Mental Status: He is alert and oriented to person, place, and time.  Psychiatric:        Behavior: Behavior normal.     ED Results / Procedures / Treatments   Labs (all labs ordered are listed, but only abnormal results are displayed) Labs Reviewed - No data  to display  EKG None  Radiology No results found.  Procedures Procedures   Medications Ordered in ED Medications - No data to display  ED Course  I have reviewed the triage vital signs and the nursing notes.  Pertinent labs & imaging results that were available during my care of the patient were reviewed by me and considered in my medical decision making (see chart for details).    MDM Rules/Calculators/A&P                         patient here for evaluation of hand swelling following a venous puncture at that site one week ago. Examination is consistent with phlebitis, no evidence of DVT or acute infectious process that based on current examination. Discussed with patient local care with close outpatient follow-up and return precautions.  Final Clinical Impression(s) / ED Diagnoses Final diagnoses:  Superficial phlebitis    Rx / DC Orders ED Discharge Orders    None       Quintella Reichert, MD 05/02/21 1155

## 2021-05-02 NOTE — ED Notes (Signed)
Pt verbalizes discharge instructions. No signature pad on computer to obtain signature. No further questions at this time.

## 2021-05-02 NOTE — ED Triage Notes (Signed)
Pain and swelling to his left hand. States it started after having blood drawn from the same location.

## 2021-05-03 DIAGNOSIS — Z20822 Contact with and (suspected) exposure to covid-19: Secondary | ICD-10-CM | POA: Diagnosis not present

## 2021-05-08 ENCOUNTER — Ambulatory Visit: Payer: Medicare Other | Admitting: Family

## 2021-05-24 ENCOUNTER — Other Ambulatory Visit: Payer: Self-pay

## 2021-05-24 ENCOUNTER — Ambulatory Visit (INDEPENDENT_AMBULATORY_CARE_PROVIDER_SITE_OTHER): Payer: Medicare Other | Admitting: Family

## 2021-05-24 ENCOUNTER — Encounter: Payer: Self-pay | Admitting: Family

## 2021-05-24 VITALS — BP 138/80 | HR 60 | Temp 98.0°F | Ht 66.0 in | Wt 265.0 lb

## 2021-05-24 DIAGNOSIS — I2510 Atherosclerotic heart disease of native coronary artery without angina pectoris: Secondary | ICD-10-CM

## 2021-05-24 DIAGNOSIS — E088 Diabetes mellitus due to underlying condition with unspecified complications: Secondary | ICD-10-CM | POA: Diagnosis not present

## 2021-05-24 DIAGNOSIS — I25119 Atherosclerotic heart disease of native coronary artery with unspecified angina pectoris: Secondary | ICD-10-CM | POA: Diagnosis not present

## 2021-05-24 DIAGNOSIS — G4733 Obstructive sleep apnea (adult) (pediatric): Secondary | ICD-10-CM | POA: Insufficient documentation

## 2021-05-24 DIAGNOSIS — E782 Mixed hyperlipidemia: Secondary | ICD-10-CM

## 2021-05-24 DIAGNOSIS — I10 Essential (primary) hypertension: Secondary | ICD-10-CM

## 2021-05-24 DIAGNOSIS — E119 Type 2 diabetes mellitus without complications: Secondary | ICD-10-CM | POA: Diagnosis not present

## 2021-05-24 DIAGNOSIS — Z8616 Personal history of COVID-19: Secondary | ICD-10-CM

## 2021-05-24 DIAGNOSIS — K219 Gastro-esophageal reflux disease without esophagitis: Secondary | ICD-10-CM

## 2021-05-24 DIAGNOSIS — I7789 Other specified disorders of arteries and arterioles: Secondary | ICD-10-CM

## 2021-05-24 HISTORY — DX: Obstructive sleep apnea (adult) (pediatric): G47.33

## 2021-05-24 HISTORY — DX: Personal history of COVID-19: Z86.16

## 2021-05-24 LAB — COMPREHENSIVE METABOLIC PANEL
ALT: 9 U/L (ref 0–53)
AST: 13 U/L (ref 0–37)
Albumin: 4.1 g/dL (ref 3.5–5.2)
Alkaline Phosphatase: 90 U/L (ref 39–117)
BUN: 8 mg/dL (ref 6–23)
CO2: 25 mEq/L (ref 19–32)
Calcium: 9.1 mg/dL (ref 8.4–10.5)
Chloride: 102 mEq/L (ref 96–112)
Creatinine, Ser: 0.77 mg/dL (ref 0.40–1.50)
GFR: 86.91 mL/min (ref >60.00)
Glucose, Bld: 98 mg/dL (ref 70–99)
Potassium: 3.7 mEq/L (ref 3.5–5.1)
Sodium: 138 mEq/L (ref 135–145)
Total Bilirubin: 0.4 mg/dL (ref 0.2–1.2)
Total Protein: 6.9 g/dL (ref 6.0–8.3)

## 2021-05-24 LAB — HEMOGLOBIN A1C: Hgb A1c MFr Bld: 6.5 % (ref 4.6–6.5)

## 2021-05-24 MED ORDER — FAMOTIDINE 20 MG PO TABS: 20.0000 mg | ORAL_TABLET | Freq: Two times a day (BID) | ORAL | 1 refills | Status: DC

## 2021-05-24 NOTE — Assessment & Plan Note (Signed)
Clinically stable. Refer back to cardiology.  Continue plavix 75mg  once daily.

## 2021-05-24 NOTE — Assessment & Plan Note (Signed)
Pt was treated with Paxlovid and thankfully had a mild case. He has fully recovered.

## 2021-05-24 NOTE — Assessment & Plan Note (Signed)
Recommend trial of breath right strips and follow up with sleep medicine.

## 2021-05-24 NOTE — Patient Instructions (Signed)
Please complete lab work prior to leaving.   

## 2021-05-24 NOTE — Assessment & Plan Note (Signed)
Uncontrolled. Discussed gerd diet. Recommend increase famotidine from 20mg  daily to bid.

## 2021-05-24 NOTE — Assessment & Plan Note (Signed)
BP is stable. Continue diltiazem 300mg  and diovan hct 160mg -25.  Check cmet.

## 2021-05-24 NOTE — Assessment & Plan Note (Signed)
Will refer back to cardiology for follow up.

## 2021-05-24 NOTE — Progress Notes (Signed)
Subjective:   By signing my name below, I, [name], attest that this documentation has been prepared under the direction and in the presence of [name] NP. 05/24/2021      Patient ID: Steven Payne, male    DOB: [birth year], 75 y.o.   MRN: [id number]  Chief Complaint  Patient presents with   Follow-up    HPI Patient is in today for a office visit. He reports that he had Covid-19 in the beginning of this month, June, 2022. His only symptoms was a mild headache and his symptoms shortly resolved. GERD- He complains that he developed acid reflux recently again. He is taking 20 mg Pepcid daily PO to manage his symptoms. CPAP- He complains while sleeping using the CPAP machine he gets congested. He is struggling to stay asleep while on the CPAP. He tried to set up an appointment with the provider who prescribed him the CPAP machine and could not set up an appointment soon. He is planning to use nose breathing strips to help manage his congestion.  He is planning to lose weight to help manage his sleep apnea.  Hypertension- His blood pressure is doing well during this visit. He continues taking 300 mg diltiazem daily PO and 160-25 mg Diovan HCT daily PO to manage his blood pressure and reports doing well on them. He denies having any leg swelling at this time. Hyperlipidemia- He continues taking 40 mg atorvastatin daily PO to manage his cholesterol and reports doing well while on it.  Vision- He is due on vision care.  Health Maintenance Due  Topic Date Due   OPHTHALMOLOGY EXAM  10/07/2020    Past Medical History:  Diagnosis Date   Arthritis    Coronary artery disease cardiologist-  dr Irish Lack   per cardiac cath 06/ 2013--  no sig. 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   ??   HYDROCELE EXCISION Left 09/10/2016   Procedure: HYDROCELECTOMY ADULT;  Surgeon: Kathie Rhodes, MD;  Location: [facility];  Service: Urology;  Laterality: Left;   UMBILICAL HERNIA REPAIR  80's    Family History  Problem Relation Age of Onset   Coronary artery disease Father    Diabetes Father    Prostate cancer Father        died at 79   Alcohol abuse Father    Coronary artery disease Mother        died age 63   Alcohol abuse Mother    Cancer Mother        unsure, + thyroid cancer   Breast cancer Paternal Grandmother     Social History   Socioeconomic History   Marital status: Married  Spouse name: Not on file   Number of children: Not on file   Years of education: Not on file   Highest education level: Not on file  Occupational History   Not on file  Tobacco Use   Smoking status: Former    Years: 2.00    Pack years: 0.00    Types: Cigarettes    Quit date: 03/27/1991    Years since quitting: 30.1   Smokeless tobacco: Never   Tobacco comments:    light smoker (smoked on 2 cigarettes per day for almost 2 years)  Substance and Sexual Activity   Alcohol use: No    Alcohol/week: 0.0 standard drinks   Drug use: No   Sexual activity: Not on file  Other Topics Concern   Not on file  Social History Narrative   Retired Corporate treasurer, 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  No grandchildren.   Oldest in [place], youngest in [place]   Completed bachelors, some graduate (Estate manager/land agent)   Enjoys travelling, walking, cruises   Social Determinants of Radio broadcast assistant Strain: Low Risk    Difficulty of Paying Living Expenses: Not hard at all  Food Insecurity: No Food Insecurity   Worried About Charity fundraiser in the Last Year: Never true   Arboriculturist in the Last Year: Never true  Transportation Needs: No Transportation Needs   Lack of Transportation (Medical): No   Lack of Transportation (Non-Medical): No  Physical Activity: Not on file  Stress: Not on file  Social Connections: Not on file  Intimate Partner Violence: Not on file    Outpatient Medications Prior to Visit  Medication Sig Dispense Refill   atorvastatin (LIPITOR) 40 MG tablet Take 1 tablet (40 mg total) by mouth daily. 90 tablet 1   blood glucose meter kit and supplies KIT Dispense based on patient and insurance preference. Use up to four times daily as directed. (FOR ICD-9 250.00, 250.01). 1 each 0   clopidogrel (PLAVIX) 75 MG tablet Take 1 tablet (75 mg total) by mouth daily. 90 tablet 1   colchicine 0.6 MG tablet      diltiazem (TIAZAC) 300 MG 24 hr capsule Take 1 capsule (300 mg total) by mouth every morning. 90 capsule 1   DIOVAN HCT 160-25 MG tablet TAKE 1 TABLET EVERY EVENING 90 tablet 3   FREESTYLE LITE test strip TEST UP TO FOUR TIMES DAILY 300 strip 3   hydrocortisone (ANUSOL-HC) 25 MG suppository Place 1 suppository (25 mg total) rectally 2 (two) times daily as needed for hemorrhoids. 12 suppository 0   influenza vaccine adjuvanted (FLUAD) 0.5 ML injection INJECT AS DIRECTED .5 mL 0   Lancets (FREESTYLE) lancets TEST UP TO FOUR TIMES DAILY 300 each 3   metFORMIN (GLUCOPHAGE) 1000 MG tablet TAKE 1 TABLET TWICE A DAY WITH MEALS ( THIS SHOULD REPLACE 500 MG TWICE A DAY ) 180 tablet 1   rosuvastatin (CRESTOR) 20 MG tablet Take 1 tablet by mouth daily.     Testosterone 1.62 % GEL  Place onto the skin.     famotidine (PEPCID) 20 MG tablet TAKE 1 TABLET DAILY AS NEEDED FOR HEARTBURN OR INDIGESTION 90 tablet 3   potassium chloride 20 MEQ TBCR Take 40 mEq by mouth daily for 5 days. 10 tablet 0   No facility-administered medications prior to visit.    Allergies  Allergen Reactions   Tanzeum [Albiglutide] Other (See Comments)    constipation    Review of Systems  Cardiovascular:  Negative for leg swelling.      Objective:    Physical Exam Constitutional:      General: He is not in acute distress.    Appearance: Normal appearance. He is not ill-appearing.  HENT:     Head: Normocephalic and atraumatic.     Right Ear: External ear normal.     Left Ear: External ear normal.  Eyes:     Extraocular Movements: Extraocular movements intact.     Pupils: Pupils are equal, round, and reactive to light.  Cardiovascular:     Rate and Rhythm: Normal rate and regular rhythm.     Pulses: Normal pulses.     Heart sounds: Normal heart sounds. No murmur heard.   No gallop.  Pulmonary:     Effort: Pulmonary effort is normal. No respiratory distress.     Breath sounds: Normal breath sounds. No wheezing, rhonchi or rales.  Skin:    General: Skin is warm and dry.  Neurological:     Mental Status: He is alert and oriented to person, place, and time.  Psychiatric:        Behavior: Behavior normal.    BP 138/80   Pulse 60   Temp 98 F (36.7 C) (Oral)   Ht 5\' 6"  (1.676 m)   Wt 265 lb (120.2 kg)   SpO2 98%   BMI 42.77 kg/m  Wt Readings from Last 3 Encounters:  05/24/21 265 lb (120.2 kg)  05/02/21 261 lb 0.4 oz (118.4 kg)  04/24/21 261 lb (118.4 kg)    Diabetic Foot Exam - Simple   No data filed    Lab Results  Component Value Date   WBC 6.0 08/25/2019   HGB 14.6 08/25/2019   HCT 43.6 08/25/2019   PLT 322 08/25/2019   GLUCOSE 93 04/24/2021   CHOL 155 01/04/2021   TRIG 134.0 01/04/2021   HDL 34.20 (L) 01/04/2021   LDLCALC 94 01/04/2021   ALT 16  01/04/2021   AST 17 01/04/2021   NA 134 (L) 04/24/2021   K 3.0 (L) 04/24/2021   CL 103 04/24/2021   CREATININE 0.76 04/24/2021   BUN 8 04/24/2021   CO2 22 04/24/2021   TSH 2.480 08/25/2019   HGBA1C 6.6 (H) 01/04/2021   MICROALBUR 1.1 03/11/2017    Lab Results  Component Value Date   TSH 2.480 08/25/2019   Lab Results  Component Value Date   WBC 6.0 08/25/2019   HGB 14.6 08/25/2019   HCT 43.6 08/25/2019   MCV 83 08/25/2019   PLT 322 08/25/2019   Lab Results  Component Value Date   NA 134 (L) 04/24/2021   K 3.0 (L) 04/24/2021   CO2 22 04/24/2021   GLUCOSE 93 04/24/2021   BUN 8 04/24/2021   CREATININE 0.76 04/24/2021   BILITOT 0.7 01/04/2021   ALKPHOS 75 01/04/2021   AST 17 01/04/2021   ALT 16 01/04/2021   PROT 6.9 01/04/2021   ALBUMIN 4.2 01/04/2021   CALCIUM 8.3 (L) 04/24/2021   ANIONGAP 9 04/24/2021   GFR 84.88 01/04/2021   Lab Results  Component Value Date   CHOL 155 01/04/2021   Lab Results  Component Value Date   HDL 34.20 (L) 01/04/2021   Lab Results  Component Value Date   LDLCALC 94 01/04/2021   Lab Results  Component Value Date   TRIG 134.0 01/04/2021  Lab Results  Component Value Date   CHOLHDL 5 01/04/2021   Lab Results  Component Value Date   HGBA1C 6.6 (H) 01/04/2021       Assessment & Plan:   Problem List Items Addressed This Visit       Unprioritized   OSA (obstructive sleep apnea)    Recommend trial of breath right strips and follow up with sleep medicine.        Hyperlipidemia    LDL at goal. Continue atorvastatin 40mg .        HTN (hypertension)    BP is stable. Continue diltiazem 300mg  and diovan hct 160mg -25.  Check cmet.        History of COVID-19    Pt was treated with Paxlovid and thankfully had a mild case. He has fully recovered.        GERD (gastroesophageal reflux disease)    Uncontrolled. Discussed gerd diet. Recommend increase famotidine from 20mg  daily to bid.        Relevant Medications    famotidine (PEPCID) 20 MG tablet   Diabetes type 2, controlled ([place]) - Primary    Clinically stable.  Continue metformin 1000mg  bid. Check A1C. Recommend follow up eye exam.        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        Atherosclerosis of native coronary artery of native heart with angina pectoris ([place])    Clinically stable. Refer back to cardiology.  Continue plavix 75mg  once daily.          Meds ordered this encounter  Medications   famotidine (PEPCID) 20 MG tablet    Sig: Take 1 tablet (20 mg total) by mouth 2 (two) times daily.    Dispense:  180 tablet    Refill:  1    Order Specific Question:   Supervising Provider    Answer:   Penni Homans A [[id number]]    I, [name] NP, personally preformed the services described in this documentation.  All medical record entries made by the scribe were at my direction and in my presence.  I have reviewed the chart and discharge instructions (if applicable) and agree that the record reflects my personal performance and is accurate and complete. 05/24/2021   I,[name],acting as a Education administrator for Nance Pear, NP.,have documented all relevant documentation on the behalf of Nance Pear, NP,as directed by  Nance Pear, NP while in the presence of Nance Pear, NP.   Nance Pear, NP

## 2021-05-24 NOTE — Assessment & Plan Note (Signed)
LDL at goal. Continue atorvastatin 40mg .

## 2021-05-24 NOTE — Assessment & Plan Note (Addendum)
Clinically stable.  Continue metformin 1000mg  bid. Check A1C. Recommend follow up eye exam.

## 2021-05-25 NOTE — Progress Notes (Signed)
Mailed ou to patient 

## 2021-06-08 ENCOUNTER — Telehealth: Payer: Self-pay | Admitting: Family

## 2021-06-08 NOTE — Telephone Encounter (Signed)
Spoke with patient he requested a call back in August

## 2021-06-16 DIAGNOSIS — Z20822 Contact with and (suspected) exposure to covid-19: Secondary | ICD-10-CM | POA: Diagnosis not present

## 2021-06-19 ENCOUNTER — Other Ambulatory Visit: Payer: Self-pay | Admitting: Family

## 2021-06-30 DIAGNOSIS — E291 Testicular hypofunction: Secondary | ICD-10-CM | POA: Diagnosis not present

## 2021-07-17 DIAGNOSIS — Z8739 Personal history of other diseases of the musculoskeletal system and connective tissue: Secondary | ICD-10-CM | POA: Insufficient documentation

## 2021-07-17 DIAGNOSIS — E291 Testicular hypofunction: Secondary | ICD-10-CM | POA: Insufficient documentation

## 2021-07-17 DIAGNOSIS — N433 Hydrocele, unspecified: Secondary | ICD-10-CM | POA: Insufficient documentation

## 2021-07-17 DIAGNOSIS — M1990 Unspecified osteoarthritis, unspecified site: Secondary | ICD-10-CM | POA: Insufficient documentation

## 2021-07-19 ENCOUNTER — Ambulatory Visit (INDEPENDENT_AMBULATORY_CARE_PROVIDER_SITE_OTHER): Payer: Medicare Other | Admitting: Cardiology

## 2021-07-19 ENCOUNTER — Other Ambulatory Visit: Payer: Self-pay

## 2021-07-19 ENCOUNTER — Encounter: Payer: Self-pay | Admitting: Cardiology

## 2021-07-19 VITALS — BP 144/80 | HR 59 | Ht 66.0 in | Wt 261.0 lb

## 2021-07-19 DIAGNOSIS — I10 Essential (primary) hypertension: Secondary | ICD-10-CM

## 2021-07-19 DIAGNOSIS — I25119 Atherosclerotic heart disease of native coronary artery with unspecified angina pectoris: Secondary | ICD-10-CM

## 2021-07-19 DIAGNOSIS — R931 Abnormal findings on diagnostic imaging of heart and coronary circulation: Secondary | ICD-10-CM

## 2021-07-19 DIAGNOSIS — E782 Mixed hyperlipidemia: Secondary | ICD-10-CM

## 2021-07-19 DIAGNOSIS — I77810 Thoracic aortic ectasia: Secondary | ICD-10-CM | POA: Insufficient documentation

## 2021-07-19 DIAGNOSIS — E088 Diabetes mellitus due to underlying condition with unspecified complications: Secondary | ICD-10-CM | POA: Diagnosis not present

## 2021-07-19 HISTORY — DX: Thoracic aortic ectasia: I77.810

## 2021-07-19 HISTORY — DX: Morbid (severe) obesity due to excess calories: E66.01

## 2021-07-19 NOTE — Patient Instructions (Signed)
Medication Instructions:  No medication changes. *If you need a refill on your cardiac medications before your next appointment, please call your pharmacy*   Lab Work: None ordered If you have labs (blood work) drawn today and your tests are completely normal, you will receive your results only by: [place] (if you have MyChart) OR A paper copy in the mail If you have any lab test that is abnormal or we need to change your treatment, we will call you to review the results.   Testing/Procedures: Your physician has requested that you have an echocardiogram. Echocardiography is a painless test that uses sound waves to create images of your heart. It provides your doctor with information about the size and shape of your heart and how well your heart's chambers and valves are working. This procedure takes approximately one hour. There are no restrictions for this procedure.  Non-Cardiac CT Angiography (CTA), is a special type of CT scan that uses a computer to produce multi-dimensional views of major blood vessels throughout the body. In CT angiography, a contrast material is injected through an IV to help visualize the blood vessels    Follow-Up: At [facility], you and your health needs are our priority.  As part of our continuing mission to provide you with exceptional heart care, we have created designated Provider Care Teams.  These Care Teams include your primary Cardiologist (physician) and Advanced Practice Providers (APPs -  Physician Assistants and Nurse Practitioners) who all work together to provide you with the care you need, when you need it.  We recommend signing up for the patient portal called "MyChart".  Sign up information is provided on this After Visit Summary.  MyChart is used to connect with patients for Virtual Visits (Telemedicine).  Patients are able to view lab/test results, encounter notes, upcoming appointments, etc.  Non-urgent messages can be sent to your  provider as well.   To learn more about what you can do with MyChart, go to [email or web address].    Your next appointment:   6 month(s)  The format for your next appointment:   In Person  Provider:   Jyl Heinz, MD   Other Instructions Echocardiogram An echocardiogram is a test that uses sound waves (ultrasound) to produce images of the heart. Images from an echocardiogram can provide important information about: Heart size and shape. The size and thickness and movement of your heart's walls. Heart muscle function and strength. Heart valve function or if you have stenosis. Stenosis is when the heart valves are too narrow. If blood is flowing backward through the heart valves (regurgitation). A tumor or infectious growth around the heart valves. Areas of heart muscle that are not working well because of poor blood flow or injury from a heart attack. Aneurysm detection. An aneurysm is a weak or damaged part of an artery wall. The wall bulges out from the normal force of blood pumping through the body. Tell a health care provider about: Any allergies you have. All medicines you are taking, including vitamins, herbs, eye drops, creams, and over-the-counter medicines. Any blood disorders you have. Any surgeries you have had. Any medical conditions you have. Whether you are pregnant or may be pregnant. What are the risks? Generally, this is a safe test. However, problems may occur, including an allergic reaction to dye (contrast) that may be used during the test. What happens before the test? No specific preparation is needed. You may eat and drink normally. What happens during the test? You  will take off your clothes from the waist up and put on a hospital gown. Electrodes or electrocardiogram (ECG)patches may be placed on your chest. The electrodes or patches are then connected to a device that monitors your heart rate and rhythm. You will lie down on a table for an  ultrasound exam. A gel will be applied to your chest to help sound waves pass through your skin. A handheld device, called a transducer, will be pressed against your chest and moved over your heart. The transducer produces sound waves that travel to your heart and bounce back (or "echo" back) to the transducer. These sound waves will be captured in real-time and changed into images of your heart that can be viewed on a video monitor. The images will be recorded on a computer and reviewed by your health care provider. You may be asked to change positions or hold your breath for a short time. This makes it easier to get different views or better views of your heart. In some cases, you may receive contrast through an IV in one of your veins. This can improve the quality of the pictures from your heart. The procedure may vary among health care providers and hospitals.   What can I expect after the test? You may return to your normal, everyday life, including diet, activities, and medicines, unless your health care provider tells you not to do that. Follow these instructions at home: It is up to you to get the results of your test. Ask your health care provider, or the department that is doing the test, when your results will be ready. Keep all follow-up visits. This is important. Summary An echocardiogram is a test that uses sound waves (ultrasound) to produce images of the heart. Images from an echocardiogram can provide important information about the size and shape of your heart, heart muscle function, heart valve function, and other possible heart problems. You do not need to do anything to prepare before this test. You may eat and drink normally. After the echocardiogram is completed, you may return to your normal, everyday life, unless your health care provider tells you not to do that. This information is not intended to replace advice given to you by your health care provider. Make sure you  discuss any questions you have with your health care provider. Document Revised: 07/12/2020 Document Reviewed: 07/12/2020 Elsevier Patient Education  2021 [employer].

## 2021-07-19 NOTE — Progress Notes (Signed)
Cardiology Office Note:    Date:  07/19/2021   ID:  Steven Payne, DOB 23-Dec-1945, MRN [id number]  PCP:  [name], NP  Cardiologist:  Jenean Lindau, MD   Referring MD: [name], NP    ASSESSMENT:    1. Atherosclerosis of native coronary artery of native heart with angina pectoris ([place])   2. Primary hypertension   3. Mixed hyperlipidemia   4. Diabetes mellitus due to underlying condition with unspecified complications ([place])   5. Ascending aorta dilatation ([facility])    PLAN:    In order of problems listed above:  Coronary artery disease: Secondary prevention stressed with the patient.  Importance of compliance with diet medication stressed any vocalized understanding. Essential hypertension: Blood pressure stable and diet was emphasized.  Lifestyle modification urged. Mixed dyslipidemia, diabetes mellitus and morbid obesity: Patient leads a sedentary lifestyle.  I cautioned him about this.  Importance of exercise stressed.  I told him to walk at least half an hour a day 5 days a week and he promises to do so.  Lipids are followed by primary care doctor. Cardiac murmur: Echocardiogram will be done to assess murmur heard on auscultation. Ascending aortic dilatation: We will do a CT scan to assess this and the progress of it.   Medication Adjustments/Labs and Tests Ordered: Current medicines are reviewed at length with the patient today.  Concerns regarding medicines are outlined above.  No orders of the defined types were placed in this encounter.  No orders of the defined types were placed in this encounter.    No chief complaint on file.    History of Present Illness:    Steven Payne is a 75 y.o. male.  Patient has atherosclerotic vascular disease.  He denies any problems at this time and takes care of activities of daily living.  No chest pain orthopnea or PND.  At the time of my evaluation, the patient is alert awake oriented and in no distress.  He  leads a sedentary lifestyle.  Past Medical History:  Diagnosis Date   Arthritis    Coronary artery disease cardiologist-  dr Irish Lack   per cardiac cath 06/ 2013--  no sig. 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   ??   HYDROCELE EXCISION Left 09/10/2016   Procedure: HYDROCELECTOMY ADULT;  Surgeon: Kathie Rhodes, MD;  Location: [place];  Service: Urology;  Laterality: Left;   UMBILICAL HERNIA REPAIR  1980's    Current Medications: Current Meds  Medication Sig   atorvastatin (LIPITOR) 40 MG tablet TAKE 1 TABLET DAILY   blood glucose meter kit and supplies KIT Dispense based on patient and insurance preference. Use up to four times daily  as directed. (FOR ICD-9 250.00, 250.01).   clopidogrel (PLAVIX) 75 MG tablet Take 1 tablet (75 mg total) by mouth daily.   colchicine 0.6 MG tablet Take 0.6 mg by mouth daily as needed (Gout).   diltiazem (TIAZAC) 300 MG 24 hr capsule Take 1 capsule (300 mg total) by mouth every morning.   DIOVAN HCT 160-25 MG tablet TAKE 1 TABLET EVERY EVENING   famotidine (PEPCID) 20 MG tablet Take 1 tablet (20 mg total) by mouth 2 (two) times daily.   FREESTYLE LITE test strip TEST UP TO FOUR TIMES DAILY   hydrocortisone (ANUSOL-HC) 25 MG suppository Place 1 suppository (25  mg total) rectally 2 (two) times daily as needed for hemorrhoids.   Lancets (FREESTYLE) lancets TEST UP TO FOUR TIMES DAILY   metFORMIN (GLUCOPHAGE) 1000 MG tablet TAKE 1 TABLET TWICE A DAY WITH MEALS ( THIS SHOULD REPLACE 500 MG TWICE A DAY ) (Patient taking differently: Take 1,000 mg by mouth daily with breakfast.)   Testosterone 1.62 % GEL Place onto the skin.     Allergies:   Tanzeum [albiglutide]   Social History   Socioeconomic History   Marital status: Married    Spouse name: Not on file   Number of children: Not on file   Years of education: Not on file   Highest education level: Not on file  Occupational History   Not on file  Tobacco Use   Smoking status: Former    Years: 2.00    Types: Cigarettes    Quit date: 03/27/1991    Years since quitting: 30.3   Smokeless tobacco: Never   Tobacco comments:    light smoker (smoked on 2 cigarettes per day for almost 2 years)  Substance and Sexual Activity   Alcohol use: No    Alcohol/week: 0.0 standard drinks   Drug use: No   Sexual activity: Not on file  Other Topics Concern   Not on file  Social History Narrative   Retired Corporate treasurer, 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  No grandchildren.  Oldest in [place], youngest in [place]   Completed bachelors, some graduate (Estate manager/land agent)   Enjoys travelling, walking, cruises   Social Determinants of Radio broadcast assistant Strain: Not on file  Food Insecurity: Not on file  Transportation Needs: Not on file  Physical Activity: Not on file  Stress: Not on file  Social Connections: Not on file     Family History: The patient's family history includes Alcohol abuse in his father and mother; Breast cancer in his paternal grandmother; Cancer in his mother; Coronary artery disease in his father and mother; Diabetes in his father; Prostate cancer in his father.  ROS:   Please see the  history of present illness.    All other systems reviewed and are negative.  EKGs/Labs/Other Studies Reviewed:    The following studies were reviewed today: EKG reveals sinus rhythm and nonspecific ST-T changes   Recent Labs: 05/24/2021: ALT 9; BUN 8; Creatinine, Ser 0.77; Potassium 3.7; Sodium 138  Recent Lipid Panel    Component Value Date/Time   CHOL 155 01/04/2021 0728   CHOL 151 08/25/2019 0918   TRIG 134.0 01/04/2021 0728   HDL 34.20 (L) 01/04/2021 0728   HDL 37 (L) 08/25/2019 0918   CHOLHDL 5 01/04/2021 0728   VLDL 26.8 01/04/2021 0728   LDLCALC 94 01/04/2021 0728  [place] 89 08/25/2019 0918    Physical Exam:    VS:  BP (!) 144/80 (BP Location: Right Arm, Patient Position: Sitting, Cuff Size: Large)   Pulse (!) 59   Ht $R'5\' 6"'MR$  (1.676 m)   Wt 261 lb (118.4 kg)   SpO2 98%   BMI 42.13 kg/m     Wt Readings from Last 3 Encounters:  07/19/21 261 lb (118.4 kg)  05/24/21 265 lb (120.2 kg)  05/02/21 261 lb 0.4 oz (118.4 kg)     GEN: Patient is in no acute distress HEENT: Normal NECK: No JVD; No carotid bruits LYMPHATICS: No lymphadenopathy CARDIAC: Hear sounds regular, 2/6 systolic murmur at the apex. RESPIRATORY:  Clear to auscultation without rales, wheezing or rhonchi  ABDOMEN: Soft, non-tender, non-distended MUSCULOSKELETAL:  No edema; No deformity  SKIN: Warm and dry NEUROLOGIC:  Alert and oriented x 3 PSYCHIATRIC:  Normal affect   Signed, Jenean Lindau, MD  07/19/2021 10:06 AM    [place]

## 2021-07-24 ENCOUNTER — Other Ambulatory Visit: Payer: Self-pay

## 2021-07-24 ENCOUNTER — Ambulatory Visit (HOSPITAL_BASED_OUTPATIENT_CLINIC_OR_DEPARTMENT_OTHER)
Admission: RE | Admit: 2021-07-24 | Discharge: 2021-07-24 | Disposition: A | Discharge status: Home / Self Care | Payer: Medicare Other | Source: Ambulatory Visit | Attending: Cardiology | Admitting: Cardiology

## 2021-07-24 DIAGNOSIS — I700 Atherosclerosis of aorta: Secondary | ICD-10-CM | POA: Diagnosis not present

## 2021-07-24 DIAGNOSIS — R931 Abnormal findings on diagnostic imaging of heart and coronary circulation: Secondary | ICD-10-CM | POA: Diagnosis not present

## 2021-07-24 DIAGNOSIS — I77810 Thoracic aortic ectasia: Secondary | ICD-10-CM | POA: Diagnosis not present

## 2021-07-24 NOTE — Addendum Note (Signed)
Addended by: Truddie Hidden on: 07/24/2021 03:54 PM   Modules accepted: Orders

## 2021-07-26 DIAGNOSIS — H524 Presbyopia: Secondary | ICD-10-CM | POA: Diagnosis not present

## 2021-07-26 DIAGNOSIS — H52223 Regular astigmatism, bilateral: Secondary | ICD-10-CM | POA: Diagnosis not present

## 2021-07-26 DIAGNOSIS — H2513 Age-related nuclear cataract, bilateral: Secondary | ICD-10-CM | POA: Diagnosis not present

## 2021-07-26 DIAGNOSIS — H04123 Dry eye syndrome of bilateral lacrimal glands: Secondary | ICD-10-CM | POA: Diagnosis not present

## 2021-07-26 DIAGNOSIS — E119 Type 2 diabetes mellitus without complications: Secondary | ICD-10-CM | POA: Diagnosis not present

## 2021-07-26 LAB — HM DIABETES EYE EXAM

## 2021-07-27 ENCOUNTER — Other Ambulatory Visit: Payer: Self-pay

## 2021-07-27 ENCOUNTER — Ambulatory Visit (HOSPITAL_COMMUNITY): Payer: Medicare Other | Attending: Cardiology

## 2021-07-27 DIAGNOSIS — I25119 Atherosclerotic heart disease of native coronary artery with unspecified angina pectoris: Secondary | ICD-10-CM | POA: Diagnosis not present

## 2021-07-27 DIAGNOSIS — I77810 Thoracic aortic ectasia: Secondary | ICD-10-CM | POA: Diagnosis not present

## 2021-07-27 LAB — ECHOCARDIOGRAM COMPLETE
Area-P 1/2: 2.09 cm2
Calc EF: 47 %
S' Lateral: 3 cm
Single Plane A2C EF: 56.3 %
Single Plane A4C EF: 44.4 %

## 2021-07-31 DIAGNOSIS — E782 Mixed hyperlipidemia: Secondary | ICD-10-CM | POA: Diagnosis not present

## 2021-08-01 ENCOUNTER — Other Ambulatory Visit: Payer: Self-pay

## 2021-08-01 DIAGNOSIS — E782 Mixed hyperlipidemia: Secondary | ICD-10-CM

## 2021-08-01 LAB — BASIC METABOLIC PANEL
BUN/Creatinine Ratio: 6 — ABNORMAL LOW (ref 10–24)
BUN: 5 mg/dL — ABNORMAL LOW (ref 8–27)
CO2: 22 mmol/L (ref 20–29)
Calcium: 9.3 mg/dL (ref 8.6–10.2)
Chloride: 103 mmol/L (ref 96–106)
Creatinine, Ser: 0.88 mg/dL (ref 0.76–1.27)
Glucose: 102 mg/dL — ABNORMAL HIGH (ref 65–99)
Potassium: 4.4 mmol/L (ref 3.5–5.2)
Sodium: 141 mmol/L (ref 134–144)
eGFR: 89 mL/min/{1.73_m2} (ref >59)

## 2021-08-01 LAB — HEPATIC FUNCTION PANEL
ALT: 11 IU/L (ref 0–44)
AST: 17 IU/L (ref 0–40)
Albumin: 4.5 g/dL (ref 3.7–4.7)
Alkaline Phosphatase: 102 IU/L (ref 44–121)
Bilirubin Total: 0.4 mg/dL (ref 0.0–1.2)
Bilirubin, Direct: 0.15 mg/dL (ref 0.00–0.40)
Total Protein: 7 g/dL (ref 6.0–8.5)

## 2021-08-01 LAB — LIPID PANEL
Chol/HDL Ratio: 4.1 ratio (ref 0.0–5.0)
Cholesterol, Total: 177 mg/dL (ref 100–199)
HDL: 43 mg/dL (ref >39)
LDL Chol Calc (NIH): 114 mg/dL — ABNORMAL HIGH (ref 0–99)
Triglycerides: 109 mg/dL (ref 0–149)
VLDL Cholesterol Cal: 20 mg/dL (ref 5–40)

## 2021-08-01 MED ORDER — ATORVASTATIN CALCIUM 80 MG PO TABS: 80.0000 mg | ORAL_TABLET | Freq: Every day | ORAL | 0 refills | Status: DC

## 2021-08-01 NOTE — Addendum Note (Signed)
Addended by: Findlay Dagher, Jonelle Sidle L on: 08/01/2021 03:42 PM   Modules accepted: Orders

## 2021-08-15 ENCOUNTER — Other Ambulatory Visit: Payer: Self-pay

## 2021-08-15 DIAGNOSIS — E785 Hyperlipidemia, unspecified: Secondary | ICD-10-CM | POA: Insufficient documentation

## 2021-08-15 DIAGNOSIS — I2510 Atherosclerotic heart disease of native coronary artery without angina pectoris: Secondary | ICD-10-CM | POA: Insufficient documentation

## 2021-08-15 DIAGNOSIS — I10 Essential (primary) hypertension: Secondary | ICD-10-CM | POA: Insufficient documentation

## 2021-08-17 ENCOUNTER — Encounter: Payer: Self-pay | Admitting: Cardiology

## 2021-08-17 ENCOUNTER — Ambulatory Visit (INDEPENDENT_AMBULATORY_CARE_PROVIDER_SITE_OTHER): Payer: Medicare Other | Admitting: Cardiology

## 2021-08-17 ENCOUNTER — Other Ambulatory Visit: Payer: Self-pay

## 2021-08-17 VITALS — BP 138/80 | HR 70 | Ht 66.0 in | Wt 263.1 lb

## 2021-08-17 DIAGNOSIS — I2510 Atherosclerotic heart disease of native coronary artery without angina pectoris: Secondary | ICD-10-CM | POA: Diagnosis not present

## 2021-08-17 DIAGNOSIS — I509 Heart failure, unspecified: Secondary | ICD-10-CM

## 2021-08-17 DIAGNOSIS — E782 Mixed hyperlipidemia: Secondary | ICD-10-CM

## 2021-08-17 DIAGNOSIS — I77810 Thoracic aortic ectasia: Secondary | ICD-10-CM | POA: Diagnosis not present

## 2021-08-17 DIAGNOSIS — E088 Diabetes mellitus due to underlying condition with unspecified complications: Secondary | ICD-10-CM

## 2021-08-17 DIAGNOSIS — G4733 Obstructive sleep apnea (adult) (pediatric): Secondary | ICD-10-CM | POA: Diagnosis not present

## 2021-08-17 MED ORDER — SACUBITRIL-VALSARTAN 24-26 MG PO TABS: 1.0000 | ORAL_TABLET | Freq: Two times a day (BID) | ORAL | 0 refills | Status: DC

## 2021-08-17 MED ORDER — FAMOTIDINE 20 MG PO TABS: 20.0000 mg | ORAL_TABLET | Freq: Two times a day (BID) | ORAL | 1 refills | Status: DC

## 2021-08-17 MED ORDER — ENTRESTO 24-26 MG PO TABS: 1.0000 | ORAL_TABLET | Freq: Two times a day (BID) | ORAL | 3 refills | Status: DC

## 2021-08-17 NOTE — Patient Instructions (Signed)
Medication Instructions:  Your physician has recommended you make the following change in your medication:   Stop Valsartan/HCTZ Start Entresto 24-26 mg twice daily.  Send [place] your BP log in 2 weeks via MyChart or mail.  *If you need a refill on your cardiac medications before your next appointment, please call your pharmacy*   Lab Work: Your physician recommends that you return for lab work in: 2 weeks for a BMET. You do not need to fast. You can come Monday through Friday 8:30 am to 12:00 pm and 1:15 to 4:30. You do not need to make an appointment as the order has already been placed.   Your physician recommends that you return for lab work in: 1 week before your next appointment. You need to have labs done when you are fasting.  You can come Monday through Friday 8:30 am to 12:00 pm and 1:15 to 4:30. You do not need to make an appointment as the order has already been placed. The labs you are going to have done are BMET, LFT and Lipids.   If you have labs (blood work) drawn today and your tests are completely normal, you will receive your results only by: [place] (if you have MyChart) OR A paper copy in the mail If you have any lab test that is abnormal or we need to change your treatment, we will call you to review the results.   Testing/Procedures: None ordered   Follow-Up: At [facility], you and your health needs are our priority.  As part of our continuing mission to provide you with exceptional heart care, we have created designated Provider Care Teams.  These Care Teams include your primary Cardiologist (physician) and Advanced Practice Providers (APPs -  Physician Assistants and Nurse Practitioners) who all work together to provide you with the care you need, when you need it.  We recommend signing up for the patient portal called "MyChart".  Sign up information is provided on this After Visit Summary.  MyChart is used to connect with patients for Virtual Visits  (Telemedicine).  Patients are able to view lab/test results, encounter notes, upcoming appointments, etc.  Non-urgent messages can be sent to your provider as well.   To learn more about what you can do with MyChart, go to [email or web address].    Your next appointment:   1-2 month(s)  The format for your next appointment:   In Person  Provider:   Jyl Heinz, MD   Other Instructions  Blood Pressure Record Sheet To take your blood pressure, you will need a blood pressure machine. You can buy a blood pressure machine (blood pressure monitor) at your clinic, drug store, or online. When choosing one, consider: An automatic monitor that has an arm cuff. A cuff that wraps snugly around your upper arm. You should be able to fit only one finger between your arm and the cuff. A device that stores blood pressure reading results. Do not choose a monitor that measures your blood pressure from your wrist or finger. Follow your health care provider's instructions for how to take your blood pressure. To use this form: Get one reading in the morning (a.m.) 1-2 hours after you take any medicines. Get one reading in the evening (p.m.) before supper. Take at least 2 readings with each blood pressure check. This makes sure the results are correct. Wait 1-2 minutes between measurements. Write down the results in the spaces on this form. Repeat this once a week, or as told by  your health care provider.  Make a follow-up appointment with your health care provider to discuss the results. Blood pressure log Date: _______________________ a.m. _____________________(1st reading) HR___________            p.m. _____________________(2nd reading) HR__________  Date: _______________________ a.m. _____________________(1st reading) HR___________            p.m. _____________________(2nd reading) HR__________ Date: _______________________ a.m. _____________________(1st reading) HR___________             p.m. _____________________(2nd reading) HR__________ Date: _______________________ a.m. _____________________(1st reading) HR___________            p.m. _____________________(2nd reading) HR__________  Date: _______________________ a.m. _____________________(1st reading) HR___________            p.m. _____________________(2nd reading) HR__________  Date: _______________________ a.m. _____________________(1st reading) HR___________            p.m. _____________________(2nd reading) HR__________  Date: _______________________ a.m. _____________________(1st reading) HR___________            p.m. _____________________(2nd reading) HR__________   This information is not intended to replace advice given to you by your health care provider. Make sure you discuss any questions you have with your health care provider. Document Revised: 03/09/2020 Document Reviewed: 03/09/2020 Elsevier Patient Education  2021 [employer].

## 2021-08-17 NOTE — Progress Notes (Signed)
Cardiology Office Note:    Date:  08/17/2021   ID:  Steven Payne, DOB 05-07-1946, MRN [id number]  PCP:  [name], NP  Cardiologist:  Jenean Lindau, MD   Referring MD: [name], NP    ASSESSMENT:    1. Coronary artery disease involving native coronary artery of native heart without angina pectoris   2. Ascending aorta dilatation (HCC)   3. Mixed hyperlipidemia   4. OSA (obstructive sleep apnea)   5. Diabetes mellitus due to underlying condition with unspecified complications ([place])   6. Morbid obesity ([place])    PLAN:    In order of problems listed above:  Coronary artery disease: Secondary prevention stressed with the patient.  Importance of compliance with diet medication stressed and vocalized understanding.  He was advised to walk at least half an hour a day 5 days a week and he promises to do so. Essential hypertension: Blood pressure stable and diet was emphasized.  Lifestyle modification urged. Cardiomyopathy with mildly depressed ejection fraction.  I will change valsartan to Entresto low-dose.  He will be back in 2 weeks for Chem-7 and will bring in a blood pressure log with him.  Benefits and potential risks of the medication explained and he vocalized understanding and questions were answered to his satisfaction. Mixed dyslipidemia: Diabetes mellitus and obesity: This emphasized lifestyle modification urged and he promises to do better.  He had multiple questions which were answered to his satisfaction. Patient will be seen in follow-up appointment in 6 months or earlier if the patient has any concerns    Medication Adjustments/Labs and Tests Ordered: Current medicines are reviewed at length with the patient today.  Concerns regarding medicines are outlined above.  No orders of the defined types were placed in this encounter.  No orders of the defined types were placed in this encounter.    No chief complaint on file.    History of Present  Illness:    Steven Payne is a 75 y.o. male.  Patient has past medical history of essential hypertension, mildly depressed ejection fraction, mixed dyslipidemia, coronary artery disease, ascending aortic aneurysm and obesity.  He denies any problems at this time and takes care of activities of daily living.  No chest pain orthopnea or PND.  At the time of my evaluation, the patient is alert awake oriented and in no distress.  Past Medical History:  Diagnosis Date   Arthritis    Ascending aorta dilatation ([place]) 07/19/2021   Atherosclerosis of native coronary artery of native heart with angina pectoris ([place]) 02/23/2016   Coronary artery disease cardiologist-  dr Irish Lack   per cardiac cath 06/ 2013--  no sig. 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/2019   Diabetes mellitus due to underlying condition with unspecified complications ([place]) [id number]   Diabetes type 2, controlled ([place]) 02/23/2016   ED (erectile dysfunction)    GERD (gastroesophageal reflux disease)    Gout 02/23/2016   History of COVID-19 05/24/2021   History of gout    approx 2002   HTN (hypertension) 02/23/2016   Hyperlipemia    Hyperlipidemia 02/23/2016   Hypertension    Hypogonadism male    Left hydrocele    Morbid obesity ([place]) 07/19/2021   OSA (obstructive sleep apnea) 05/24/2021   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   ??   HYDROCELE EXCISION Left 09/10/2016   Procedure: HYDROCELECTOMY ADULT;  Surgeon: Kathie Rhodes, MD;  Location: [facility];   Service: Urology;  Laterality: Left;   UMBILICAL HERNIA REPAIR  1980's    Current Medications: Current Meds  Medication Sig   atorvastatin (LIPITOR) 80 MG tablet Take 1 tablet (80 mg total) by mouth daily.   clopidogrel (PLAVIX) 75 MG tablet Take 1 tablet (75 mg total) by mouth daily.   colchicine 0.6 MG tablet Take 0.6 mg by mouth daily as needed for other (Gout). gout   diltiazem (TIAZAC) 300 MG 24 hr capsule Take 1 capsule (300 mg total) by mouth every morning.   famotidine (PEPCID) 20 MG tablet Take 1 tablet (20 mg total) by mouth 2 (two) times daily.   FREESTYLE LITE test strip TEST UP TO FOUR TIMES DAILY   Lancets (FREESTYLE) lancets TEST UP TO FOUR TIMES DAILY   metFORMIN (GLUCOPHAGE) 1000 MG tablet TAKE 1 TABLET TWICE A DAY WITH MEALS ( THIS SHOULD REPLACE 500 MG TWICE A DAY )   Testosterone 1.62 % GEL Place 1 application onto the skin daily.   valsartan-hydrochlorothiazide (DIOVAN-HCT) 160-25 MG tablet Take 1 tablet by mouth every evening.     Allergies:   Tanzeum [albiglutide]   Social History   Socioeconomic History   Marital status: Married    Spouse name: Not on file   Number of children: Not on file   Years of education: Not on file   Highest education level: Not on file  Occupational History   Not on file  Tobacco Use   Smoking status: Former    Years: 2.00    Types: Cigarettes    Quit date: 03/27/1991    Years since quitting: 30.4   Smokeless tobacco: Never   Tobacco comments:    light smoker (smoked on 2 cigarettes per day for almost 2 years)  Substance and Sexual Activity   Alcohol use: No    Alcohol/week: 0.0 standard drinks   Drug use: No   Sexual activity: Not on file  Other Topics Concern   Not on file  Social History Narrative   Retired Corporate treasurer, 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  No grandchildren.  Oldest in [place], youngest in [place]   Completed bachelors,  some graduate (Estate manager/land agent)   Enjoys travelling, walking, cruises   Social Determinants of Radio broadcast assistant Strain: Not on file  Food Insecurity: Not on file  Transportation Needs: Not on file  Physical Activity: Not on file  Stress: Not on file  Social Connections: Not on file     Family History: The patient's family history includes Alcohol abuse in his father and mother; Breast cancer in his paternal grandmother; Cancer in his mother; Coronary artery disease in his father and mother; Diabetes in his father; Prostate cancer in his father.  ROS:   Please see the history of present illness.    All other systems reviewed and are negative.  EKGs/Labs/Other Studies Reviewed:    The following studies were  reviewed today:  IMPRESSIONS:    1. Left ventricular ejection fraction, by estimation, is 45 to 50%. The  left ventricle has mildly decreased function. The left ventricle has no  regional wall motion abnormalities. There is mild left ventricular  hypertrophy. Left ventricular diastolic parameters are consistent with Grade I diastolic dysfunction (impaired relaxation).   2. Right ventricular systolic function is normal. The right ventricular  size is normal.   3. The mitral valve is normal in structure. Trivial mitral valve  regurgitation. No evidence of mitral stenosis.   4. 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 In  other views, the valve appears to have a partial fusion of the [place] and  LCC. Given his age of 75 years, I would doubt that this is a true bicuspid  AV. Suggest TEE if clinically indicated. . The aortic valve has an indeterminant number of cusps. There is mild calcification of the aortic valve. Aortic valve regurgitation is  trivial. No aortic stenosis is present.   5. Aortic dilatation noted. There is borderline dilatation of the  ascending aorta, measuring 39 mm.    IMPRESSION: 1. Normal caliber of the tubular  ascending thoracic aorta, measuring up to 3.7 x 3.7 cm. 2. Enlargement of the descending thoracic aorta, measuring up to 3.6 x 3.6 cm in the midportion of the vessel. Recommend annual imaging followup by CTA or MRA. This recommendation follows 2010 ACCF/AHA/AATS/ACR/ASA/SCA/SCAI/SIR/STS/SVM Guidelines for the Diagnosis and Management of Patients with Thoracic Aortic Disease. Circulation.2010; 121ML:4928372. Aortic aneurysm NOS (ICD10-I71.9) 3. Coronary artery disease.   Aortic Atherosclerosis (ICD10-I70.0).   Recent Labs: 07/31/2021: ALT 11; BUN 5; Creatinine, Ser 0.88; Potassium 4.4; Sodium 141  Recent Lipid Panel    Component Value Date/Time   CHOL 177 07/31/2021 0838   TRIG 109 07/31/2021 0838   HDL 43 07/31/2021 0838   CHOLHDL 4.1 07/31/2021 0838   CHOLHDL 5 01/04/2021 0728   VLDL 26.8 01/04/2021 0728   LDLCALC 114 (H) 07/31/2021 0838    Physical Exam:    VS:  BP 138/80   Pulse 70   Ht '5\' 6"'$  (1.676 m)   Wt 263 lb 1.3 oz (119.3 kg)   SpO2 98%   BMI 42.46 kg/m     Wt Readings from Last 3 Encounters:  08/17/21 263 lb 1.3 oz (119.3 kg)  07/19/21 261 lb (118.4 kg)  05/24/21 265 lb (120.2 kg)     GEN: Patient is in no acute distress HEENT: Normal NECK: No JVD; No carotid bruits LYMPHATICS: No lymphadenopathy CARDIAC: Hear sounds regular, 2/6 systolic murmur at the apex. RESPIRATORY:  Clear to auscultation without rales, wheezing or rhonchi  ABDOMEN: Soft, non-tender, non-distended MUSCULOSKELETAL:  No edema; No deformity  SKIN: Warm and dry NEUROLOGIC:  Alert and oriented x 3 PSYCHIATRIC:  Normal affect   Signed, Jenean Lindau, MD  08/17/2021 8:39 AM    [place]

## 2021-08-22 ENCOUNTER — Ambulatory Visit (INDEPENDENT_AMBULATORY_CARE_PROVIDER_SITE_OTHER): Payer: Medicare Other

## 2021-08-22 ENCOUNTER — Other Ambulatory Visit: Payer: Self-pay

## 2021-08-22 DIAGNOSIS — Z23 Encounter for immunization: Secondary | ICD-10-CM

## 2021-08-25 DIAGNOSIS — I509 Heart failure, unspecified: Secondary | ICD-10-CM | POA: Diagnosis not present

## 2021-08-25 DIAGNOSIS — I2510 Atherosclerotic heart disease of native coronary artery without angina pectoris: Secondary | ICD-10-CM | POA: Diagnosis not present

## 2021-08-26 LAB — BASIC METABOLIC PANEL
BUN/Creatinine Ratio: 10 (ref 10–24)
BUN: 7 mg/dL — ABNORMAL LOW (ref 8–27)
CO2: 22 mmol/L (ref 20–29)
Calcium: 9.6 mg/dL (ref 8.6–10.2)
Chloride: 102 mmol/L (ref 96–106)
Creatinine, Ser: 0.73 mg/dL — ABNORMAL LOW (ref 0.76–1.27)
Glucose: 85 mg/dL (ref 65–99)
Potassium: 4.2 mmol/L (ref 3.5–5.2)
Sodium: 139 mmol/L (ref 134–144)
eGFR: 94 mL/min/{1.73_m2} (ref >59)

## 2021-08-28 ENCOUNTER — Telehealth: Payer: Self-pay | Admitting: Cardiology

## 2021-08-28 NOTE — Telephone Encounter (Signed)
Results reviewed with pt as per Dr. Revankar's note.  Pt verbalized understanding and had no additional questions. Routed to PCP.  

## 2021-08-28 NOTE — Telephone Encounter (Signed)
Pt is returning call for results from recent labs on Friday

## 2021-09-01 ENCOUNTER — Other Ambulatory Visit: Payer: Self-pay | Admitting: Family

## 2021-09-05 ENCOUNTER — Other Ambulatory Visit: Payer: Self-pay | Admitting: Family

## 2021-09-18 ENCOUNTER — Ambulatory Visit: Payer: Medicare Other | Attending: Internal Medicine

## 2021-09-18 ENCOUNTER — Encounter: Payer: Self-pay | Admitting: Family

## 2021-09-18 DIAGNOSIS — Z23 Encounter for immunization: Secondary | ICD-10-CM

## 2021-09-18 NOTE — Progress Notes (Signed)
   Covid-19 Vaccination Clinic  Name:  SHEAMUS HASTING    MRN: [id number] DOB: October 13, 1946  09/18/2021  Mr. Lorincz was observed post Covid-19 immunization for 15 minutes without incident. He was provided with Vaccine Information Sheet and instruction to access the V-Safe system.   Mr. Derrick was instructed to call 911 with any severe reactions post vaccine: Difficulty breathing  Swelling of face and throat  A fast heartbeat  A bad rash all over body  Dizziness and weakness

## 2021-09-25 ENCOUNTER — Ambulatory Visit: Payer: TRICARE For Life (TFL) | Admitting: Family

## 2021-09-28 DIAGNOSIS — H04123 Dry eye syndrome of bilateral lacrimal glands: Secondary | ICD-10-CM | POA: Diagnosis not present

## 2021-09-28 DIAGNOSIS — E119 Type 2 diabetes mellitus without complications: Secondary | ICD-10-CM | POA: Diagnosis not present

## 2021-09-28 DIAGNOSIS — H2513 Age-related nuclear cataract, bilateral: Secondary | ICD-10-CM | POA: Diagnosis not present

## 2021-09-29 ENCOUNTER — Other Ambulatory Visit: Payer: Self-pay

## 2021-09-29 ENCOUNTER — Ambulatory Visit (INDEPENDENT_AMBULATORY_CARE_PROVIDER_SITE_OTHER): Payer: Medicare Other | Admitting: Family

## 2021-09-29 VITALS — BP 136/68 | HR 63 | Temp 98.4°F | Resp 16 | Wt 255.0 lb

## 2021-09-29 DIAGNOSIS — K219 Gastro-esophageal reflux disease without esophagitis: Secondary | ICD-10-CM | POA: Diagnosis not present

## 2021-09-29 DIAGNOSIS — R143 Flatulence: Secondary | ICD-10-CM

## 2021-09-29 DIAGNOSIS — I10 Essential (primary) hypertension: Secondary | ICD-10-CM

## 2021-09-29 DIAGNOSIS — G4733 Obstructive sleep apnea (adult) (pediatric): Secondary | ICD-10-CM | POA: Diagnosis not present

## 2021-09-29 DIAGNOSIS — E119 Type 2 diabetes mellitus without complications: Secondary | ICD-10-CM

## 2021-09-29 DIAGNOSIS — E785 Hyperlipidemia, unspecified: Secondary | ICD-10-CM | POA: Diagnosis not present

## 2021-09-29 DIAGNOSIS — I25119 Atherosclerotic heart disease of native coronary artery with unspecified angina pectoris: Secondary | ICD-10-CM

## 2021-09-29 DIAGNOSIS — Z8739 Personal history of other diseases of the musculoskeletal system and connective tissue: Secondary | ICD-10-CM

## 2021-09-29 HISTORY — DX: Flatulence: R14.3

## 2021-09-29 LAB — HEPATIC FUNCTION PANEL
ALT: 9 U/L (ref 0–53)
AST: 15 U/L (ref 0–37)
Albumin: 4.3 g/dL (ref 3.5–5.2)
Alkaline Phosphatase: 95 U/L (ref 39–117)
Bilirubin, Direct: 0.2 mg/dL (ref 0.0–0.3)
Total Bilirubin: 1 mg/dL (ref 0.2–1.2)
Total Protein: 7 g/dL (ref 6.0–8.3)

## 2021-09-29 LAB — HEMOGLOBIN A1C: Hgb A1c MFr Bld: 6.2 % (ref 4.6–6.5)

## 2021-09-29 LAB — LIPID PANEL
Cholesterol: 159 mg/dL (ref 0–200)
HDL: 39.8 mg/dL (ref >39.00)
LDL Cholesterol: 99 mg/dL (ref 0–99)
NonHDL: 118.84
Total CHOL/HDL Ratio: 4
Triglycerides: 97 mg/dL (ref 0.0–149.0)
VLDL: 19.4 mg/dL (ref 0.0–40.0)

## 2021-09-29 MED ORDER — METFORMIN HCL 1000 MG PO TABS: ORAL_TABLET | ORAL | 1 refills | Status: DC

## 2021-09-29 MED ORDER — COLCHICINE 0.6 MG PO TABS: 0.6000 mg | ORAL_TABLET | Freq: Every day | ORAL | 0 refills | Status: DC | PRN

## 2021-09-29 NOTE — Assessment & Plan Note (Addendum)
Will trial off of pepcid. Stable.   Wt Readings from Last 3 Encounters:  09/29/21 255 lb (115.7 kg)  08/17/21 263 lb 1.3 oz (119.3 kg)  07/19/21 261 lb (118.4 kg)

## 2021-09-29 NOTE — Progress Notes (Signed)
Subjective:   By signing my name below, I, Lyric Barr-McArthur, attest that this documentation has been prepared under the direction and in the presence of [name], NP, 09/29/2021   Patient ID: Steven Payne, male    DOB: December 14, 1945, 75 y.o.   MRN: [id number]  Chief Complaint  Patient presents with   Diabetes    Here for follow up   Bloated    Passing more gas than ussual    HPI Patient is in today for an office visit.  Gout: He mentions that his gout has not been bothering him. Alcohol: He has decreased his alcohol intake which has helped with his gout flare ups.  Medications: He is compliant in taking his testosterone gel and 80 mg lipitor. He uses a CPAP to help with his sleep at night. He is currently taking 0.6 mg colchicine and 20 mg pepcid as needed.  Diet: He notes that he has made dietary changes by increasing fiber in his diet but this lead to some unpleasant gas.  Blood sugar: He notes that his blood sugar has been within good range. His last A1C checked was recorded at 6.5. Lab Results  Component Value Date   HGBA1C 6.5 05/24/2021  Cholesterol: His cholesterol level was high last time he was in the office. He notes that his cardiologist increased his lipitor to 80 mg.  Lab Results  Component Value Date   CHOL 177 07/31/2021   HDL 43 07/31/2021   LDLCALC 114 (H) 07/31/2021   TRIG 109 07/31/2021   CHOLHDL 4.1 07/31/2021   Health Maintenance Due  Topic Date Due   OPHTHALMOLOGY EXAM  10/07/2020   FOOT EXAM  06/01/2021    Past Medical History:  Diagnosis Date   Arthritis    Ascending aorta dilatation ([place]) 07/19/2021   Atherosclerosis of native coronary artery of native heart with angina pectoris ([place]) 02/23/2016   Coronary artery disease cardiologist-  dr Irish Lack   per cardiac cath 06/ 2013--  no sig. 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   Diabetes mellitus due to underlying  condition with unspecified complications ([place]) 0/76/2263   Diabetes type 2, controlled ([place]) 02/23/2016   ED (erectile dysfunction)    GERD (gastroesophageal reflux disease)    Gout 02/23/2016   History of COVID-19 05/24/2021   History of gout    approx 2002   HTN (hypertension) 02/23/2016   Hyperlipemia    Hyperlipidemia 02/23/2016   Hypertension    Hypogonadism male    Left hydrocele    Morbid obesity ([place]) 07/19/2021   OSA (obstructive sleep apnea) 05/24/2021   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 dermatology and referred to plastics for wider excision    Past Surgical History:  Procedure Laterality Date   APPENDECTOMY  age 49's   CARDIAC CATHETERIZATION  05-08-2012  dr Elonda Husky at [facility]   no significant cad, sluggish ectatic coronary artery flow (rca, lad, lcx), normal LVSF, ef 60%   HEMORROIDECTOMY  1985 ?   ??   HYDROCELE EXCISION Left 09/10/2016   Procedure: HYDROCELECTOMY ADULT;  Surgeon: Kathie Rhodes, MD;  Location: [facility];  Service: Urology;  Laterality: Left;   UMBILICAL HERNIA REPAIR  30's    Family History  Problem Relation Age of Onset   Coronary artery disease Father    Diabetes Father  Prostate cancer Father        died at 72   Alcohol abuse Father    Coronary artery disease Mother        died age 30   Alcohol abuse Mother    Cancer Mother        unsure, + thyroid cancer   Breast cancer Paternal Grandmother     Social History   Socioeconomic History   Marital status: Married    Spouse name: Not on file   Number of children: Not on file   Years of education: Not on file   Highest education level: Not on file  Occupational History   Not on file  Tobacco Use   Smoking status: Former    Years: 2.00    Types: Cigarettes    Quit date: 03/27/1991    Years since quitting: 30.5   Smokeless tobacco: Never   Tobacco comments:    light smoker (smoked  on 2 cigarettes per day for almost 2 years)  Substance and Sexual Activity   Alcohol use: No    Alcohol/week: 0.0 standard drinks   Drug use: No   Sexual activity: Not on file  Other Topics Concern   Not on file  Social History Narrative   Retired Corporate treasurer, 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  No grandchildren.  Oldest in [place], youngest in [place]   Completed bachelors, some graduate (Estate manager/land agent)   Enjoys travelling, walking, cruises   Social Determinants of Radio broadcast assistant Strain: Not on file  Food Insecurity: Not on file  Transportation Needs: Not on file  Physical Activity: Not on file  Stress: Not on file  Social Connections: Not on file  Intimate Partner Violence: Not on file    Outpatient Medications Prior to Visit  Medication Sig Dispense Refill   atorvastatin (LIPITOR) 80 MG tablet Take 1 tablet (80 mg total) by mouth daily. 90 tablet 0   clopidogrel (PLAVIX) 75 MG tablet TAKE 1 TABLET DAILY 90 tablet 1   diltiazem (TIAZAC) 300 MG 24 hr capsule TAKE 1 CAPSULE EVERY MORNING 90 capsule 1   FREESTYLE LITE test strip TEST UP TO FOUR TIMES DAILY 300 strip 3   Lancets (FREESTYLE) lancets TEST UP TO FOUR TIMES DAILY 300 each 3   sacubitril-valsartan (ENTRESTO) 24-26 MG Take 1 tablet by mouth 2 (two) times daily. 180 tablet 3   Testosterone 1.62 % GEL Place 1 application onto the skin daily.     colchicine 0.6 MG tablet Take 0.6 mg by mouth daily as needed for other (Gout). gout     famotidine (PEPCID) 20 MG tablet Take 1 tablet (20 mg total) by mouth 2 (two) times daily. 180 tablet 1   metFORMIN (GLUCOPHAGE) 1000 MG tablet TAKE 1 TABLET TWICE A DAY WITH MEALS ( THIS SHOULD REPLACE 500 MG TWICE A DAY ) 180 tablet 1   sacubitril-valsartan (ENTRESTO) 24-26 MG Take 1 tablet by mouth 2 (two) times daily. 60 tablet 0   No facility-administered medications prior to visit.     Allergies  Allergen Reactions   Tanzeum [Albiglutide] Other (See Comments)    constipation    Review of Systems  Gastrointestinal:        (+) increased passing of gas      Objective:    Physical Exam Constitutional:      General: He is not  in acute distress.    Appearance: Normal appearance. He is not ill-appearing.  HENT:     Head: Normocephalic and atraumatic.     Right Ear: External ear normal.     Left Ear: External ear normal.  Eyes:     Extraocular Movements: Extraocular movements intact.     Pupils: Pupils are equal, round, and reactive to light.  Cardiovascular:     Rate and Rhythm: Normal rate and regular rhythm.     Heart sounds: Normal heart sounds. No murmur heard.   No gallop.  Pulmonary:     Effort: Pulmonary effort is normal. No respiratory distress.     Breath sounds: Normal breath sounds. No wheezing or rales.  Musculoskeletal:     Right lower leg: 1+ Edema present.     Left lower leg: 1+ Edema present.  Lymphadenopathy:     Cervical: No cervical adenopathy.  Skin:    General: Skin is warm and dry.  Neurological:     Mental Status: He is alert and oriented to person, place, and time.  Psychiatric:        Behavior: Behavior normal.        Judgment: Judgment normal.    BP 136/68 (BP Location: Right Arm, Patient Position: Sitting, Cuff Size: Large)   Pulse 63   Temp 98.4 F (36.9 C) (Oral)   Resp 16   Wt 255 lb (115.7 kg)   SpO2 99%   BMI 41.16 kg/m  Wt Readings from Last 3 Encounters:  09/29/21 255 lb (115.7 kg)  08/17/21 263 lb 1.3 oz (119.3 kg)  07/19/21 261 lb (118.4 kg)       Assessment & Plan:   Problem List Items Addressed This Visit       Unprioritized   OSA (obstructive sleep apnea)    Reports good compliance with CPAP but finds that he pulls it off in his sleep.       Hypertension    BP Readings from Last 3 Encounters:  09/29/21 136/68  08/17/21 138/80  07/19/21 (!) 144/80  bp stable.  Monitor.         Hyperlipidemia - Primary    Lab Results  Component Value Date   CHOL 177 07/31/2021   HDL 43 07/31/2021   LDLCALC 114 (H) 07/31/2021   TRIG 109 07/31/2021   CHOLHDL 4.1 07/31/2021         Relevant Orders   Lipid panel   Hepatic function panel   History of gout    Stable.  Continue colchicine on a prn basis.       GERD (gastroesophageal reflux disease)    Will trial off of pepcid. Stable.   Wt Readings from Last 3 Encounters:  09/29/21 255 lb (115.7 kg)  08/17/21 263 lb 1.3 oz (119.3 kg)  07/19/21 261 lb (118.4 kg)         Flatulence    New. Likely due to change in diet.  I provided him information on a low FODMAP diet.       Diabetes type 2, controlled ([place])    Lab Results  Component Value Date   HGBA1C 6.5 05/24/2021   HGBA1C 6.6 (H) 01/04/2021   HGBA1C 6.4 (H) 10/03/2020   Lab Results  Component Value Date   MICROALBUR 1.1 03/11/2017   LDLCALC 114 (H) 07/31/2021   CREATININE 0.73 (L) 08/25/2021  Stable on metformin 1000mg  bid.        Relevant Medications   metFORMIN (GLUCOPHAGE) 1000 MG tablet  Other Relevant Orders   Hemoglobin A1c   Meds ordered this encounter  Medications   metFORMIN (GLUCOPHAGE) 1000 MG tablet    Sig: TAKE 1 TABLET TWICE A DAY WITH MEALS ( THIS SHOULD REPLACE 500 MG TWICE A DAY )    Dispense:  180 tablet    Refill:  1   colchicine 0.6 MG tablet    Sig: Take 1 tablet (0.6 mg total) by mouth daily as needed (Gout). gout    Dispense:  20 tablet    Refill:  0    Order Specific Question:   Supervising Provider    Answer:   Penni Homans A [[id number]]   I, [name], NP, personally preformed the services described in this documentation.  All medical record entries made by the scribe were at my direction and in my presence.  I have reviewed the chart and discharge instructions (if applicable) and agree that the record reflects my personal performance and is accurate and complete. 09/29/2021  I,Lyric Barr-McArthur,acting as a  Education administrator for Nance Pear, NP.,have documented all relevant documentation on the behalf of Nance Pear, NP,as directed by  Nance Pear, NP while in the presence of Nance Pear, NP.  Nance Pear, NP

## 2021-09-29 NOTE — Patient Instructions (Signed)
Please complete lab work prior to leaving.   

## 2021-09-29 NOTE — Assessment & Plan Note (Signed)
Reports good compliance with CPAP but finds that he pulls it off in his sleep.

## 2021-09-29 NOTE — Assessment & Plan Note (Signed)
Lab Results  Component Value Date   HGBA1C 6.5 05/24/2021   HGBA1C 6.6 (H) 01/04/2021   HGBA1C 6.4 (H) 10/03/2020   Lab Results  Component Value Date   MICROALBUR 1.1 03/11/2017   LDLCALC 114 (H) 07/31/2021   CREATININE 0.73 (L) 08/25/2021   Stable on metformin 1000mg  bid.

## 2021-09-29 NOTE — Assessment & Plan Note (Signed)
New. Likely due to change in diet.  I provided him information on a low FODMAP diet.

## 2021-09-29 NOTE — Assessment & Plan Note (Signed)
Stable.  Continue colchicine on a prn basis.

## 2021-09-29 NOTE — Assessment & Plan Note (Signed)
Lab Results  Component Value Date   CHOL 177 07/31/2021   HDL 43 07/31/2021   LDLCALC 114 (H) 07/31/2021   TRIG 109 07/31/2021   CHOLHDL 4.1 07/31/2021

## 2021-09-29 NOTE — Assessment & Plan Note (Signed)
BP Readings from Last 3 Encounters:  09/29/21 136/68  08/17/21 138/80  07/19/21 (!) 144/80   bp stable.  Monitor.

## 2021-10-04 DIAGNOSIS — H2513 Age-related nuclear cataract, bilateral: Secondary | ICD-10-CM | POA: Diagnosis not present

## 2021-10-04 DIAGNOSIS — H2512 Age-related nuclear cataract, left eye: Secondary | ICD-10-CM | POA: Diagnosis not present

## 2021-10-12 ENCOUNTER — Other Ambulatory Visit: Payer: Self-pay | Admitting: Cardiology

## 2021-10-16 ENCOUNTER — Other Ambulatory Visit (HOSPITAL_BASED_OUTPATIENT_CLINIC_OR_DEPARTMENT_OTHER): Payer: Self-pay

## 2021-10-16 MED ORDER — PFIZER COVID-19 VAC BIVALENT 30 MCG/0.3ML IM SUSP
INTRAMUSCULAR | 0 refills | Status: DC
  Filled 2021-10-16: qty 0.3, 1d supply, fill #0

## 2021-10-31 ENCOUNTER — Encounter (HOSPITAL_BASED_OUTPATIENT_CLINIC_OR_DEPARTMENT_OTHER): Payer: Self-pay

## 2021-10-31 ENCOUNTER — Other Ambulatory Visit: Payer: Self-pay

## 2021-10-31 ENCOUNTER — Emergency Department (HOSPITAL_BASED_OUTPATIENT_CLINIC_OR_DEPARTMENT_OTHER)
Admission: EM | Admit: 2021-10-31 | Discharge: 2021-10-31 | Disposition: A | Discharge status: Home / Self Care | Payer: Medicare Other | Attending: Emergency Medicine | Admitting: Emergency Medicine

## 2021-10-31 DIAGNOSIS — R059 Cough, unspecified: Secondary | ICD-10-CM | POA: Diagnosis present

## 2021-10-31 DIAGNOSIS — Z87891 Personal history of nicotine dependence: Secondary | ICD-10-CM | POA: Insufficient documentation

## 2021-10-31 DIAGNOSIS — Z7984 Long term (current) use of oral hypoglycemic drugs: Secondary | ICD-10-CM | POA: Insufficient documentation

## 2021-10-31 DIAGNOSIS — I2510 Atherosclerotic heart disease of native coronary artery without angina pectoris: Secondary | ICD-10-CM | POA: Diagnosis not present

## 2021-10-31 DIAGNOSIS — E119 Type 2 diabetes mellitus without complications: Secondary | ICD-10-CM | POA: Diagnosis not present

## 2021-10-31 DIAGNOSIS — J069 Acute upper respiratory infection, unspecified: Secondary | ICD-10-CM

## 2021-10-31 DIAGNOSIS — Z8616 Personal history of COVID-19: Secondary | ICD-10-CM | POA: Insufficient documentation

## 2021-10-31 DIAGNOSIS — Z20822 Contact with and (suspected) exposure to covid-19: Secondary | ICD-10-CM | POA: Insufficient documentation

## 2021-10-31 DIAGNOSIS — Z79899 Other long term (current) drug therapy: Secondary | ICD-10-CM | POA: Insufficient documentation

## 2021-10-31 DIAGNOSIS — B9789 Other viral agents as the cause of diseases classified elsewhere: Secondary | ICD-10-CM | POA: Diagnosis not present

## 2021-10-31 DIAGNOSIS — I10 Essential (primary) hypertension: Secondary | ICD-10-CM | POA: Insufficient documentation

## 2021-10-31 LAB — RESP PANEL BY RT-PCR (FLU A&B, COVID) ARPGX2
Influenza A by PCR: NEGATIVE
Influenza B by PCR: NEGATIVE
SARS Coronavirus 2 by RT PCR: NEGATIVE

## 2021-10-31 NOTE — ED Provider Notes (Signed)
[facility] Provider Note   CSN: [id number] Arrival date & time: 10/31/21  0749     History Chief Complaint  Patient presents with   Cough    DOCTOR Payne is a 75 y.o. male.  The history is provided by the patient.  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   Diabetes mellitus due to underlying condition with unspecified complications ([place]) 6/57/8469   Diabetes type 2, controlled ([place]) 02/23/2016   ED (erectile dysfunction)    GERD (gastroesophageal reflux disease)    Gout 02/23/2016   History of COVID-19 05/24/2021   History of gout    approx 2002   HTN (hypertension) 02/23/2016   Hyperlipemia    Hyperlipidemia 02/23/2016   Hypertension    Hypogonadism male    Left hydrocele    Morbid obesity ([place]) 07/19/2021   OSA (obstructive sleep apnea) 05/24/2021   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, exceised by Magnus Sinning PA-C at [facility] dermatology and referred to plastics for wider excision    Patient Active  Problem List   Diagnosis Date Noted   Flatulence 09/29/2021   Coronary artery disease 08/15/2021   Hyperlipemia 08/15/2021   Hypertension 08/15/2021   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   Morbid obesity ([place]) 07/19/2021   Arthritis 07/17/2021   History of gout 07/17/2021   Hypogonadism male 07/17/2021   Left hydrocele 07/17/2021   OSA (obstructive sleep apnea) 05/24/2021   History of COVID-19 05/24/2021   Sweat gland tumor 2021   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   Diabetes mellitus due to underlying condition with unspecified complications ([place]) 62/95/2841   ED (erectile dysfunction) 03/11/2017   Diabetes type 2, controlled ([place]) 02/23/2016   HTN (hypertension) 02/23/2016   Hyperlipidemia 02/23/2016   Gout 02/23/2016   GERD (gastroesophageal reflux disease) 02/23/2016   Atherosclerosis of native coronary artery of native heart with angina pectoris ([place]) 02/23/2016    Past Surgical History:  Procedure Laterality Date   APPENDECTOMY  age 25's   CARDIAC CATHETERIZATION  05-08-2012  dr Elonda Husky at [facility]   no significant cad, sluggish ectatic coronary artery flow (rca, lad, lcx), normal LVSF, ef 60%   HEMORROIDECTOMY  1985 ?   ??   HYDROCELE EXCISION Left 09/10/2016   Procedure: HYDROCELECTOMY ADULT;  Surgeon: Kathie Rhodes, MD;  Location: [facility];  Service: Urology;  Laterality: Left;   UMBILICAL HERNIA REPAIR  11's       Family History  Problem Relation Age of Onset   Coronary artery disease Father  Diabetes Father    Prostate cancer Father        died at 32   Alcohol abuse Father    Coronary artery disease Mother        died age 2   Alcohol abuse Mother    Cancer Mother        unsure, + thyroid cancer   Breast cancer Paternal Grandmother     Social History   Tobacco Use   Smoking status: Former    Years: 2.00    Types: Cigarettes    Quit date: 03/27/1991    Years since quitting: 30.6   Smokeless tobacco: Never   Tobacco  comments:    light smoker (smoked on 2 cigarettes per day for almost 2 years)  Vaping Use   Vaping Use: Never used  Substance Use Topics   Alcohol use: No    Alcohol/week: 0.0 standard drinks   Drug use: No    Home Medications Prior to Admission medications   Medication Sig Start Date End Date Taking? Authorizing Provider  atorvastatin (LIPITOR) 80 MG tablet Take 1 tablet (80 mg total) by mouth daily. 10/12/21   Revankar, Reita Cliche, MD  clopidogrel (PLAVIX) 75 MG tablet TAKE 1 TABLET DAILY 09/06/21   [name], NP  colchicine 0.6 MG tablet Take 1 tablet (0.6 mg total) by mouth daily as needed (Gout). gout 09/29/21   [name], NP  COVID-19 mRNA bivalent vaccine, Pfizer, (PFIZER COVID-19 VAC BIVALENT) injection Inject into the muscle. 09/18/21   Carlyle Basques, MD  diltiazem [facility]) 300 MG 24 hr capsule TAKE 1 CAPSULE EVERY MORNING 09/01/21   [name], NP  FREESTYLE LITE test strip TEST UP TO FOUR TIMES DAILY 10/03/20   [name], NP  Lancets (FREESTYLE) lancets TEST UP TO FOUR TIMES DAILY 10/03/20   [name], NP  metFORMIN (GLUCOPHAGE) 1000 MG tablet TAKE 1 TABLET TWICE A DAY WITH MEALS ( THIS SHOULD REPLACE 500 MG TWICE A DAY ) 09/29/21   [name], NP  sacubitril-valsartan (ENTRESTO) 24-26 MG Take 1 tablet by mouth 2 (two) times daily. 08/17/21   Revankar, Reita Cliche, MD  Testosterone 1.62 % GEL Place 1 application onto the skin daily.    [provider]    Allergies    Tanzeum [albiglutide]  Review of Systems   Review of Systems  Constitutional:  Negative for chills, diaphoresis, fever and weight loss.  HENT:  Negative for ear pain, rhinorrhea and sore throat.   Eyes:  Negative for discharge.  Respiratory:  Positive for cough. Negative for shortness of breath and wheezing.   Cardiovascular:  Negative for chest pain.  Musculoskeletal:  Negative for myalgias.  Skin:  Negative for rash.  Neurological:  Negative for  headaches.   Physical Exam Updated Vital Signs BP (!) 159/83 (BP Location: Left Arm)   Pulse (!) 59   Temp 98.2 F (36.8 C) (Oral)   Resp 18   Ht 5\' 6"  (1.676 m)   Wt 114.3 kg   SpO2 98%   BMI 40.67 kg/m   Physical Exam Constitutional:      General: He is not in acute distress.    Appearance: He is not ill-appearing.  HENT:     Mouth/Throat:     Mouth: Mucous membranes are moist.  Cardiovascular:     Pulses: Normal pulses.  Pulmonary:     Effort: Pulmonary effort is normal.     Breath sounds: Normal breath sounds. No wheezing.  Neurological:  Mental Status: He is alert.    ED Results / Procedures / Treatments   Labs (all labs ordered are listed, but only abnormal results are displayed) Labs Reviewed  RESP PANEL BY RT-PCR (FLU A&B, COVID) ARPGX2    EKG None  Radiology No results found.  Procedures Procedures   Medications Ordered in ED Medications - No data to display  ED Course  I have reviewed the triage vital signs and the nursing notes.  Pertinent labs & imaging results that were available during my care of the patient were reviewed by me and considered in my medical decision making (see chart for details).    MDM Rules/Calculators/A&P                           [name] is here for COVID test after having 1+1 negative COVID test at home.  COVID test and flu test are negative here.  He has had a cough but no production or fever or other major symptoms.  Very well-appearing.  Given reassurance and discharged in the ED in good condition.  This chart was dictated using voice recognition software.  Despite best efforts to proofread,  errors can occur which can change the documentation meaning.    Final Clinical Impression(s) / ED Diagnoses Final diagnoses:  Viral URI with cough    Rx / DC Orders ED Discharge Orders     None        Lennice Sites, DO 10/31/21 1008

## 2021-10-31 NOTE — Discharge Instructions (Signed)
COVID and flu test are negative.

## 2021-10-31 NOTE — ED Triage Notes (Addendum)
Pt arrives ambulatory to ED with c/o cough states he got back from [place] Saturday and started developing a cough and runny nose. No other symptoms. States he took 2 home covid test one was positive, one was negative.

## 2021-11-04 ENCOUNTER — Emergency Department (HOSPITAL_BASED_OUTPATIENT_CLINIC_OR_DEPARTMENT_OTHER): Payer: Medicare Other

## 2021-11-04 ENCOUNTER — Other Ambulatory Visit: Payer: Self-pay

## 2021-11-04 ENCOUNTER — Emergency Department (HOSPITAL_BASED_OUTPATIENT_CLINIC_OR_DEPARTMENT_OTHER)
Admission: EM | Admit: 2021-11-04 | Discharge: 2021-11-04 | Disposition: A | Discharge status: Home / Self Care | Payer: Medicare Other | Attending: Emergency Medicine | Admitting: Emergency Medicine

## 2021-11-04 DIAGNOSIS — Z8616 Personal history of COVID-19: Secondary | ICD-10-CM | POA: Insufficient documentation

## 2021-11-04 DIAGNOSIS — I2510 Atherosclerotic heart disease of native coronary artery without angina pectoris: Secondary | ICD-10-CM | POA: Diagnosis not present

## 2021-11-04 DIAGNOSIS — Z79899 Other long term (current) drug therapy: Secondary | ICD-10-CM | POA: Insufficient documentation

## 2021-11-04 DIAGNOSIS — Z7901 Long term (current) use of anticoagulants: Secondary | ICD-10-CM | POA: Diagnosis not present

## 2021-11-04 DIAGNOSIS — B9789 Other viral agents as the cause of diseases classified elsewhere: Secondary | ICD-10-CM | POA: Diagnosis not present

## 2021-11-04 DIAGNOSIS — Z7984 Long term (current) use of oral hypoglycemic drugs: Secondary | ICD-10-CM | POA: Diagnosis not present

## 2021-11-04 DIAGNOSIS — Z20822 Contact with and (suspected) exposure to covid-19: Secondary | ICD-10-CM | POA: Insufficient documentation

## 2021-11-04 DIAGNOSIS — Z87891 Personal history of nicotine dependence: Secondary | ICD-10-CM | POA: Insufficient documentation

## 2021-11-04 DIAGNOSIS — I10 Essential (primary) hypertension: Secondary | ICD-10-CM | POA: Insufficient documentation

## 2021-11-04 DIAGNOSIS — J069 Acute upper respiratory infection, unspecified: Secondary | ICD-10-CM | POA: Insufficient documentation

## 2021-11-04 DIAGNOSIS — R059 Cough, unspecified: Secondary | ICD-10-CM | POA: Diagnosis not present

## 2021-11-04 DIAGNOSIS — E119 Type 2 diabetes mellitus without complications: Secondary | ICD-10-CM | POA: Diagnosis not present

## 2021-11-04 LAB — RESP PANEL BY RT-PCR (FLU A&B, COVID) ARPGX2
Influenza A by PCR: NEGATIVE
Influenza B by PCR: NEGATIVE
SARS Coronavirus 2 by RT PCR: NEGATIVE

## 2021-11-04 NOTE — ED Provider Notes (Signed)
[facility] Provider Note   CSN: [id number] Arrival date & time: 11/04/21  2993     History Chief Complaint  Patient presents with   Cough    Cold sx    [facility] is a 75 y.o. male.   Cough Cough characteristics:  Non-productive Sputum characteristics:  Nondescript Severity:  Mild Onset quality:  Gradual Duration:  6 days Timing:  Intermittent Progression:  Waxing and waning Context: upper respiratory infection   Relieved by:  Nothing Worsened by:  Nothing Associated symptoms: no chest pain, no chills, no diaphoresis, no ear fullness, no ear pain, no eye discharge, no fever, no headaches, no myalgias, no rash, no rhinorrhea, no shortness of breath, no sinus congestion, no sore throat, no weight loss and no wheezing       Past Medical History:  Diagnosis Date   Arthritis    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   Diabetes mellitus due to underlying condition with unspecified complications ([place]) 06/17/9677   Diabetes type 2, controlled ([place]) 02/23/2016   ED (erectile dysfunction)    GERD (gastroesophageal reflux disease)    Gout 02/23/2016   History of COVID-19 05/24/2021   History of gout    approx 2002   HTN (hypertension) 02/23/2016   Hyperlipemia    Hyperlipidemia 02/23/2016   Hypertension    Hypogonadism male    Left hydrocele    Morbid obesity ([place]) 07/19/2021   OSA (obstructive sleep apnea) 05/24/2021   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, exceised by Magnus Sinning PA-C at [facility] dermatology and referred to plastics for wider excision    Patient Active Problem List   Diagnosis  Date Noted   Flatulence 09/29/2021   Coronary artery disease 08/15/2021   Hyperlipemia 08/15/2021   Hypertension 08/15/2021   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   Morbid obesity ([place]) 07/19/2021   Arthritis 07/17/2021   History of gout 07/17/2021   Hypogonadism male 07/17/2021   Left hydrocele 07/17/2021   OSA (obstructive sleep apnea) 05/24/2021   History of COVID-19 05/24/2021   Sweat gland tumor 2021   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   Diabetes mellitus due to underlying condition with unspecified complications ([place]) 93/81/0175   ED (erectile dysfunction) 03/11/2017   Diabetes type 2, controlled ([place]) 02/23/2016   HTN (hypertension) 02/23/2016   Hyperlipidemia 02/23/2016   Gout 02/23/2016   GERD (gastroesophageal reflux disease) 02/23/2016   Atherosclerosis of native coronary artery of native heart with angina pectoris ([place]) 02/23/2016    Past Surgical History:  Procedure Laterality Date   APPENDECTOMY  age 45's   CARDIAC CATHETERIZATION  05-08-2012  dr Elonda Husky at [facility]   no significant cad, sluggish ectatic coronary artery flow (rca, lad, lcx), normal LVSF, ef 60%   HEMORROIDECTOMY  1985 ?   ??   HYDROCELE EXCISION Left 09/10/2016   Procedure: HYDROCELECTOMY ADULT;  Surgeon: Kathie Rhodes, MD;  Location: [facility];  Service: Urology;  Laterality: Left;   UMBILICAL HERNIA REPAIR  57's       Family History  Problem Relation Age of Onset   Coronary artery disease Father  Diabetes Father    Prostate cancer Father        died at 85   Alcohol abuse Father    Coronary artery disease Mother        died age 72   Alcohol abuse Mother    Cancer Mother        unsure, + thyroid cancer   Breast cancer Paternal Grandmother     Social History   Tobacco Use   Smoking status: Former    Years: 2.00    Types: Cigarettes    Quit date: 03/27/1991    Years since quitting: 30.6   Smokeless tobacco: Never   Tobacco comments:    light smoker  (smoked on 2 cigarettes per day for almost 2 years)  Vaping Use   Vaping Use: Never used  Substance Use Topics   Alcohol use: No    Alcohol/week: 0.0 standard drinks   Drug use: No    Home Medications Prior to Admission medications   Medication Sig Start Date End Date Taking? Authorizing Provider  atorvastatin (LIPITOR) 80 MG tablet Take 1 tablet (80 mg total) by mouth daily. 10/12/21   Revankar, Reita Cliche, MD  clopidogrel (PLAVIX) 75 MG tablet TAKE 1 TABLET DAILY 09/06/21   [name], NP  colchicine 0.6 MG tablet Take 1 tablet (0.6 mg total) by mouth daily as needed (Gout). gout 09/29/21   [name], NP  COVID-19 mRNA bivalent vaccine, Pfizer, (PFIZER COVID-19 VAC BIVALENT) injection Inject into the muscle. 09/18/21   Carlyle Basques, MD  diltiazem [facility]) 300 MG 24 hr capsule TAKE 1 CAPSULE EVERY MORNING 09/01/21   [name], NP  FREESTYLE LITE test strip TEST UP TO FOUR TIMES DAILY 10/03/20   [name], NP  Lancets (FREESTYLE) lancets TEST UP TO FOUR TIMES DAILY 10/03/20   [name], NP  metFORMIN (GLUCOPHAGE) 1000 MG tablet TAKE 1 TABLET TWICE A DAY WITH MEALS ( THIS SHOULD REPLACE 500 MG TWICE A DAY ) 09/29/21   [name], NP  sacubitril-valsartan (ENTRESTO) 24-26 MG Take 1 tablet by mouth 2 (two) times daily. 08/17/21   Revankar, Reita Cliche, MD  Testosterone 1.62 % GEL Place 1 application onto the skin daily.    [provider]    Allergies    Tanzeum [albiglutide]  Review of Systems   Review of Systems  Constitutional:  Negative for chills, diaphoresis, fever and weight loss.  HENT:  Negative for ear pain, rhinorrhea and sore throat.   Eyes:  Negative for discharge.  Respiratory:  Positive for cough. Negative for shortness of breath and wheezing.   Cardiovascular:  Negative for chest pain.  Musculoskeletal:  Negative for myalgias.  Skin:  Negative for rash.  Neurological:  Negative for headaches.   Physical  Exam Updated Vital Signs BP 129/77 (BP Location: Right Arm)   Pulse 78   Temp 98.4 F (36.9 C) (Oral)   Resp 20   SpO2 97%   Physical Exam Vitals and nursing note reviewed.  Constitutional:      General: He is not in acute distress.    Appearance: He is well-developed.  HENT:     Head: Normocephalic and atraumatic.  Eyes:     Conjunctiva/sclera: Conjunctivae normal.  Cardiovascular:     Rate and Rhythm: Normal rate and regular rhythm.     Pulses: Normal pulses.     Heart sounds: No murmur heard. Pulmonary:     Effort: Pulmonary effort is normal. No respiratory distress.  Breath sounds: Normal breath sounds.  Abdominal:     Palpations: Abdomen is soft.     Tenderness: There is no abdominal tenderness.  Musculoskeletal:        General: No swelling.     Cervical back: Neck supple.  Skin:    General: Skin is warm and dry.     Capillary Refill: Capillary refill takes less than 2 seconds.  Neurological:     Mental Status: He is alert.  Psychiatric:        Mood and Affect: Mood normal.    ED Results / Procedures / Treatments   Labs (all labs ordered are listed, but only abnormal results are displayed) Labs Reviewed  RESP PANEL BY RT-PCR (FLU A&B, COVID) ARPGX2    EKG None  Radiology DG Chest Portable 1 View  Result Date: 11/04/2021 CLINICAL DATA:  A 75 year old male presents for evaluation of cough. EXAM: PORTABLE CHEST 1 VIEW COMPARISON:  Comparison made with Apr 24, 2021. FINDINGS: The heart size and mediastinal contours are within normal limits. Both lungs are clear. The visualized skeletal structures are unremarkable. Lung base assessment mildly limited due to patient body habitus. IMPRESSION: Mildly limited exam due to portable technique and body habitus without acute cardiopulmonary findings. Electronically Signed   By: Zetta Bills M.D.   On: 11/04/2021 09:37    Procedures Procedures   Medications Ordered in ED Medications - No data to display  ED  Course  I have reviewed the triage vital signs and the nursing notes.  Pertinent labs & imaging results that were available during my care of the patient were reviewed by me and considered in my medical decision making (see chart for details).    MDM Rules/Calculators/A&P                           [name] is here for evaluation of cough.  Normal vitals.  No fever.  Patient was seen several days ago for the same was negative for flu and COVID.  Wife sick with similar symptoms.  Will get chest x-ray.  Will retest for flu and COVID.  Very well-appearing.  Chest x-ray negative for pneumonia.  Given reassurance.  Discharged in good condition.  Overall suspect viral process.  This chart was dictated using voice recognition software.  Despite best efforts to proofread,  errors can occur which can change the documentation meaning.   Final Clinical Impression(s) / ED Diagnoses Final diagnoses:  Viral URI with cough    Rx / DC Orders ED Discharge Orders     None        Lennice Sites, DO 11/04/21 1452

## 2021-11-04 NOTE — Discharge Instructions (Signed)
Follow-up viral testing online.  Overall suspect that you have a resolving viral process.

## 2021-11-04 NOTE — ED Triage Notes (Signed)
Pt returned from [place] recently, cough, runny nose, congestion.  States taking mucinex and tylenol.  States had poor sleeping last night due to cough.  Seen for same 2 days ago, states respiratory panel negative.

## 2021-11-07 DIAGNOSIS — H2512 Age-related nuclear cataract, left eye: Secondary | ICD-10-CM | POA: Diagnosis not present

## 2021-11-16 DIAGNOSIS — H2511 Age-related nuclear cataract, right eye: Secondary | ICD-10-CM | POA: Diagnosis not present

## 2021-11-28 DIAGNOSIS — H2511 Age-related nuclear cataract, right eye: Secondary | ICD-10-CM | POA: Diagnosis not present

## 2021-11-28 DIAGNOSIS — H25811 Combined forms of age-related cataract, right eye: Secondary | ICD-10-CM | POA: Diagnosis not present

## 2021-11-30 HISTORY — PX: CATARACT EXTRACTION: SUR2

## 2022-01-01 ENCOUNTER — Ambulatory Visit (INDEPENDENT_AMBULATORY_CARE_PROVIDER_SITE_OTHER): Payer: Medicare Other | Admitting: Family

## 2022-01-01 ENCOUNTER — Telehealth: Payer: Self-pay | Admitting: Family

## 2022-01-01 ENCOUNTER — Ambulatory Visit (INDEPENDENT_AMBULATORY_CARE_PROVIDER_SITE_OTHER): Payer: Medicare Other

## 2022-01-01 ENCOUNTER — Encounter: Payer: Self-pay | Admitting: Family

## 2022-01-01 VITALS — BP 136/70 | HR 62 | Temp 98.2°F | Resp 16 | Ht 66.0 in | Wt 260.0 lb

## 2022-01-01 VITALS — BP 136/70 | HR 62 | Temp 98.2°F | Resp 16 | Wt 260.0 lb

## 2022-01-01 DIAGNOSIS — G4733 Obstructive sleep apnea (adult) (pediatric): Secondary | ICD-10-CM | POA: Diagnosis not present

## 2022-01-01 DIAGNOSIS — E782 Mixed hyperlipidemia: Secondary | ICD-10-CM

## 2022-01-01 DIAGNOSIS — M1A9XX0 Chronic gout, unspecified, without tophus (tophi): Secondary | ICD-10-CM

## 2022-01-01 DIAGNOSIS — Z0000 Encounter for general adult medical examination without abnormal findings: Secondary | ICD-10-CM

## 2022-01-01 DIAGNOSIS — I10 Essential (primary) hypertension: Secondary | ICD-10-CM

## 2022-01-01 DIAGNOSIS — E119 Type 2 diabetes mellitus without complications: Secondary | ICD-10-CM | POA: Diagnosis not present

## 2022-01-01 DIAGNOSIS — E291 Testicular hypofunction: Secondary | ICD-10-CM | POA: Diagnosis not present

## 2022-01-01 DIAGNOSIS — I2510 Atherosclerotic heart disease of native coronary artery without angina pectoris: Secondary | ICD-10-CM | POA: Diagnosis not present

## 2022-01-01 DIAGNOSIS — N529 Male erectile dysfunction, unspecified: Secondary | ICD-10-CM | POA: Diagnosis not present

## 2022-01-01 LAB — BASIC METABOLIC PANEL
BUN: 5 mg/dL — ABNORMAL LOW (ref 6–23)
CO2: 24 mEq/L (ref 19–32)
Calcium: 8.9 mg/dL (ref 8.4–10.5)
Chloride: 106 mEq/L (ref 96–112)
Creatinine, Ser: 0.7 mg/dL (ref 0.40–1.50)
GFR: 89.06 mL/min (ref >60.00)
Glucose, Bld: 105 mg/dL — ABNORMAL HIGH (ref 70–99)
Potassium: 4 mEq/L (ref 3.5–5.1)
Sodium: 141 mEq/L (ref 135–145)

## 2022-01-01 LAB — HEMOGLOBIN A1C: Hgb A1c MFr Bld: 6.1 % (ref 4.6–6.5)

## 2022-01-01 MED ORDER — DILTIAZEM HCL ER BEADS 300 MG PO CP24: 300.0000 mg | ORAL_CAPSULE | Freq: Every morning | ORAL | 1 refills | Status: DC

## 2022-01-01 MED ORDER — METFORMIN HCL 1000 MG PO TABS: ORAL_TABLET | ORAL | 1 refills | Status: DC

## 2022-01-01 NOTE — Progress Notes (Signed)
Subjective:     Patient ID: Steven Payne, male    DOB: 04-05-1946, 76 y.o.   MRN: [id number]  Chief Complaint  Patient presents with   Diabetes    Here for follow up   Hyperlipidemia    Here for follow up    HPI Patient is in today for follow up.  CAD- maintained on plavix. He is maintained on entresto for cardiomyopathy.    Gout- on colchicine prn.    ED/Hypogonadism-   OSA- on CPAP.   DM2- He is eating less meat, more fruits/veggies.  Continues metformin bid. He walks at the gym 3 x a week for 1 hr.    Wt Readings from Last 3 Encounters:  01/01/22 260 lb (117.9 kg)  10/31/21 252 lb (114.3 kg)  09/29/21 255 lb (115.7 kg)    Lab Results  Component Value Date   HGBA1C 6.2 09/29/2021   HGBA1C 6.5 05/24/2021   HGBA1C 6.6 (H) 01/04/2021   Lab Results  Component Value Date   MICROALBUR 1.1 03/11/2017   LDLCALC 99 09/29/2021   CREATININE 0.73 (L) 08/25/2021   Hyperlipidemia- continues lipitor 80 mg.    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/2019   Diabetes mellitus due to underlying condition with unspecified complications ([place]) 7/67/3419   Diabetes type 2, controlled ([place]) 02/23/2016   ED (erectile dysfunction)    GERD (gastroesophageal reflux disease)    Gout 02/23/2016   History of COVID-19 05/24/2021   History of gout    approx 2002   HTN (hypertension) 02/23/2016    Hyperlipemia    Hyperlipidemia 02/23/2016   Hypertension    Hypogonadism male    Left hydrocele    Morbid obesity ([place]) 07/19/2021   OSA (obstructive sleep apnea) 05/24/2021   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 dermatology and referred to plastics for wider excision    Past Surgical History:  Procedure Laterality Date   APPENDECTOMY  age 22's   CARDIAC CATHETERIZATION  05-08-2012  dr Elonda Husky at [facility]   no significant cad, sluggish ectatic coronary artery flow (rca, lad, lcx), normal LVSF, ef 60%   CATARACT EXTRACTION  11/30/2021   HEMORROIDECTOMY  1985 ?   ??   HYDROCELE EXCISION Left 09/10/2016   Procedure: HYDROCELECTOMY ADULT;  Surgeon: Kathie Rhodes, MD;  Location: [facility];  Service: Urology;  Laterality: Left;   UMBILICAL HERNIA REPAIR  40's    Family History  Problem Relation Age of Onset   Coronary artery disease Father    Diabetes Father    Prostate cancer Father        died at 45   Alcohol abuse Father    Coronary artery disease Mother        died  age 39   Alcohol abuse Mother    Cancer Mother        unsure, + thyroid cancer   Breast cancer Paternal Grandmother     Social History   Socioeconomic History   Marital status: Married    Spouse name: Not on file   Number of children: Not on file   Years of education: Not on file   Highest education level: Not on file  Occupational History   Not on file  Tobacco Use   Smoking status: Former    Years: 2.00    Types: Cigarettes    Quit date: 03/27/1991    Years since quitting: 30.7   Smokeless tobacco: Never   Tobacco comments:    light smoker (smoked on 2 cigarettes per day for almost 2 years)  Vaping Use   Vaping Use: Never used  Substance and Sexual Activity   Alcohol use: No    Alcohol/week: 0.0 standard drinks   Drug use: No   Sexual activity: Not on file  Other Topics Concern   Not on file   Social History Narrative   Retired Corporate treasurer, 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  No grandchildren.  Oldest in [place], youngest in [place]   Completed bachelors, some graduate (Estate manager/land agent)   Enjoys travelling, walking, cruises   Social Determinants of Radio broadcast assistant Strain: Not on file  Food Insecurity: Not on file  Transportation Needs: Not on file  Physical Activity: Not on file  Stress: Not on file  Social Connections: Not on file  Intimate Partner Violence: Not on file    Outpatient Medications Prior to Visit  Medication Sig Dispense Refill   atorvastatin (LIPITOR) 80 MG tablet Take 1 tablet (80 mg total) by mouth daily. 90 tablet 2   clopidogrel (PLAVIX) 75 MG tablet TAKE 1 TABLET DAILY 90 tablet 1   colchicine 0.6 MG tablet Take 1 tablet (0.6 mg total) by mouth daily as needed (Gout). gout 20 tablet 0   COVID-19 mRNA bivalent vaccine, Pfizer, (PFIZER COVID-19 VAC BIVALENT) injection Inject into the muscle. 0.3 mL 0   FREESTYLE LITE test strip TEST UP TO FOUR TIMES DAILY 300 strip 3   Lancets (FREESTYLE) lancets TEST UP TO FOUR TIMES DAILY 300 each 3   sacubitril-valsartan (ENTRESTO) 24-26 MG Take 1 tablet by mouth 2 (two) times daily. 180 tablet 3   Testosterone 1.62 % GEL Place 1 application onto the skin daily.     diltiazem (TIAZAC) 300 MG 24 hr capsule TAKE 1 CAPSULE EVERY MORNING 90 capsule 1   metFORMIN (GLUCOPHAGE) 1000 MG tablet TAKE 1 TABLET TWICE A DAY WITH MEALS ( THIS SHOULD REPLACE 500 MG TWICE A DAY ) 180 tablet 1   No facility-administered medications prior to visit.    Allergies  Allergen Reactions   Tanzeum [Albiglutide] Other (See Comments)    constipation    ROS See HPI    Objective:    Physical Exam Constitutional:      General: He is not in acute distress.    Appearance: He is well-developed.  HENT:     Head: Normocephalic and  atraumatic.  Cardiovascular:     Rate and Rhythm: Normal rate and regular rhythm.     Heart sounds: No murmur heard. Pulmonary:     Effort: Pulmonary effort is normal. No respiratory distress.  Breath sounds: Normal breath sounds. No wheezing or rales.  Skin:    General: Skin is warm and dry.  Neurological:     Mental Status: He is alert and oriented to person, place, and time.  Psychiatric:        Behavior: Behavior normal.        Thought Content: Thought content normal.   Diabetic Foot Exam - Simple   Simple Foot Form Diabetic Foot exam was performed with the following findings: Yes 01/01/2022  7:24 AM  Visual Inspection No deformities, no ulcerations, no other skin breakdown bilaterally: Yes Sensation Testing Intact to touch and monofilament testing bilaterally: Yes Pulse Check Posterior Tibialis and Dorsalis pulse intact bilaterally: Yes Comments     BP 136/70 (BP Location: Right Arm, Patient Position: Sitting, Cuff Size: Large)    Pulse 62    Temp 98.2 F (36.8 C) (Oral)    Resp 16    Wt 260 lb (117.9 kg)    SpO2 99%    BMI 41.97 kg/m  Wt Readings from Last 3 Encounters:  01/01/22 260 lb (117.9 kg)  10/31/21 252 lb (114.3 kg)  09/29/21 255 lb (115.7 kg)       Assessment & Plan:   Problem List Items Addressed This Visit       Unprioritized   OSA (obstructive sleep apnea)    Continues CPAP.        Hypogonadism male   Relevant Orders   Ambulatory referral to Urology   Hyperlipidemia    LDL at goal.  Maintained on atorvastatin. Continue same.       Relevant Medications   diltiazem (TIAZAC) 300 MG 24 hr capsule   HTN (hypertension)    BP Readings from Last 3 Encounters:  01/01/22 136/70  11/04/21 129/77  10/31/21 (!) 159/83  BP stable. Maintained on diltiazem/entresto. Monitor.       Relevant Medications   diltiazem (TIAZAC) 300 MG 24 hr capsule   Gout    No flares recently. Has colchicine on hand for prn use but has not needd.       ED  (erectile dysfunction)    Requesting referral to a different urology group.        Relevant Orders   Ambulatory referral to Urology   Diabetes type 2, controlled ([place]) - Primary   Relevant Medications   metFORMIN (GLUCOPHAGE) 1000 MG tablet   Other Relevant Orders   Basic metabolic panel   Hemoglobin A1c   Coronary artery disease    Clinically stable. Management per cardiology.       Relevant Medications   diltiazem (TIAZAC) 300 MG 24 hr capsule    I have changed Mitzi Hansen B. Hartt "Steven Payne"'s diltiazem. I am also having him maintain his Testosterone, FREESTYLE LITE, freestyle, Entresto, clopidogrel, colchicine, atorvastatin, Pfizer COVID-19 Vac Bivalent, and metFORMIN.  Meds ordered this encounter  Medications   diltiazem (TIAZAC) 300 MG 24 hr capsule    Sig: Take 1 capsule (300 mg total) by mouth every morning.    Dispense:  90 capsule    Refill:  1    Order Specific Question:   Supervising Provider    Answer:   Penni Homans A [[id number]]   metFORMIN (GLUCOPHAGE) 1000 MG tablet    Sig: TAKE 1 TABLET TWICE A DAY WITH MEALS ( THIS SHOULD REPLACE 500 MG TWICE A DAY )    Dispense:  180 tablet    Refill:  1    Order Specific Question:   Supervising Provider  Answer:   Penni Homans A [id number]

## 2022-01-01 NOTE — Assessment & Plan Note (Signed)
Requesting referral to a different urology group.

## 2022-01-01 NOTE — Telephone Encounter (Signed)
Medical Record Request for Continuity of Care faxed to [facility].

## 2022-01-01 NOTE — Progress Notes (Signed)
Subjective:   Steven Payne is a 76 y.o. male who presents for Medicare Annual/Subsequent preventive examination.  Review of Systems     Cardiac Risk Factors include: advanced age (>91men, >34 women);hypertension;male gender;diabetes mellitus;dyslipidemia;obesity (BMI >30kg/m2)     Objective:    Today's Vitals   01/01/22 0757  BP: 136/70  Pulse: 62  Resp: 16  Temp: 98.2 F (36.8 C)  SpO2: 99%  Weight: 260 lb (117.9 kg)  Height: 5\' 6"  (1.676 m)   Body mass index is 41.97 kg/m.  Advanced Directives 01/01/2022 11/04/2021 10/31/2021 05/02/2021 04/24/2021 06/01/2020 10/30/2017  Does Patient Have a Medical Advance Directive? No No No No Yes No No  Would patient like information on creating a medical advance directive? No - Patient declined No - Patient declined No - Patient declined No - Patient declined - No - Patient declined -    Current Medications (verified) Outpatient Encounter Medications as of 01/01/2022  Medication Sig   atorvastatin (LIPITOR) 80 MG tablet Take 1 tablet (80 mg total) by mouth daily.   clopidogrel (PLAVIX) 75 MG tablet TAKE 1 TABLET DAILY   colchicine 0.6 MG tablet Take 1 tablet (0.6 mg total) by mouth daily as needed (Gout). gout   COVID-19 mRNA bivalent vaccine, Pfizer, (PFIZER COVID-19 VAC BIVALENT) injection Inject into the muscle.   diltiazem (TIAZAC) 300 MG 24 hr capsule Take 1 capsule (300 mg total) by mouth every morning.   FREESTYLE LITE test strip TEST UP TO FOUR TIMES DAILY   Lancets (FREESTYLE) lancets TEST UP TO FOUR TIMES DAILY   metFORMIN (GLUCOPHAGE) 1000 MG tablet TAKE 1 TABLET TWICE A DAY WITH MEALS ( THIS SHOULD REPLACE 500 MG TWICE A DAY )   sacubitril-valsartan (ENTRESTO) 24-26 MG Take 1 tablet by mouth 2 (two) times daily.   Testosterone 1.62 % GEL Place 1 application onto the skin daily.   No facility-administered encounter medications on file as of 01/01/2022.    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   Diabetes mellitus due to underlying condition with unspecified complications ([place]) 01/14/9416   Diabetes type 2, controlled ([place]) 02/23/2016   ED (erectile dysfunction)    GERD (gastroesophageal reflux disease)    Gout 02/23/2016   History of COVID-19 05/24/2021   History of gout    approx 2002   HTN (hypertension) 02/23/2016   Hyperlipemia    Hyperlipidemia 02/23/2016   Hypertension    Hypogonadism male    Left hydrocele    Morbid obesity ([place]) 07/19/2021   OSA (obstructive sleep apnea) 05/24/2021   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   ??   HYDROCELE  EXCISION Left 09/10/2016   Procedure: HYDROCELECTOMY ADULT;  Surgeon: Kathie Rhodes, MD;  Location: [facility];  Service: Urology;  Laterality: Left;   UMBILICAL HERNIA REPAIR  60's   Family History  Problem Relation Age of Onset   Coronary artery disease Father    Diabetes Father    Prostate cancer Father        died at 29   Alcohol abuse Father    Coronary artery disease Mother        died age 2   Alcohol abuse Mother    Cancer Mother        unsure, + thyroid cancer   Breast cancer Paternal  Grandmother    Social History   Socioeconomic History   Marital status: Married    Spouse name: Not on file   Number of children: Not on file   Years of education: Not on file   Highest education level: Not on file  Occupational History   Not on file  Tobacco Use   Smoking status: Former    Years: 2.00    Types: Cigarettes    Quit date: 03/27/1991    Years since quitting: 30.7   Smokeless tobacco: Never   Tobacco comments:    light smoker (smoked on 2 cigarettes per day for almost 2 years)  Vaping Use   Vaping Use: Never used  Substance and Sexual Activity   Alcohol use: No    Alcohol/week: 0.0 standard drinks   Drug use: No   Sexual activity: Not on file  Other Topics Concern   Not on file  Social History Narrative   Retired Corporate treasurer, 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  No grandchildren.  Oldest in [place], youngest in [place]   Completed bachelors, some graduate (Estate manager/land agent)   Enjoys travelling, walking, cruises   Social Determinants of Radio broadcast assistant Strain: Low Risk    Difficulty of Paying Living Expenses: Not hard at all  Food Insecurity: No Food Insecurity   Worried About Charity fundraiser in the Last Year: Never true   Arboriculturist in the Last Year: Never true  Transportation Needs: No Transportation Needs   Lack of Transportation (Medical): No   Lack of Transportation (Non-Medical): No  Physical Activity: Sufficiently Active   Days of Exercise per Week: 3 days   Minutes of Exercise per Session: 60 min  Stress: No Stress Concern Present   Feeling of Stress : Not at all  Social Connections: Moderately Integrated   Frequency of Communication with Friends and Family: More than three times a week   Frequency of Social Gatherings with Friends and Family: Twice a week   Attends Religious Services: More than 4 times per year   Active Member of [employer]  or Organizations: No   Attends Music therapist: Never   Marital Status: Married    Tobacco Counseling Counseling given: Not Answered Tobacco comments: light smoker (smoked on 2 cigarettes per day for almost 2 years)   Clinical Intake:  Pre-visit preparation completed: Yes  Pain : No/denies pain     BMI - recorded: 41.97 Nutritional Status: BMI > 30  Obese Nutritional Risks: None Diabetes: Yes CBG done?: No Did pt. bring in CBG monitor from home?: No  How often do you need to have someone help you when you read instructions, pamphlets,  or other written materials from your doctor or pharmacy?: 1 - Never  Diabetes:  Is the patient diabetic?  Yes  If diabetic, was a CBG obtained today?  No  Did the patient bring in their glucometer from home?  No  How often do you monitor your CBG's? daily.   Financial Strains and Diabetes Management:  Are you having any financial strains with the device, your supplies or your medication? No .  Does the patient want to be seen by Chronic Care Management for management of their diabetes?  No  Would the patient like to be referred to a Nutritionist or for Diabetic Management?  No   Diabetic Exams:  Diabetic Eye Exam: Completed 12/2021 per patient  Diabetic Foot Exam: Completed 01/01/2022.   Interpreter Needed?: No  Information entered by :: Caroleen Hamman LPN   Activities of Daily Living In your present state of health, do you have any difficulty performing the following activities: 01/01/2022 12/31/2021  Hearing? N N  Vision? N N  Difficulty concentrating or making decisions? N N  Walking or climbing stairs? N N  Dressing or bathing? N N  Doing errands, shopping? N N  Preparing Food and eating ? N N  Using the Toilet? N N  In the past six months, have you accidently leaked urine? N N  Do you have problems with loss of bowel control? N N  Managing your Medications? N N  Managing your Finances? N N  Housekeeping or  managing your Housekeeping? N N  Some recent data might be hidden    Patient Care Team: [name], NP as PCP - General (Internal Medicine) Virgina Evener, OD as Referring Physician (Optometry)  Indicate any recent Medical Services you may have received from other than [facility] providers in the past year (date may be approximate).     Assessment:   This is a routine wellness examination for [place].  Hearing/Vision screen Vision Screening - Comments:: Last eye exam-12/2021-Dr. Glann  Dietary issues and exercise activities discussed: Current Exercise Habits: Home exercise routine, Type of exercise: walking, Time (Minutes): 60, Frequency (Times/Week): 3, Weekly Exercise (Minutes/Week): 180, Intensity: Mild, Exercise limited by: None identified   Goals Addressed             This Visit's Progress    Increase physical activity   On track    Has membership at [employer].  Pt has a personal plan to workout 3-4 days per week for at least  150 minutes/week.        Increase water intake   On track      Depression Screen PHQ 2/9 Scores 01/01/2022 05/24/2021 06/01/2020 09/16/2019 09/08/2018 11/06/2016 05/21/2016  PHQ - 2 Score 0 0 0 0 0 0 0    Fall Risk Fall Risk  01/01/2022 12/31/2021 05/24/2021 06/01/2020 09/08/2018  Falls in the past year? 0 0 0 1 No  Number falls in past yr: 0 0 0 0 -  Injury with Fall? 0 1 0 1 -  Risk for fall due to : - - No Fall Risks - -  Follow up Falls prevention discussed - Falls evaluation completed Education provided;Falls prevention discussed -    FALL RISK PREVENTION PERTAINING TO THE HOME:  Any stairs in or around the home? No  Home free of loose throw rugs in walkways, pet beds, electrical cords, etc? Yes  Adequate lighting in your home to reduce risk of falls? Yes   ASSISTIVE DEVICES UTILIZED TO PREVENT FALLS:  Life  alert? No  Use of a cane, walker or w/c? No  Grab bars in the bathroom? Yes  Shower chair or bench in shower? Yes  Elevated  toilet seat or a handicapped toilet? No   TIMED UP AND GO:  Was the test performed? Yes .  Length of time to ambulate 10 feet: 11 sec.   Gait steady and fast without use of assistive device  Cognitive Function: MMSE - Mini Mental State Exam 03/26/2016  Orientation to time 5  Orientation to Place 5  Registration 3  Attention/ Calculation 5  Recall 2  Language- name 2 objects 2  Language- repeat 1  Language- follow 3 step command 3  Language- read & follow direction 1  Write a sentence 1  Copy design 1  Total score 29        Immunizations Immunization History  Administered Date(s) Administered   Fluad Quad(high Dose 65+) 08/13/2019, 09/02/2020, 08/22/2021   Influenza, High Dose Seasonal PF 08/25/2015, 09/04/2016, 08/23/2017, 09/08/2018   PFIZER(Purple Top)SARS-COV-2 Vaccination 01/08/2020, 02/02/2020, 09/07/2020   Pfizer Covid-19 Vaccine Bivalent Booster 11yrs & up 09/18/2021, 09/18/2021   Pneumococcal Conjugate-13 11/06/2016   Pneumococcal Polysaccharide-23 11/23/2010, 09/08/2018   Td 11/06/2016   Zoster Recombinat (Shingrix) 07/15/2017, 10/29/2017    TDAP status: Up to date  Flu Vaccine status: Up to date  Pneumococcal vaccine status: Up to date  Covid-19 vaccine status: Completed vaccines  Qualifies for Shingles Vaccine? No   Zostavax completed No   Shingrix Completed?: Yes  Screening Tests Health Maintenance  Topic Date Due   OPHTHALMOLOGY EXAM  10/07/2020   HEMOGLOBIN A1C  03/30/2022   FOOT EXAM  01/01/2023   TETANUS/TDAP  11/06/2026   Pneumonia Vaccine 34+ Years old  Completed   INFLUENZA VACCINE  Completed   COVID-19 Vaccine  Completed   Hepatitis C Screening  Completed   Zoster Vaccines- Shingrix  Completed   HPV VACCINES  Aged Out   COLONOSCOPY (Pts 45-63yrs Insurance coverage will need to be confirmed)  Discontinued    Health Maintenance  Health Maintenance Due  Topic Date Due   OPHTHALMOLOGY EXAM  10/07/2020    Colorectal cancer  screening: Type of screening: Colonoscopy. Completed 07/2017. Repeat every 5 years  Lung Cancer Screening: (Low Dose CT Chest recommended if Age 10-80 years, 30 pack-year currently smoking OR have quit w/in 15years.) does not qualify.     Additional Screening:  Hepatitis C Screening: Completed 03/11/2017  Vision Screening: Recommended annual ophthalmology exams for early detection of glaucoma and other disorders of the eye. Is the patient up to date with their annual eye exam?  Yes  Who is the provider or what is the name of the office in which the patient attends annual eye exams? Dr. Eulas Post   Dental Screening: Recommended annual dental exams for proper oral hygiene  Community Resource Referral / Chronic Care Management: CRR required this visit?  No   CCM required this visit?  No      Plan:     I have personally reviewed and noted the following in the patients chart:   Medical and social history Use of alcohol, tobacco or illicit drugs  Current medications and supplements including opioid prescriptions. Patient is not currently taking opioid prescriptions. Functional ability and status Nutritional status Physical activity Advanced directives List of other physicians Hospitalizations, surgeries, and ER visits in previous 12 months Vitals Screenings to include cognitive, depression, and falls Referrals and appointments  In addition, I have reviewed and discussed with patient  certain preventive protocols, quality metrics, and best practice recommendations. A written personalized care plan for preventive services as well as general preventive health recommendations were provided to patient.     Marta Antu, LPN   3/83/3383  Nurse Health Advisor  Nurse Notes: None

## 2022-01-01 NOTE — Telephone Encounter (Signed)
Please call [facility] and request copy of DM eye exam.

## 2022-01-01 NOTE — Patient Instructions (Signed)
Steven Payne , Thank you for taking time to come for your Medicare Wellness Visit. I appreciate your ongoing commitment to your health goals. Please review the following plan we discussed and let me know if I can assist you in the future.   Screening recommendations/referrals: Colonoscopy: Completed 07/05/2017-Due 07/05/2022 Recommended yearly ophthalmology/optometry visit for glaucoma screening and checkup Recommended yearly dental visit for hygiene and checkup  Vaccinations: Influenza vaccine: Up to date Pneumococcal vaccine: Up to date Tdap vaccine: Up to date Shingles vaccine: Completed vaccines  Covid-19: Up to date  Advanced directives: Declined information today  Conditions/risks identified: See problem list  Next appointment: Follow up in one year for your annual wellness visit.   Preventive Care 34 Years and Older, Male Preventive care refers to lifestyle choices and visits with your health care provider that can promote health and wellness. What does preventive care include? A yearly physical exam. This is also called an annual well check. Dental exams once or twice a year. Routine eye exams. Ask your health care provider how often you should have your eyes checked. Personal lifestyle choices, including: Daily care of your teeth and gums. Regular physical activity. Eating a healthy diet. Avoiding tobacco and drug use. Limiting alcohol use. Practicing safe sex. Taking low doses of aspirin every day. Taking vitamin and mineral supplements as recommended by your health care provider. What happens during an annual well check? The services and screenings done by your health care provider during your annual well check will depend on your age, overall health, lifestyle risk factors, and family history of disease. Counseling  Your health care provider may ask you questions about your: Alcohol use. Tobacco use. Drug use. Emotional well-being. Home and relationship  well-being. Sexual activity. Eating habits. History of falls. Memory and ability to understand (cognition). Work and work Statistician. Screening  You may have the following tests or measurements: Height, weight, and BMI. Blood pressure. Lipid and cholesterol levels. These may be checked every 5 years, or more frequently if you are over 4 years old. Skin check. Lung cancer screening. You may have this screening every year starting at age 44 if you have a 30-pack-year history of smoking and currently smoke or have quit within the past 15 years. Fecal occult blood test (FOBT) of the stool. You may have this test every year starting at age 51. Flexible sigmoidoscopy or colonoscopy. You may have a sigmoidoscopy every 5 years or a colonoscopy every 10 years starting at age 4. Prostate cancer screening. Recommendations will vary depending on your family history and other risks. Hepatitis C blood test. Hepatitis B blood test. Sexually transmitted disease (STD) testing. Diabetes screening. This is done by checking your blood sugar (glucose) after you have not eaten for a while (fasting). You may have this done every 1-3 years. Abdominal aortic aneurysm (AAA) screening. You may need this if you are a current or former smoker. Osteoporosis. You may be screened starting at age 32 if you are at high risk. Talk with your health care provider about your test results, treatment options, and if necessary, the need for more tests. Vaccines  Your health care provider may recommend certain vaccines, such as: Influenza vaccine. This is recommended every year. Tetanus, diphtheria, and acellular pertussis (Tdap, Td) vaccine. You may need a Td booster every 10 years. Zoster vaccine. You may need this after age 66. Pneumococcal 13-valent conjugate (PCV13) vaccine. One dose is recommended after age 25. Pneumococcal polysaccharide (PPSV23) vaccine. One dose is recommended after  age 53. Talk to your health care  provider about which screenings and vaccines you need and how often you need them. This information is not intended to replace advice given to you by your health care provider. Make sure you discuss any questions you have with your health care provider. Document Released: 12/16/2015 Document Revised: 08/08/2016 Document Reviewed: 09/20/2015 Elsevier Interactive Patient Education  2017 [place] Prevention in the Home Falls can cause injuries. They can happen to people of all ages. There are many things you can do to make your home safe and to help prevent falls. What can I do on the outside of my home? Regularly fix the edges of walkways and driveways and fix any cracks. Remove anything that might make you trip as you walk through a door, such as a raised step or threshold. Trim any bushes or trees on the path to your home. Use bright outdoor lighting. Clear any walking paths of anything that might make someone trip, such as rocks or tools. Regularly check to see if handrails are loose or broken. Make sure that both sides of any steps have handrails. Any raised decks and porches should have guardrails on the edges. Have any leaves, snow, or ice cleared regularly. Use sand or salt on walking paths during winter. Clean up any spills in your garage right away. This includes oil or grease spills. What can I do in the bathroom? Use night lights. Install grab bars by the toilet and in the tub and shower. Do not use towel bars as grab bars. Use non-skid mats or decals in the tub or shower. If you need to sit down in the shower, use a plastic, non-slip stool. Keep the floor dry. Clean up any water that spills on the floor as soon as it happens. Remove soap buildup in the tub or shower regularly. Attach bath mats securely with double-sided non-slip rug tape. Do not have throw rugs and other things on the floor that can make you trip. What can I do in the bedroom? Use night lights. Make  sure that you have a light by your bed that is easy to reach. Do not use any sheets or blankets that are too big for your bed. They should not hang down onto the floor. Have a firm chair that has side arms. You can use this for support while you get dressed. Do not have throw rugs and other things on the floor that can make you trip. What can I do in the kitchen? Clean up any spills right away. Avoid walking on wet floors. Keep items that you use a lot in easy-to-reach places. If you need to reach something above you, use a strong step stool that has a grab bar. Keep electrical cords out of the way. Do not use floor polish or wax that makes floors slippery. If you must use wax, use non-skid floor wax. Do not have throw rugs and other things on the floor that can make you trip. What can I do with my stairs? Do not leave any items on the stairs. Make sure that there are handrails on both sides of the stairs and use them. Fix handrails that are broken or loose. Make sure that handrails are as long as the stairways. Check any carpeting to make sure that it is firmly attached to the stairs. Fix any carpet that is loose or worn. Avoid having throw rugs at the top or bottom of the stairs. If you do  have throw rugs, attach them to the floor with carpet tape. Make sure that you have a light switch at the top of the stairs and the bottom of the stairs. If you do not have them, ask someone to add them for you. What else can I do to help prevent falls? Wear shoes that: Do not have high heels. Have rubber bottoms. Are comfortable and fit you well. Are closed at the toe. Do not wear sandals. If you use a stepladder: Make sure that it is fully opened. Do not climb a closed stepladder. Make sure that both sides of the stepladder are locked into place. Ask someone to hold it for you, if possible. Clearly mark and make sure that you can see: Any grab bars or handrails. First and last steps. Where the  edge of each step is. Use tools that help you move around (mobility aids) if they are needed. These include: Canes. Walkers. Scooters. Crutches. Turn on the lights when you go into a dark area. Replace any light bulbs as soon as they burn out. Set up your furniture so you have a clear path. Avoid moving your furniture around. If any of your floors are uneven, fix them. If there are any pets around you, be aware of where they are. Review your medicines with your doctor. Some medicines can make you feel dizzy. This can increase your chance of falling. Ask your doctor what other things that you can do to help prevent falls. This information is not intended to replace advice given to you by your health care provider. Make sure you discuss any questions you have with your health care provider. Document Released: 09/15/2009 Document Revised: 04/26/2016 Document Reviewed: 12/24/2014 Elsevier Interactive Patient Education  2017 [employer].

## 2022-01-01 NOTE — Assessment & Plan Note (Signed)
No flares recently. Has colchicine on hand for prn use but has not needd.

## 2022-01-01 NOTE — Patient Instructions (Signed)
Please complete lab work prior to leaving.   

## 2022-01-01 NOTE — Assessment & Plan Note (Signed)
LDL at goal.  Maintained on atorvastatin. Continue same.

## 2022-01-01 NOTE — Assessment & Plan Note (Signed)
BP Readings from Last 3 Encounters:  01/01/22 136/70  11/04/21 129/77  10/31/21 (!) 159/83   BP stable. Maintained on diltiazem/entresto. Monitor.

## 2022-01-01 NOTE — Assessment & Plan Note (Signed)
-   Continues CPAP  

## 2022-01-01 NOTE — Assessment & Plan Note (Signed)
Clinically stable. Management per cardiology.  

## 2022-01-16 ENCOUNTER — Other Ambulatory Visit: Payer: Self-pay

## 2022-01-16 DIAGNOSIS — Z8739 Personal history of other diseases of the musculoskeletal system and connective tissue: Secondary | ICD-10-CM | POA: Insufficient documentation

## 2022-01-19 ENCOUNTER — Ambulatory Visit (INDEPENDENT_AMBULATORY_CARE_PROVIDER_SITE_OTHER): Payer: Medicare Other | Admitting: Cardiology

## 2022-01-19 ENCOUNTER — Other Ambulatory Visit: Payer: Self-pay

## 2022-01-19 ENCOUNTER — Encounter: Payer: Self-pay | Admitting: Cardiology

## 2022-01-19 VITALS — BP 162/86 | HR 64 | Ht 66.0 in | Wt 256.0 lb

## 2022-01-19 DIAGNOSIS — E782 Mixed hyperlipidemia: Secondary | ICD-10-CM

## 2022-01-19 DIAGNOSIS — G4733 Obstructive sleep apnea (adult) (pediatric): Secondary | ICD-10-CM | POA: Diagnosis not present

## 2022-01-19 DIAGNOSIS — I77810 Thoracic aortic ectasia: Secondary | ICD-10-CM

## 2022-01-19 DIAGNOSIS — E088 Diabetes mellitus due to underlying condition with unspecified complications: Secondary | ICD-10-CM | POA: Diagnosis not present

## 2022-01-19 DIAGNOSIS — I25119 Atherosclerotic heart disease of native coronary artery with unspecified angina pectoris: Secondary | ICD-10-CM | POA: Diagnosis not present

## 2022-01-19 DIAGNOSIS — I10 Essential (primary) hypertension: Secondary | ICD-10-CM

## 2022-01-19 NOTE — Progress Notes (Signed)
Cardiology Office Note:    Date:  01/19/2022   ID:  Steven Payne, DOB August 17, 1946, MRN [id number]  PCP:  [name], NP  Cardiologist:  Jenean Lindau, MD   Referring MD: [name], NP    ASSESSMENT:    1. Atherosclerosis of native coronary artery of native heart with angina pectoris ([place])   2. Primary hypertension   3. Morbid obesity ([place])   4. OSA (obstructive sleep apnea)   5. Diabetes mellitus due to underlying condition with unspecified complications ([place])   6. Mixed hyperlipidemia   7. Ascending aorta dilatation ([facility])    PLAN:    In order of problems listed above:  Coronary artery disease: Secondary prevention stressed with the patient.  Importance of compliance with diet medication stressed any vocalized understanding.  He was advised to continue his exercise and lose weight. Essential hypertension: Blood pressure stable and diet was emphasized.  He tells me that his blood pressure readings are better at home. Mixed dyslipidemia: On lipid-lowering therapy and plan lipids checked today.  Diet was emphasized.  Lifestyle modification urged.   Diabetes mellitus and morbid obesity.  I counseled the patient extensively about this and the risks explained and he plans to do better and promises to follow advised. Patient will be seen in follow-up appointment in 6 months or earlier if the patient has any concerns    Medication Adjustments/Labs and Tests Ordered: Current medicines are reviewed at length with the patient today.  Concerns regarding medicines are outlined above.  No orders of the defined types were placed in this encounter.  No orders of the defined types were placed in this encounter.    No chief complaint on file.    History of Present Illness:    Steven Payne is a 76 y.o. male.  Patient has past medical history of coronary artery disease, ascending aortic dilatation, essential hypertension dyslipidemia diabetes mellitus and obesity.   He denies any problems at this time and takes care of activities of daily living.  No chest pain orthopnea or PND.  [facility] and [facility] on a regular basis.  At the time of my evaluation, the patient is alert awake oriented and in no distress.  Past Medical History:  Diagnosis Date   Arthritis    Ascending aorta dilatation ([place]) 07/19/2021   Atherosclerosis of native coronary artery of native heart with angina pectoris ([place]) 02/23/2016   Coronary artery disease cardiologist-  dr Irish Lack   per cardiac cath 06/ 2013--  no sig. 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[place]) 05/01/2019   Diabetes mellitus due to underlying condition with unspecified complications ([place]) 74/25/9563   Diabetes type 2, controlled ([place]) 02/23/2016   ED (erectile dysfunction)    Flatulence 09/29/2021   GERD (gastroesophageal reflux disease)    Gout 02/23/2016   History of COVID-19 05/24/2021   History of gout    approx 2002   HTN (hypertension) 02/23/2016   Hyperlipidemia 02/23/2016   Hypogonadism male    Left hydrocele    Morbid obesity ([place]) 07/19/2021   OSA (obstructive sleep apnea) 05/24/2021   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   ??   HYDROCELE EXCISION Left 09/10/2016   Procedure: HYDROCELECTOMY ADULT;  Surgeon: Kathie Rhodes, MD;  Location: [facility];  Service: Urology;  Laterality: Left;   UMBILICAL HERNIA REPAIR  1980's    Current  Medications: Current Meds  Medication Sig   atorvastatin (LIPITOR) 80 MG tablet Take 1 tablet (80 mg total) by mouth daily.   clobetasol cream (TEMOVATE) 4.00 % Apply 1 application topically daily.   clopidogrel (PLAVIX) 75 MG tablet TAKE 1 TABLET DAILY   colchicine 0.6 MG tablet Take 1 tablet (0.6 mg total) by mouth daily as needed (Gout). gout   diltiazem (TIAZAC) 300 MG 24 hr capsule Take 1 capsule (300 mg total) by mouth every morning.   famotidine (PEPCID) 20 MG tablet Take 20 mg by mouth 2 (two) times daily.   FREESTYLE LITE test strip TEST UP TO FOUR TIMES DAILY   Lancets (FREESTYLE) lancets TEST UP TO FOUR TIMES DAILY   metFORMIN (GLUCOPHAGE) 1000 MG tablet TAKE 1 TABLET TWICE A DAY WITH MEALS ( THIS SHOULD REPLACE 500 MG TWICE A DAY )   prednisoLONE acetate (PRED FORTE) 1 % ophthalmic suspension Place 1 drop into both eyes 2 (two) times daily.   RESTASIS 0.05 % ophthalmic emulsion Place 1 drop into both eyes daily.   sacubitril-valsartan (ENTRESTO) 24-26 MG Take 1 tablet by mouth 2 (two) times daily.   Testosterone 1.62 % GEL Place 1 application onto the skin daily.     Allergies:   Tanzeum [albiglutide]   Social History   Socioeconomic History   Marital status: Married    Spouse name: Not on file   Number of children: Not on file   Years of education: Not on file   Highest education level: Not on file  Occupational History   Not on file  Tobacco Use   Smoking status: Former    Years: 2.00    Types: Cigarettes    Quit date: 03/27/1991    Years since quitting: 30.8   Smokeless tobacco: Never   Tobacco comments:    light smoker (smoked on 2 cigarettes per day for almost 2 years)  Vaping Use   Vaping Use: Never used  Substance and Sexual Activity   Alcohol use: No    Alcohol/week: 0.0 standard drinks   Drug use: No   Sexual activity: Not on file  Other Topics Concern   Not on file  Social History Narrative   Retired Corporate treasurer, 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  No grandchildren.  Oldest in [place], youngest in [place]   Completed bachelors, some graduate (Estate manager/land agent)   Enjoys travelling, walking, cruises   Social Determinants of Radio broadcast assistant Strain: Low Risk    Difficulty of Paying Living Expenses: Not hard at all  Food Insecurity: No Food Insecurity   Worried About Charity fundraiser in the Last Year: Never true   Arboriculturist in the Last Year: Never true  Transportation Needs: No Transportation Needs   Lack of Transportation (Medical): No   Lack of Transportation (Non-Medical): No  Physical Activity: Sufficiently Active  Days of Exercise per Week: 3 days   Minutes of Exercise per Session: 60 min  Stress: No Stress Concern Present   Feeling of Stress : Not at all  Social Connections: Moderately Integrated   Frequency of Communication with Friends and Family: More than three times a week   Frequency of Social Gatherings with Friends and Family: Twice a week   Attends Religious Services: More than 4 times per year   Active Member of [employer] or Organizations: No   Attends Music therapist: Never   Marital Status: Married     Family History: The patient's family history includes Alcohol abuse in his father and mother; Breast cancer in his paternal grandmother; Cancer in his mother; Coronary artery disease in his father and mother; Diabetes in his father; Prostate cancer in his father.  ROS:   Please see the history of present illness.    All other systems reviewed and are negative.  EKGs/Labs/Other Studies Reviewed:    The following studies were reviewed today: EKG has revealed sinus rhythm and nonspecific ST-T changes   Recent Labs: 09/29/2021: ALT 9 01/01/2022: BUN 5; Creatinine, Ser 0.70; Potassium 4.0; Sodium 141  Recent Lipid Panel    Component Value Date/Time   CHOL 159 09/29/2021 0848   CHOL 177  07/31/2021 0838   TRIG 97.0 09/29/2021 0848   HDL 39.80 09/29/2021 0848   HDL 43 07/31/2021 0838   CHOLHDL 4 09/29/2021 0848   VLDL 19.4 09/29/2021 0848   LDLCALC 99 09/29/2021 0848   LDLCALC 114 (H) 07/31/2021 0838    Physical Exam:    VS:  BP (!) 162/86    Pulse 64    Ht 5\' 6"  (1.676 m)    Wt 256 lb 0.6 oz (116.1 kg)    SpO2 99%    BMI 41.33 kg/m     Wt Readings from Last 3 Encounters:  01/19/22 256 lb 0.6 oz (116.1 kg)  01/01/22 260 lb (117.9 kg)  01/01/22 260 lb (117.9 kg)     GEN: Patient is in no acute distress HEENT: Normal NECK: No JVD; No carotid bruits LYMPHATICS: No lymphadenopathy CARDIAC: Hear sounds regular, 2/6 systolic murmur at the apex. RESPIRATORY:  Clear to auscultation without rales, wheezing or rhonchi  ABDOMEN: Soft, non-tender, non-distended MUSCULOSKELETAL:  No edema; No deformity  SKIN: Warm and dry NEUROLOGIC:  Alert and oriented x 3 PSYCHIATRIC:  Normal affect   Signed, Jenean Lindau, MD  01/19/2022 9:09 AM    [place]

## 2022-01-19 NOTE — Patient Instructions (Signed)
Medication Instructions:  Your physician recommends that you continue on your current medications as directed. Please refer to the Current Medication list given to you today.  *If you need a refill on your cardiac medications before your next appointment, please call your pharmacy*   Lab Work: Your physician recommends that you have labs done in the office today. Your test included  basic metabolic panel, liver function and lipids.  If you have labs (blood work) drawn today and your tests are completely normal, you will receive your results only by: [place] (if you have MyChart) OR A paper copy in the mail If you have any lab test that is abnormal or we need to change your treatment, we will call you to review the results.   Testing/Procedures: None ordered   Follow-Up: At [facility], you and your health needs are our priority.  As part of our continuing mission to provide you with exceptional heart care, we have created designated Provider Care Teams.  These Care Teams include your primary Cardiologist (physician) and Advanced Practice Providers (APPs -  Physician Assistants and Nurse Practitioners) who all work together to provide you with the care you need, when you need it.  We recommend signing up for the patient portal called "MyChart".  Sign up information is provided on this After Visit Summary.  MyChart is used to connect with patients for Virtual Visits (Telemedicine).  Patients are able to view lab/test results, encounter notes, upcoming appointments, etc.  Non-urgent messages can be sent to your provider as well.   To learn more about what you can do with MyChart, go to [email or web address].    Your next appointment:   9 month(s)  The format for your next appointment:   In Person  Provider:   Jyl Heinz, MD   Other Instructions NA

## 2022-01-20 LAB — BASIC METABOLIC PANEL
BUN/Creatinine Ratio: 9 — ABNORMAL LOW (ref 10–24)
BUN: 7 mg/dL — ABNORMAL LOW (ref 8–27)
CO2: 23 mmol/L (ref 20–29)
Calcium: 9.5 mg/dL (ref 8.6–10.2)
Chloride: 103 mmol/L (ref 96–106)
Creatinine, Ser: 0.77 mg/dL (ref 0.76–1.27)
Glucose: 90 mg/dL (ref 70–99)
Potassium: 4.4 mmol/L (ref 3.5–5.2)
Sodium: 141 mmol/L (ref 134–144)
eGFR: 92 mL/min/{1.73_m2} (ref >59)

## 2022-01-20 LAB — HEPATIC FUNCTION PANEL
ALT: 7 IU/L (ref 0–44)
AST: 14 IU/L (ref 0–40)
Albumin: 4.7 g/dL (ref 3.7–4.7)
Alkaline Phosphatase: 112 IU/L (ref 44–121)
Bilirubin Total: 0.5 mg/dL (ref 0.0–1.2)
Bilirubin, Direct: 0.14 mg/dL (ref 0.00–0.40)
Total Protein: 7 g/dL (ref 6.0–8.5)

## 2022-01-20 LAB — LIPID PANEL
Chol/HDL Ratio: 4 ratio (ref 0.0–5.0)
Cholesterol, Total: 177 mg/dL (ref 100–199)
HDL: 44 mg/dL (ref >39)
LDL Chol Calc (NIH): 111 mg/dL — ABNORMAL HIGH (ref 0–99)
Triglycerides: 121 mg/dL (ref 0–149)
VLDL Cholesterol Cal: 22 mg/dL (ref 5–40)

## 2022-01-22 ENCOUNTER — Telehealth: Payer: Self-pay

## 2022-01-22 NOTE — Telephone Encounter (Signed)
-----   Message from Jenean Lindau, MD sent at 01/22/2022  9:49 AM EST ----- Double statin diet and ll 6wks cc pcp Jenean Lindau, MD 01/22/2022 9:49 AM

## 2022-01-22 NOTE — Telephone Encounter (Signed)
Left message on patients voicemail to please return our call.   

## 2022-01-23 ENCOUNTER — Telehealth: Payer: Self-pay

## 2022-01-23 DIAGNOSIS — I2510 Atherosclerotic heart disease of native coronary artery without angina pectoris: Secondary | ICD-10-CM

## 2022-01-23 MED ORDER — EZETIMIBE 10 MG PO TABS: 10.0000 mg | ORAL_TABLET | Freq: Every day | ORAL | 3 refills | Status: DC

## 2022-01-23 NOTE — Telephone Encounter (Signed)
-----   Message from Jenean Lindau, MD sent at 01/22/2022  9:49 AM EST ----- Double statin diet and ll 6wks cc pcp Jenean Lindau, MD 01/22/2022 9:49 AM

## 2022-01-23 NOTE — Telephone Encounter (Signed)
Left message on patients voicemail to please return our call.   

## 2022-01-23 NOTE — Addendum Note (Signed)
Addended by: Resa Miner I on: 01/23/2022 12:34 PM   Modules accepted: Orders

## 2022-01-23 NOTE — Telephone Encounter (Signed)
Spoke with patient regarding results and recommendation. I did advise him that I am going to reach out to Dr. Geraldo Pitter to verify these recommendations, specifically the dose of atorvastatin.   Patient verbalizes understanding and is agreeable to plan of care. Advised patient to call back with any issues or concerns.

## 2022-01-23 NOTE — Telephone Encounter (Signed)
Spoke to the patient just now and let him know Dr. Julien Nordmann recommendations. He verbalizes understanding and thanks me for the call back.    Encouraged patient to call back with any questions or concerns.

## 2022-01-26 ENCOUNTER — Other Ambulatory Visit: Payer: Self-pay | Admitting: Family

## 2022-02-05 ENCOUNTER — Other Ambulatory Visit: Payer: Self-pay | Admitting: Family

## 2022-02-23 DIAGNOSIS — H40041 Steroid responder, right eye: Secondary | ICD-10-CM | POA: Diagnosis not present

## 2022-02-27 ENCOUNTER — Other Ambulatory Visit: Payer: Self-pay | Admitting: Family

## 2022-03-06 DIAGNOSIS — E291 Testicular hypofunction: Secondary | ICD-10-CM | POA: Diagnosis not present

## 2022-03-06 DIAGNOSIS — N400 Enlarged prostate without lower urinary tract symptoms: Secondary | ICD-10-CM | POA: Diagnosis not present

## 2022-03-06 DIAGNOSIS — N529 Male erectile dysfunction, unspecified: Secondary | ICD-10-CM | POA: Diagnosis not present

## 2022-03-30 DIAGNOSIS — E782 Mixed hyperlipidemia: Secondary | ICD-10-CM | POA: Diagnosis not present

## 2022-03-31 LAB — LIPID PANEL
Chol/HDL Ratio: 3.6 ratio (ref 0.0–5.0)
Cholesterol, Total: 158 mg/dL (ref 100–199)
HDL: 44 mg/dL (ref >39)
LDL Chol Calc (NIH): 96 mg/dL (ref 0–99)
Triglycerides: 97 mg/dL (ref 0–149)
VLDL Cholesterol Cal: 18 mg/dL (ref 5–40)

## 2022-03-31 LAB — HEPATIC FUNCTION PANEL
ALT: 6 IU/L (ref 0–44)
AST: 14 IU/L (ref 0–40)
Albumin: 4.4 g/dL (ref 3.7–4.7)
Alkaline Phosphatase: 99 IU/L (ref 44–121)
Bilirubin Total: 0.5 mg/dL (ref 0.0–1.2)
Bilirubin, Direct: 0.13 mg/dL (ref 0.00–0.40)
Total Protein: 6.8 g/dL (ref 6.0–8.5)

## 2022-03-31 LAB — BASIC METABOLIC PANEL
BUN/Creatinine Ratio: 8 — ABNORMAL LOW (ref 10–24)
BUN: 6 mg/dL — ABNORMAL LOW (ref 8–27)
CO2: 22 mmol/L (ref 20–29)
Calcium: 9.2 mg/dL (ref 8.6–10.2)
Chloride: 103 mmol/L (ref 96–106)
Creatinine, Ser: 0.76 mg/dL (ref 0.76–1.27)
Glucose: 98 mg/dL (ref 70–99)
Potassium: 4.3 mmol/L (ref 3.5–5.2)
Sodium: 140 mmol/L (ref 134–144)
eGFR: 93 mL/min/{1.73_m2} (ref >59)

## 2022-04-05 DIAGNOSIS — E119 Type 2 diabetes mellitus without complications: Secondary | ICD-10-CM | POA: Diagnosis not present

## 2022-04-05 DIAGNOSIS — H40041 Steroid responder, right eye: Secondary | ICD-10-CM | POA: Diagnosis not present

## 2022-04-05 DIAGNOSIS — H04123 Dry eye syndrome of bilateral lacrimal glands: Secondary | ICD-10-CM | POA: Diagnosis not present

## 2022-04-27 ENCOUNTER — Ambulatory Visit (INDEPENDENT_AMBULATORY_CARE_PROVIDER_SITE_OTHER): Payer: Medicare Other | Admitting: Family

## 2022-04-27 DIAGNOSIS — E119 Type 2 diabetes mellitus without complications: Secondary | ICD-10-CM | POA: Diagnosis not present

## 2022-04-27 DIAGNOSIS — I25119 Atherosclerotic heart disease of native coronary artery with unspecified angina pectoris: Secondary | ICD-10-CM | POA: Diagnosis not present

## 2022-04-27 DIAGNOSIS — Z8739 Personal history of other diseases of the musculoskeletal system and connective tissue: Secondary | ICD-10-CM | POA: Diagnosis not present

## 2022-04-27 DIAGNOSIS — E291 Testicular hypofunction: Secondary | ICD-10-CM | POA: Diagnosis not present

## 2022-04-27 DIAGNOSIS — E782 Mixed hyperlipidemia: Secondary | ICD-10-CM

## 2022-04-27 LAB — HEMOGLOBIN A1C: Hgb A1c MFr Bld: 6.1 % (ref 4.6–6.5)

## 2022-04-27 MED ORDER — METFORMIN HCL 1000 MG PO TABS: ORAL_TABLET | ORAL | 1 refills | Status: DC

## 2022-04-27 NOTE — Assessment & Plan Note (Signed)
Stable no recent flairs.

## 2022-04-27 NOTE — Assessment & Plan Note (Signed)
Maintained on testosterone gel per urology.

## 2022-04-27 NOTE — Patient Instructions (Signed)
Please complete lab work prior to leaving.   

## 2022-04-27 NOTE — Assessment & Plan Note (Signed)
Lab Results  Component Value Date   CHOL 158 03/30/2022   HDL 44 03/30/2022   LDLCALC 96 03/30/2022   TRIG 97 03/30/2022   CHOLHDL 3.6 03/30/2022   LDL at goal.  Continue atorvastatin.

## 2022-04-27 NOTE — Assessment & Plan Note (Signed)
Lab Results  Component Value Date   HGBA1C 6.1 01/01/2022   HGBA1C 6.2 09/29/2021   HGBA1C 6.5 05/24/2021   Lab Results  Component Value Date   MICROALBUR 1.1 03/11/2017   LDLCALC 96 03/30/2022   CREATININE 0.76 03/30/2022   Wt Readings from Last 3 Encounters:  04/27/22 252 lb (114.3 kg)  01/19/22 256 lb 0.6 oz (116.1 kg)  01/01/22 260 lb (117.9 kg)   Notes improved sugars at home. Continue metformin.  Check A1C.

## 2022-04-27 NOTE — Progress Notes (Signed)
Subjective:   By signing my name below, I, Carylon Perches, attest that this documentation has been prepared under the direction and in the presence of [name] NP, 04/27/2022   Patient ID: Steven Payne, male    DOB: [birth year], 76 y.o.   MRN: [id number]  Chief Complaint  Patient presents with   Diabetes    Here for follow up    Diabetes  Patient is in today for an office visit.   Blood Sugar - He has been recording his blood glucose levels regularly.  He states that his glucose levels are decreasing. Lab Results  Component Value Date   HGBA1C 6.1 01/01/2022   Cholesterol - As of today's visit, his cholesterol levels are normal. He is regularly seeing his cardiology specialist.  Lab Results  Component Value Date   CHOL 158 03/30/2022   HDL 44 03/30/2022   LDLCALC 96 03/30/2022   TRIG 97 03/30/2022   CHOLHDL 3.6 03/30/2022   Gout - He reports no issues with his gout as of recently. He has been maintaining a healthy diet.  Pain - He reports that he experiences some level of pain while walking. He walks about an hour three times a week.  Testosterone 1.62% Gel - He states that he is almost out of his testosterone gel.  There are no preventive care reminders to display for this patient.  Past Medical History:  Diagnosis Date   Arthritis    Ascending aorta dilatation ([place]) 07/19/2021   Atherosclerosis of native coronary artery of native heart with angina pectoris ([place]) 02/23/2016   Coronary artery disease cardiologist-  dr Irish Lack   per cardiac cath 06/ 2013--  no sig. 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   Diabetes mellitus due to underlying condition with unspecified complications ([place]) 12/45/8099   Diabetes type 2, controlled ([place]) 02/23/2016   ED (erectile dysfunction)    Flatulence 09/29/2021   GERD (gastroesophageal reflux disease)    Gout 02/23/2016   History of COVID-19  05/24/2021   History of gout    approx 2002   HTN (hypertension) 02/23/2016   Hyperlipidemia 02/23/2016   Hypogonadism male    Left hydrocele    Morbid obesity ([place]) 07/19/2021   OSA (obstructive sleep apnea) 05/24/2021   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   ??   HYDROCELE EXCISION Left 09/10/2016   Procedure: HYDROCELECTOMY ADULT;  Surgeon: Kathie Rhodes, MD;  Location: [facility];  Service: Urology;  Laterality: Left;   UMBILICAL HERNIA REPAIR  14's    Family History  Problem Relation Age of Onset   Coronary artery disease Father    Diabetes Father    Prostate cancer Father        died at 48   Alcohol abuse Father    Coronary artery disease Mother        died age 11   Alcohol abuse Mother    Cancer Mother  unsure, + thyroid cancer   Breast cancer Paternal Grandmother     Social History   Socioeconomic History   Marital status: Married    Spouse name: Not on file   Number of children: Not on file   Years of education: Not on file   Highest education level: Not on file  Occupational History   Not on file  Tobacco Use   Smoking status: Former    Years: 2.00    Types: Cigarettes    Quit date: 03/27/1991    Years since quitting: 31.1   Smokeless tobacco: Never   Tobacco comments:    light smoker (smoked on 2 cigarettes per day for almost 2 years)  Vaping Use   Vaping Use: Never used  Substance and Sexual Activity   Alcohol use: No    Alcohol/week: 0.0 standard drinks   Drug use: No   Sexual activity: Not  on file  Other Topics Concern   Not on file  Social History Narrative   Retired Corporate treasurer, 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  No grandchildren.  Oldest in [place], youngest in [place]   Completed bachelors, some graduate (Estate manager/land agent)   Enjoys travelling, walking, cruises   Social Determinants of Radio broadcast assistant Strain: Low Risk    Difficulty of Paying Living Expenses: Not hard at all  Food Insecurity: No Food Insecurity   Worried About Charity fundraiser in the Last Year: Never true   Arboriculturist in the Last Year: Never true  Transportation Needs: No Transportation Needs   Lack of Transportation (Medical): No   Lack of Transportation (Non-Medical): No  Physical Activity: Sufficiently Active   Days of Exercise per Week: 3 days   Minutes of Exercise per Session: 60 min  Stress: No Stress Concern Present   Feeling of Stress : Not at all  Social Connections: Moderately Integrated   Frequency of Communication with Friends and Family: More than three times a week   Frequency of Social Gatherings with Friends and Family: Twice a week   Attends Religious Services: More than 4 times per year   Active Member of [employer] or Organizations: No   Attends Archivist Meetings: Never   Marital Status: Married  Human resources officer Violence: Not At Risk   Fear of Current or Ex-Partner: No   Emotionally Abused: No   Physically Abused: No   Sexually Abused: No    Outpatient Medications Prior to Visit  Medication Sig Dispense Refill   atorvastatin (LIPITOR) 80 MG tablet Take 1 tablet (80 mg total) by mouth daily. 90 tablet 2   clobetasol cream (TEMOVATE) 4.09 % Apply 1 application topically daily.     clopidogrel (PLAVIX) 75 MG tablet TAKE 1 TABLET DAILY 90 tablet 1   colchicine 0.6 MG tablet Take 1 tablet (0.6 mg total) by mouth daily as needed (Gout). gout 20 tablet 0    diltiazem (TIAZAC) 300 MG 24 hr capsule TAKE 1 CAPSULE EVERY MORNING 90 capsule 1   famotidine (PEPCID) 20 MG tablet TAKE 1 TABLET TWICE A DAY 180 tablet 1   FREESTYLE LITE test strip TEST UP TO FOUR TIMES DAILY 300 strip 3   Lancets (FREESTYLE) lancets TEST UP TO FOUR TIMES DAILY 300 each 3   RESTASIS 0.05 % ophthalmic emulsion Place 1 drop into both eyes daily.  sacubitril-valsartan (ENTRESTO) 24-26 MG Take 1 tablet by mouth 2 (two) times daily. 180 tablet 3   Testosterone 1.62 % GEL Place 1 application onto the skin daily.     metFORMIN (GLUCOPHAGE) 1000 MG tablet TAKE 1 TABLET TWICE A DAY WITH MEALS ( THIS SHOULD REPLACE 500 MG TWICE A DAY ) 180 tablet 1   ezetimibe (ZETIA) 10 MG tablet Take 1 tablet (10 mg total) by mouth daily. 90 tablet 3   prednisoLONE acetate (PRED FORTE) 1 % ophthalmic suspension Place 1 drop into both eyes 2 (two) times daily.     No facility-administered medications prior to visit.    Allergies  Allergen Reactions   Tanzeum [Albiglutide] Other (See Comments)    constipation    ROS     Objective:    Physical Exam Constitutional:      General: He is not in acute distress.    Appearance: Normal appearance. He is not ill-appearing.  HENT:     Head: Normocephalic and atraumatic.     Right Ear: External ear normal.     Left Ear: External ear normal.  Eyes:     Extraocular Movements: Extraocular movements intact.     Pupils: Pupils are equal, round, and reactive to light.  Cardiovascular:     Rate and Rhythm: Normal rate and regular rhythm.     Heart sounds: Normal heart sounds. No murmur heard.   No gallop.  Pulmonary:     Effort: Pulmonary effort is normal. No respiratory distress.     Breath sounds: Normal breath sounds. No wheezing or rales.  Skin:    General: Skin is warm and dry.  Neurological:     Mental Status: He is alert and oriented to person, place, and time.  Psychiatric:        Mood and Affect: Mood normal.        Behavior:  Behavior normal.        Judgment: Judgment normal.    BP (!) 143/70 (BP Location: Right Arm, Patient Position: Sitting, Cuff Size: Large)   Pulse 65   Temp 98.1 F (36.7 C) (Oral)   Resp 16   Wt 252 lb (114.3 kg)   SpO2 98%   BMI 40.67 kg/m  Wt Readings from Last 3 Encounters:  04/27/22 252 lb (114.3 kg)  01/19/22 256 lb 0.6 oz (116.1 kg)  01/01/22 260 lb (117.9 kg)       Assessment & Plan:   Problem List Items Addressed This Visit       Unprioritized   Hypogonadism male    Maintained on testosterone gel per urology.        Hyperlipidemia    Lab Results  Component Value Date   CHOL 158 03/30/2022   HDL 44 03/30/2022   LDLCALC 96 03/30/2022   TRIG 97 03/30/2022   CHOLHDL 3.6 03/30/2022  LDL at goal.  Continue atorvastatin.        History of gout    Stable no recent flairs.        Diabetes type 2, controlled ([place])    Lab Results  Component Value Date   HGBA1C 6.1 01/01/2022   HGBA1C 6.2 09/29/2021   HGBA1C 6.5 05/24/2021   Lab Results  Component Value Date   MICROALBUR 1.1 03/11/2017   LDLCALC 96 03/30/2022   CREATININE 0.76 03/30/2022   Wt Readings from Last 3 Encounters:  04/27/22 252 lb (114.3 kg)  01/19/22 256 lb 0.6 oz (116.1 kg)  01/01/22 260 lb (117.9  kg)  Notes improved sugars at home. Continue metformin.  Check A1C.      Relevant Medications   metFORMIN (GLUCOPHAGE) 1000 MG tablet   Other Relevant Orders   Hemoglobin A1c    Meds ordered this encounter  Medications   metFORMIN (GLUCOPHAGE) 1000 MG tablet    Sig: TAKE 1 TABLET TWICE A DAY WITH MEALS ( THIS SHOULD REPLACE 500 MG TWICE A DAY )    Dispense:  180 tablet    Refill:  1    I, Nance Pear, NP, personally preformed the services described in this documentation.  All medical record entries made by the scribe were at my direction and in my presence.  I have reviewed the chart and discharge instructions (if applicable) and agree that the record reflects my personal  performance and is accurate and complete. 04/27/2022   I,Amber Collins,acting as a scribe for Nance Pear, NP.,have documented all relevant documentation on the behalf of Nance Pear, NP,as directed by  Nance Pear, NP while in the presence of Nance Pear, NP.      Nance Pear, NP

## 2022-07-06 DIAGNOSIS — E119 Type 2 diabetes mellitus without complications: Secondary | ICD-10-CM | POA: Diagnosis not present

## 2022-07-06 DIAGNOSIS — H40041 Steroid responder, right eye: Secondary | ICD-10-CM | POA: Diagnosis not present

## 2022-07-06 DIAGNOSIS — Z961 Presence of intraocular lens: Secondary | ICD-10-CM | POA: Diagnosis not present

## 2022-07-06 DIAGNOSIS — H04123 Dry eye syndrome of bilateral lacrimal glands: Secondary | ICD-10-CM | POA: Diagnosis not present

## 2022-07-09 ENCOUNTER — Other Ambulatory Visit: Payer: Self-pay | Admitting: Cardiology

## 2022-07-25 ENCOUNTER — Other Ambulatory Visit: Payer: Self-pay | Admitting: Cardiology

## 2022-07-25 NOTE — Telephone Encounter (Signed)
Entresto 24-26 mg # 180 only with message patient needs appointment for future refills sent to [place], [place]

## 2022-07-27 ENCOUNTER — Ambulatory Visit (INDEPENDENT_AMBULATORY_CARE_PROVIDER_SITE_OTHER): Payer: Medicare Other | Admitting: Family

## 2022-07-27 VITALS — BP 140/92 | HR 93 | Resp 18 | Ht 66.0 in | Wt 255.0 lb

## 2022-07-27 DIAGNOSIS — H9319 Tinnitus, unspecified ear: Secondary | ICD-10-CM

## 2022-07-27 NOTE — Progress Notes (Signed)
Steven Payne is a 76 y.o. 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67 % Apply 1 application topically daily.     clopidogrel (PLAVIX) 75 MG tablet TAKE 1 TABLET DAILY 90 tablet 1   colchicine 0.6 MG tablet Take 1 tablet (0.6 mg total) by mouth daily as needed (Gout). gout 20 tablet 0   diltiazem (TIAZAC) 300 MG 24 hr capsule TAKE 1 CAPSULE EVERY MORNING 90 capsule 1   famotidine (PEPCID) 20 MG tablet TAKE 1 TABLET TWICE A DAY 180 tablet 1   FREESTYLE LITE test strip TEST UP TO FOUR TIMES DAILY 300 strip 3   Lancets (FREESTYLE) lancets TEST UP TO FOUR TIMES DAILY 300 each 3   metFORMIN (GLUCOPHAGE) 1000 MG tablet TAKE 1 TABLET TWICE A DAY WITH MEALS ( THIS SHOULD REPLACE 500 MG TWICE A DAY ) 180 tablet 1   RESTASIS 0.05 % ophthalmic emulsion Place 1 drop into both eyes daily.     sacubitril-valsartan (ENTRESTO) 24-26 MG Take 1 tablet by mouth 2 (two) times  daily. Needs appointment for future refills 180 tablet 0   Testosterone 1.62 % GEL Place 1 application onto the skin daily.     ezetimibe (ZETIA) 10 MG tablet Take 1 tablet (10 mg total) by mouth daily. 90 tablet 3   No current facility-administered medications for this visit.    Allergies: Tanzeum [albiglutide]  Past Medical History:  Diagnosis Date   Arthritis    Ascending aorta dilatation ([place]) 07/19/2021   Atherosclerosis of native coronary artery of native heart with angina pectoris ([place]) 02/23/2016   Coronary artery disease cardiologist-  dr Irish Lack   per cardiac cath 06/ 2013--  no sig. 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   Diabetes mellitus due to underlying condition with unspecified complications ([place]) 89/38/1017   Diabetes type 2, controlled ([place]) 02/23/2016   ED (erectile dysfunction)    Flatulence 09/29/2021   GERD (gastroesophageal reflux disease)    Gout 02/23/2016   History of COVID-19 05/24/2021   History of gout    approx 2002   HTN (hypertension) 02/23/2016   Hyperlipidemia 02/23/2016   Hypogonadism male    Left hydrocele    Morbid obesity ([place]) 07/19/2021   OSA (obstructive sleep apnea) 05/24/2021   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   ??   HYDROCELE EXCISION Left 09/10/2016   Procedure: HYDROCELECTOMY ADULT;  Surgeon: Kathie Rhodes, MD;  [facility]: [facility];  Service: Urology;  Laterality: Left;   UMBILICAL HERNIA REPAIR  27's     Family History  Problem Relation Age of Onset   Coronary artery disease Father    Diabetes Father    Prostate cancer Father        died at 8   Alcohol abuse Father    Coronary artery disease Mother        died age 37   Alcohol abuse Mother    Cancer Mother        unsure, + thyroid cancer   Breast cancer Paternal Grandmother     Social History   Tobacco Use   Smoking status: Former    Years: 2.00    Types: Cigarettes    Quit date: 03/27/1991    Years since quitting: 31.3   Smokeless tobacco: Never   Tobacco comments:    light smoker (smoked on 2 cigarettes per day for almost 2 years)  Substance Use Topics   Alcohol use: No    Alcohol/week: 0.0 standard drinks of alcohol    Subjective:   Presents with concerns for right ear ringing- describes as "gradual onset of symptoms." Notes that symptoms have been present for years and worsened in the past few months; "sounds like a machine in my right ear."  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  Head: Normocephalic and atraumatic  Eyes: Sclera and conjunctiva clear; pupils round and reactive to light; extraocular movements intact  Ears: External normal; canals clear; tympanic membranes normal  Lungs: Respirations unlabored; Neurologic: Alert and oriented; speech intact; face symmetrical; moves all extremities well; CNII-XII intact without focal deficit   Assessment:  1. Tinnitus, unspecified laterality     Plan:  Suspect damage secondary to time spent in the army; will refer to ENT for further evaluation; he will consider looking into disability through [place] as well.   No follow-ups on file.  Orders Placed This Encounter  Procedures   Ambulatory referral to ENT    Referral  Priority:   Routine    Referral Type:   Consultation    Referral Reason:   Specialty Services Required    Requested Specialty:   Otolaryngology    Number of Visits Requested:   1    Requested Prescriptions    No prescriptions requested or ordered in this encounter

## 2022-07-27 NOTE — Patient Instructions (Signed)
Tinnitus ?Tinnitus refers to hearing a sound when there is no actual source for that sound. This is often described as ringing in the ears. However, people with this condition may hear a variety of noises, in one ear or in both ears. ?The sounds of tinnitus can be soft, loud, or somewhere in between. Tinnitus can last for a few seconds or can be constant for days. It may go away without treatment and come back at various times. When tinnitus is constant or happens often, it can lead to other problems, such as trouble sleeping and trouble concentrating. ?Almost everyone experiences tinnitus at some point. Tinnitus is not the same as hearing loss. Tinnitus that is long-lasting (chronic) or comes back often (recurs) may require medical attention. ?What are the causes? ?The cause of tinnitus is often not known. In some cases, it can result from: ?Exposure to loud noises from machinery, music, or other sources. ?An object (foreign body) stuck in the ear. ?Earwax buildup. ?Drinking alcohol or caffeine. ?Taking certain medicines. ?Age-related hearing loss. ?It may also be caused by medical conditions such as: ?Ear or sinus infections. ?Heart diseases or high blood pressure. ?Allergies. ?M?ni?re's disease. ?Thyroid problems. ?Tumors. ?A weak, bulging blood vessel (aneurysm) near the ear. ?What increases the risk? ?The following factors may make you more likely to develop this condition: ?Exposure to loud noises. ?Age. Tinnitus is more likely in older individuals. ?Using alcohol or tobacco. ?What are the signs or symptoms? ?The main symptom of tinnitus is hearing a sound when there is no source for that sound. It may sound like: ?Buzzing. ?Sizzling. ?Ringing. ?Blowing air. ?Hissing. ?Whistling. ?Other sounds may include: ?Roaring. ?Running water. ?A musical note. ?Tapping. ?Humming. ?Symptoms may affect only one ear (unilateral) or both ears (bilateral). ?How is this diagnosed? ?Tinnitus is diagnosed based on your symptoms,  your medical history, and a physical exam. Your health care provider may do a thorough hearing test (audiologic exam) if your tinnitus: ?Is unilateral. ?Causes hearing difficulties. ?Lasts 6 months or longer. ?You may work with a health care provider who specializes in hearing disorders (audiologist). You may be asked questions about your symptoms and how they affect your daily life. You may have other tests done, such as: ?CT scan. ?MRI. ?An imaging test of how blood flows through your blood vessels (angiogram). ?How is this treated? ?Treating an underlying medical condition can sometimes make tinnitus go away. If your tinnitus continues, other treatments may include: ?Therapy and counseling to help you manage the stress of living with tinnitus. ?Sound generators to mask the tinnitus. These include: ?Tabletop sound machines that play relaxing sounds to help you fall asleep. ?Wearable devices that fit in your ear and play sounds or music. ?Acoustic neural stimulation. This involves using headphones to listen to music that contains an auditory signal. Over time, listening to this signal may change some pathways in your brain and make you less sensitive to tinnitus. This treatment is used for very severe cases when no other treatment is working. ?Using hearing aids or cochlear implants if your tinnitus is related to hearing loss. Hearing aids are worn in the outer ear. Cochlear implants are surgically placed in the inner ear. ?Follow these instructions at home: ?Managing symptoms ? ?  ? ?When possible, avoid being in loud places and being exposed to loud sounds. ?Wear hearing protection, such as earplugs, when you are exposed to loud noises. ?Use a white noise machine, a humidifier, or other devices to mask the sound of tinnitus. ?  Practice techniques for reducing stress, such as meditation, yoga, or deep breathing. Work with your health care provider if you need help with managing stress. ?Sleep with your head  slightly raised. This may reduce the impact of tinnitus. ?General instructions ?Do not use stimulants, such as nicotine, alcohol, or caffeine. Talk with your health care provider about other stimulants to avoid. Stimulants are substances that can make you feel alert and attentive by increasing certain activities in the body (such as heart rate and blood pressure). These substances may make tinnitus worse. ?Take over-the-counter and prescription medicines only as told by your health care provider. ?Try to get plenty of sleep each night. ?Keep all follow-up visits. This is important. ?Contact a health care provider if: ?Your tinnitus continues for 3 weeks or longer without stopping. ?You develop sudden hearing loss. ?Your symptoms get worse or do not get better with home care. ?You feel you are not able to manage the stress of living with tinnitus. ?Get help right away if: ?You develop tinnitus after a head injury. ?You have tinnitus along with any of the following: ?Dizziness. ?Nausea and vomiting. ?Loss of balance. ?Sudden, severe headache. ?Vision changes. ?Facial weakness or weakness of arms or legs. ?These symptoms may represent a serious problem that is an emergency. Do not wait to see if the symptoms will go away. Get medical help right away. Call your local emergency services (911 in the [place] Do not drive yourself to the hospital. ?Summary ?Tinnitus refers to hearing a sound when there is no actual source for that sound. This is often described as ringing in the ears. ?Symptoms may affect only one ear (unilateral) or both ears (bilateral). ?Use a white noise machine, a humidifier, or other devices to mask the sound of tinnitus. ?Do not use stimulants, such as nicotine, alcohol, or caffeine. These substances may make tinnitus worse. ?This information is not intended to replace advice given to you by your health care provider. Make sure you discuss any questions you have with your health care  provider. ?Document Revised: 10/24/2020 Document Reviewed: 10/24/2020 ?Elsevier Patient Education ? 2023 Elsevier Inc. ? ?

## 2022-08-06 ENCOUNTER — Other Ambulatory Visit: Payer: Self-pay | Admitting: Family

## 2022-08-27 ENCOUNTER — Other Ambulatory Visit: Payer: Self-pay | Admitting: Family

## 2022-08-29 ENCOUNTER — Telehealth: Payer: Self-pay | Admitting: Family

## 2022-08-29 ENCOUNTER — Ambulatory Visit (INDEPENDENT_AMBULATORY_CARE_PROVIDER_SITE_OTHER): Payer: Medicare Other | Admitting: Family

## 2022-08-29 VITALS — BP 142/80 | HR 65 | Temp 98.0°F | Resp 18 | Ht 66.0 in | Wt 251.4 lb

## 2022-08-29 DIAGNOSIS — R2689 Other abnormalities of gait and mobility: Secondary | ICD-10-CM

## 2022-08-29 DIAGNOSIS — E119 Type 2 diabetes mellitus without complications: Secondary | ICD-10-CM

## 2022-08-29 DIAGNOSIS — I10 Essential (primary) hypertension: Secondary | ICD-10-CM

## 2022-08-29 DIAGNOSIS — Z23 Encounter for immunization: Secondary | ICD-10-CM | POA: Diagnosis not present

## 2022-08-29 DIAGNOSIS — I77810 Thoracic aortic ectasia: Secondary | ICD-10-CM | POA: Diagnosis not present

## 2022-08-29 DIAGNOSIS — I25119 Atherosclerotic heart disease of native coronary artery with unspecified angina pectoris: Secondary | ICD-10-CM

## 2022-08-29 DIAGNOSIS — E782 Mixed hyperlipidemia: Secondary | ICD-10-CM | POA: Diagnosis not present

## 2022-08-29 DIAGNOSIS — E088 Diabetes mellitus due to underlying condition with unspecified complications: Secondary | ICD-10-CM | POA: Diagnosis not present

## 2022-08-29 LAB — BASIC METABOLIC PANEL
BUN: 8 mg/dL (ref 6–23)
CO2: 27 mEq/L (ref 19–32)
Calcium: 9.2 mg/dL (ref 8.4–10.5)
Chloride: 104 mEq/L (ref 96–112)
Creatinine, Ser: 0.74 mg/dL (ref 0.40–1.50)
GFR: 87.18 mL/min (ref >60.00)
Glucose, Bld: 89 mg/dL (ref 70–99)
Potassium: 4.2 mEq/L (ref 3.5–5.1)
Sodium: 140 mEq/L (ref 135–145)

## 2022-08-29 LAB — HEMOGLOBIN A1C: Hgb A1c MFr Bld: 6.3 % (ref 4.6–6.5)

## 2022-08-29 MED ORDER — CLOPIDOGREL BISULFATE 75 MG PO TABS
75.0000 mg | ORAL_TABLET | Freq: Every day | ORAL | 1 refills | Status: DC
Start: 2022-08-29 — End: 2023-02-22

## 2022-08-29 NOTE — Patient Instructions (Addendum)
Please call ENT to schedule an appointment:  [phone]

## 2022-08-29 NOTE — Assessment & Plan Note (Addendum)
Lab Results  Component Value Date   HGBA1C 6.1 04/27/2022   HGBA1C 6.1 01/01/2022   HGBA1C 6.2 09/29/2021   Lab Results  Component Value Date   MICROALBUR 1.1 03/11/2017   LDLCALC 96 03/30/2022   CREATININE 0.76 03/30/2022   Continues metformin. Last A1C at goal. Weight is down a few pounds.  Wt Readings from Last 3 Encounters:  08/29/22 251 lb 6.4 oz (114 kg)  07/27/22 255 lb (115.7 kg)  04/27/22 252 lb (114.3 kg)

## 2022-08-29 NOTE — Assessment & Plan Note (Addendum)
Lab Results  Component Value Date   CHOL 158 03/30/2022   HDL 44 03/30/2022   LDLCALC 96 03/30/2022   TRIG 97 03/30/2022   CHOLHDL 3.6 03/30/2022   Continues lipitor/zetia. LDL at goal.

## 2022-08-29 NOTE — Telephone Encounter (Signed)
Patient advised of orders for CT, he verbalized agreeing with plan of care

## 2022-08-29 NOTE — Telephone Encounter (Signed)
Please advise pt that he is also due for annual follow up imaging of his Aorta.  I have placed CT order.

## 2022-08-29 NOTE — Progress Notes (Signed)
Subjective:     Patient ID: Steven Payne, male    DOB: July 17, 1946, 76 y.o.   MRN: [id number]  Chief Complaint  Patient presents with   Hypertension   Leg Pain    Cramping in both     HPI Patient is in today for follow up. No complaints today.   DM2- reports home sugars have been stable.   HTN- reports home bp readings have been stable.    Hyperlipidemia- maintained on statin/zetia.   CAD- on plavix/statin     Health Maintenance Due  Topic Date Due   Diabetic kidney evaluation - Urine ACR  03/11/2018   COVID-19 Vaccine (5 - Pfizer risk series) 11/13/2021   OPHTHALMOLOGY EXAM  07/26/2022    Past Medical History:  Diagnosis Date   Arthritis    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   Atherosclerosis of native coronary artery of native heart with angina pectoris ([place]) 02/23/2016   Coronary artery disease cardiologist-  dr Irish Lack   per cardiac cath 06/ 2013--  no sig. 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/2019   Diabetes mellitus due to underlying condition with unspecified complications ([place]) 22/97/9892   Diabetes type 2, controlled ([place]) 02/23/2016   ED (erectile dysfunction)    Flatulence 09/29/2021   GERD (gastroesophageal reflux disease)    Gout 02/23/2016   History of COVID-19 05/24/2021   History of gout    approx 2002   HTN (hypertension) 02/23/2016   Hyperlipidemia 02/23/2016   Hypogonadism male    Left hydrocele    Morbid obesity ([place]) 07/19/2021   OSA (obstructive sleep apnea) 05/24/2021   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   ??   HYDROCELE EXCISION Left 09/10/2016   Procedure: HYDROCELECTOMY ADULT;  Surgeon: Kathie Rhodes, MD;  Location: [facility];  Service: Urology;  Laterality: Left;   UMBILICAL HERNIA REPAIR  21's    Family History  Problem Relation Age of Onset   Coronary artery disease Father    Diabetes Father    Prostate cancer Father        died at 69   Alcohol abuse Father    Coronary artery disease Mother        died age 73   Alcohol abuse Mother    Cancer Mother        unsure, + thyroid cancer   Breast cancer Paternal Grandmother     Social History   Socioeconomic History   Marital status: Married    Spouse name: Not on file   Number of children: Not on file   Years of education: Not on file   Highest education level: Not on file  Occupational History   Not on file  Tobacco Use   Smoking status: Former    Years: 2.00    Types: Cigarettes    Quit date: 03/27/1991    Years since quitting: 31.4   Smokeless tobacco: Never   Tobacco comments:  light smoker (smoked on 2 cigarettes per day for almost 2 years)  Vaping Use   Vaping Use: Never used  Substance and Sexual Activity   Alcohol use: No    Alcohol/week: 0.0 standard drinks of alcohol   Drug use: No   Sexual activity: Not on file  Other Topics Concern   Not on file  Social History Narrative   Retired Corporate treasurer, 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  No grandchildren.  Oldest in [place], youngest in [place]   Completed bachelors, some graduate (Estate manager/land agent)   Enjoys travelling, walking, cruises   Social Determinants of Health   Financial Resource Strain: [place]  (01/01/2022)   Overall Financial Resource Strain (CARDIA)    Difficulty of Paying Living Expenses: Not hard at all  Food Insecurity: No Food Insecurity  (01/01/2022)   Hunger Vital Sign    Worried About Running Out of Food in the Last Year: Never true    [place] in the Last Year: Never true  Transportation Needs: No Transportation Needs (01/01/2022)   PRAPARE - Hydrologist (Medical): No    Lack of Transportation (Non-Medical): No  Physical Activity: Sufficiently Active (01/01/2022)   Exercise Vital Sign    Days of Exercise per Week: 3 days    Minutes of Exercise per Session: 60 min  Stress: No Stress Concern Present (01/01/2022)   [place]    Feeling of Stress : Not at all  Social Connections: Moderately Integrated (01/01/2022)   Social Connection and Isolation Panel [NHANES]    Frequency of Communication with Friends and Family: More than three times a week    Frequency of Social Gatherings with Friends and Family: Twice a week    Attends Religious Services: More than 4 times per year    Active Member of [employer] or Organizations: No    Attends Archivist Meetings: Never    Marital Status: Married  Human resources officer Violence: Not At Risk (01/01/2022)   Humiliation, Afraid, Rape, and Kick questionnaire    Fear of Current or Ex-Partner: No    Emotionally Abused: No    Physically Abused: No    Sexually Abused: No    Outpatient Medications Prior to Visit  Medication Sig Dispense Refill   atorvastatin (LIPITOR) 80 MG tablet TAKE 1 TABLET DAILY 90 tablet 3   clobetasol cream (TEMOVATE) 1.28 % Apply 1 application topically daily.     colchicine 0.6 MG tablet Take 1 tablet (0.6 mg total) by mouth daily as needed (Gout). gout 20 tablet 0   diltiazem (TIAZAC) 300 MG 24 hr capsule TAKE 1 CAPSULE EVERY MORNING 90 capsule 3   famotidine (PEPCID) 20 MG tablet TAKE 1 TABLET TWICE A DAY 180 tablet 1   FREESTYLE LITE test strip TEST UP TO FOUR TIMES DAILY 300 strip 3   Lancets (FREESTYLE) lancets TEST UP TO FOUR TIMES DAILY 300 each 3    metFORMIN (GLUCOPHAGE) 1000 MG tablet TAKE 1 TABLET TWICE A DAY WITH MEALS ( THIS SHOULD REPLACE 500 MG TWICE A DAY ) 180 tablet 1   RESTASIS 0.05 % ophthalmic emulsion Place 1 drop into both eyes daily.     sacubitril-valsartan (ENTRESTO) 24-26 MG Take 1 tablet by mouth 2 (two) times daily. Needs appointment for future refills 180 tablet 0  Testosterone 1.62 % GEL Place 1 application onto the skin daily.     clopidogrel (PLAVIX) 75 MG tablet TAKE 1 TABLET DAILY 90 tablet 1   ezetimibe (ZETIA) 10 MG tablet Take 1 tablet (10 mg total) by mouth daily. 90 tablet 3   No facility-administered medications prior to visit.    Allergies  Allergen Reactions   Tanzeum [Albiglutide] Other (See Comments)    constipation    ROS    See HPI Objective:    Physical Exam Constitutional:      General: He is not in acute distress.    Appearance: He is well-developed.  HENT:     Head: Normocephalic and atraumatic.  Cardiovascular:     Rate and Rhythm: Normal rate and regular rhythm.     Heart sounds: No murmur heard. Pulmonary:     Effort: Pulmonary effort is normal. No respiratory distress.     Breath sounds: Normal breath sounds. No wheezing or rales.  Skin:    General: Skin is warm and dry.  Neurological:     Mental Status: He is alert and oriented to person, place, and time.  Psychiatric:        Behavior: Behavior normal.        Thought Content: Thought content normal.     BP (!) 142/80   Pulse 65   Temp 98 F (36.7 C)   Resp 18   Ht '5\' 6"'$  (1.676 m)   Wt 251 lb 6.4 oz (114 kg)   SpO2 98%   BMI 40.58 kg/m  Wt Readings from Last 3 Encounters:  08/29/22 251 lb 6.4 oz (114 kg)  07/27/22 255 lb (115.7 kg)  04/27/22 252 lb (114.3 kg)       Assessment & Plan:   Problem List Items Addressed This Visit       Unprioritized   Hyperlipidemia    Lab Results  Component Value Date   CHOL 158 03/30/2022   HDL 44 03/30/2022   LDLCALC 96 03/30/2022   TRIG 97 03/30/2022   CHOLHDL  3.6 03/30/2022  Continues lipitor.       HTN (hypertension)    Maintained on entresto.        Relevant Orders   Basic metabolic panel   Diabetes type 2, controlled ([place])    Lab Results  Component Value Date   HGBA1C 6.1 04/27/2022   HGBA1C 6.1 01/01/2022   HGBA1C 6.2 09/29/2021   Lab Results  Component Value Date   MICROALBUR 1.1 03/11/2017   [place] 96 03/30/2022   CREATININE 0.76 03/30/2022        Relevant Orders   Hemoglobin A1c   Other Visit Diagnoses     Poor balance    -  Primary   Relevant Orders   Ambulatory referral to Physical Therapy   Need for influenza vaccination       Relevant Orders   Flu Vaccine QUAD High Dose(Fluad) (Completed)       I have changed Mitzi Hansen B. Palardy "Andy"'s clopidogrel. I am also having him maintain his Testosterone, FREESTYLE LITE, freestyle, colchicine, clobetasol cream, Restasis, ezetimibe, famotidine, metFORMIN, atorvastatin, Entresto, and diltiazem.  Meds ordered this encounter  Medications   clopidogrel (PLAVIX) 75 MG tablet    Sig: Take 1 tablet (75 mg total) by mouth daily.    Dispense:  90 tablet    Refill:  1    Order Specific Question:   Supervising Provider    Answer:   Penni Homans A [[id number]]

## 2022-08-29 NOTE — Assessment & Plan Note (Addendum)
Maintained on entresto.   BP Readings from Last 3 Encounters:  08/29/22 (!) 142/80  07/27/22 (!) 140/92  04/27/22 (!) 143/70   BP mildly elevated. He will recheck some readings at home and send me his readings via mychart.

## 2022-08-29 NOTE — Assessment & Plan Note (Signed)
Lab Results  Component Value Date   HGBA1C 6.1 04/27/2022   HGBA1C 6.1 01/01/2022   HGBA1C 6.2 09/29/2021   Lab Results  Component Value Date   MICROALBUR 1.1 03/11/2017   LDLCALC 96 03/30/2022   CREATININE 0.76 03/30/2022   Stable on metformin. Continue same.

## 2022-08-29 NOTE — Assessment & Plan Note (Signed)
Due for annual follow up imaging.

## 2022-08-30 ENCOUNTER — Ambulatory Visit: Payer: Medicare Other | Attending: Family | Admitting: Physical Therapy

## 2022-08-30 DIAGNOSIS — R2689 Other abnormalities of gait and mobility: Secondary | ICD-10-CM | POA: Insufficient documentation

## 2022-08-30 DIAGNOSIS — R2681 Unsteadiness on feet: Secondary | ICD-10-CM | POA: Diagnosis not present

## 2022-08-30 DIAGNOSIS — M6281 Muscle weakness (generalized): Secondary | ICD-10-CM | POA: Diagnosis not present

## 2022-08-30 DIAGNOSIS — R296 Repeated falls: Secondary | ICD-10-CM | POA: Diagnosis not present

## 2022-08-30 NOTE — Therapy (Addendum)
OUTPATIENT PHYSICAL THERAPY LOWER EXTREMITY EVALUATION   Patient Name: Steven Payne MRN: [id number] DOB:07/18/1946, 76 y.o., male Today's Date: 08/30/2022   PT End of Session - 08/30/22 0953     Visit Number 1    Number of Visits 12    Date for PT Re-Evaluation 10/11/22    Authorization Type Medicare + Tricare    Progress Note Due on Visit 10    PT Start Time [phone]    PT Stop Time 0935    PT Time Calculation (min) 49 min    Activity Tolerance Patient tolerated treatment well    Behavior During Therapy [facility] for tasks assessed/performed             Past Medical History:  Diagnosis Date   Arthritis    Ascending aorta dilatation ([place]) 07/19/2021   Atherosclerosis of native coronary artery of native heart with angina pectoris ([place]) 02/23/2016   Coronary artery disease cardiologist-  dr Irish Lack   per cardiac cath 06/ 2013--  no sig. 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   Diabetes mellitus due to underlying condition with unspecified complications ([place]) 93/71/6967   Diabetes type 2, controlled ([place]) 02/23/2016   ED (erectile dysfunction)    Flatulence 09/29/2021   GERD (gastroesophageal reflux disease)    Gout 02/23/2016   History of COVID-19 05/24/2021   History of gout    approx 2002   HTN (hypertension) 02/23/2016   Hyperlipidemia 02/23/2016   Hypogonadism male    Left hydrocele    Morbid obesity ([place]) 07/19/2021   OSA (obstructive sleep apnea) 05/24/2021   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   ??   HYDROCELE EXCISION Left 09/10/2016   Procedure: HYDROCELECTOMY ADULT;  Surgeon: Kathie Rhodes, MD;  [facility];  Service: Urology;  Laterality: Left;   UMBILICAL HERNIA REPAIR  1980's   Patient Active Problem List   Diagnosis Date Noted   History of gout 01/16/2022   Coronary artery disease 08/15/2021   Ascending aorta dilatation ([place]) 07/19/2021   Morbid obesity ([place]) 07/19/2021   Arthritis 07/17/2021   Hypogonadism male 07/17/2021   Left hydrocele 07/17/2021   OSA (obstructive sleep apnea) 05/24/2021   History of COVID-19 05/24/2021   Sweat gland tumor 2021   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   Diabetes mellitus due to underlying condition with unspecified complications ([place]) 89/38/1017   ED (erectile dysfunction) 03/11/2017   Diabetes type 2, controlled ([place]) 02/23/2016   HTN (hypertension) 02/23/2016   Hyperlipidemia 02/23/2016   GERD (gastroesophageal reflux disease) 02/23/2016    PCP: [name], NP  REFERRING PROVIDER: [name], NP  REFERRING DIAG: R26.89 (ICD-10-CM) - Poor balance  THERAPY DIAG:  Repeated falls  Unsteadiness on feet  Muscle weakness (generalized)  Rationale for Evaluation and Treatment Rehabilitation  ONSET DATE: couple of years  SUBJECTIVE:   SUBJECTIVE STATEMENT: Patient reports that he has started falling over last few years, even falling and breaking leg.  In  addition he reports he started having ringing in his ears and headaches last couple of months that are making his balance worse.   PERTINENT HISTORY: HTN, CAD, obesity, T2DM, gout  PAIN:  Are you having pain? No  PRECAUTIONS: Fall  WEIGHT BEARING RESTRICTIONS No  FALLS:  Has patient fallen in last 6 months? Yes. Number of falls at least 10  LIVING ENVIRONMENT: Lives with: lives with their spouse Lives in: House/apartment Stairs: Yes: External: 2 steps; none Has following  equipment at home: Single point cane and None  OCCUPATION: retired, former Psychologist, clinical)   PLOF: Independent, goes to gym 4x/week (treadmill, light lifting), enjoys travel  [place] not fall   OBJECTIVE:   DIAGNOSTIC FINDINGS: no recent imaging  PATIENT SURVEYS:  ABC scale 92.5%  COGNITION:  Overall cognitive status: Within functional limits for tasks assessed     SENSATION: WFL  EDEMA:  NA  MUSCLE LENGTH: NT  POSTURE: rounded shoulders, forward head, and increased thoracic kyphosis  PALPATION: NA  LOWER EXTREMITY MMT:  MMT Right eval Left eval  Hip flexion 5 4+  Hip extension 5 4+  Hip abduction 5 5  Hip adduction 5 5  Knee flexion 5 4+  Knee extension 5 5  Ankle dorsiflexion 5 5  Ankle plantarflexion 5 5  Ankle inversion    Ankle eversion     (Blank rows = not tested)  LOWER EXTREMITY ROM:  NT  LOWER EXTREMITY SPECIAL TESTS:  NT  FUNCTIONAL TESTS:  5 times sit to stand: 6 seconds Timed up and go (TUG): 7 seconds Berg Balance Scale: 54/56 Dynamic Gait Index: 22/24 Functional gait assessment: 21/30 MCTSIB: Condition 1: Avg of 3 trials: 30 sec, Condition 2: Avg of 3 trials: 8 sec, Condition 3: Avg of 3 trials: 30 sec, Condition 4: Avg of 3 trials: 21 sec, and Total Score: 89/120   GAIT: Distance walked: 300' Assistive device utilized: None Level of assistance: Complete Independence Comments: wide BOS, downward gaze   Gait speed 1.04 m/s typical, 1.21 m/s fast speed   TODAY'S TREATMENT: 08/30/2022 Self Care: education on fall risks and prevention, initial HEP for balance.     PATIENT EDUCATION:  Education details: see self care Person educated: Patient Education method: Explanation, Demonstration, and Handouts Education comprehension: verbalized understanding   HOME EXERCISE PROGRAM: Access Code: [email or web address] URL: [email or web address] Date: 08/30/2022 Prepared by: Glenetta Hew  Exercises - Standing  Tandem Balance with Counter Support  - 1 x daily - 7 x weekly - 1 sets - 3 reps - 30 sec hold - Walking Tandem Stance  - 1 x daily - 7 x weekly - 1 sets - 10 reps - Backward Tandem Walking  - 1 x daily - 7 x weekly - 1 sets - 10 reps - Standing Single Leg Stance with Counter Support  - 1 x daily - 7 x weekly - 1 sets - 3 reps - 30 sec  hold  ASSESSMENT:  CLINICAL IMPRESSION: Patient is a 76 y.o. right hand dominant male who was seen today for physical therapy evaluation and treatment for poor balance.  He reports frequents falls, and in 2021 even fell in laundry room (missed step) and broke R fibula.  He is active and goes to gym 4x/week, walking treadmill for 1 hour, and LE strength is good (slightly weaker in LLE compared to RLE).  He does demonstrate strong preference for vision for balance, demonstrating unsteadiness with eyes closed and on unstable surface.  He reports recent worsening  of tinnitus, so he would benefit from vestibular assessment as well.  He demonstrates increased risk of falls as evidenced by inability to maintain SLS, an unable to maintain 30 sec on all for conditions of mCTSIB.  He also had immediate posterior LOB when asked to stand on heels. [name]  will benefit from skilled physical therapy services to help reach the maximal level of functional independence and mobility and decrease risk of falls. Pt demonstrates understanding of this plan of care and is in agreement with this plan.   OBJECTIVE IMPAIRMENTS decreased balance, decreased strength, impaired perceived functional ability, and postural dysfunction.   ACTIVITY LIMITATIONS bending and locomotion level  PARTICIPATION LIMITATIONS: community activity and yard work  PERSONAL FACTORS 3+ comorbidities: obesity, T2DM, gout, CAD, HTN  are also affecting patient's functional outcome.   REHAB POTENTIAL: Excellent  CLINICAL DECISION MAKING: Evolving/moderate complexity  EVALUATION COMPLEXITY:  Moderate   GOALS: Goals reviewed with patient? Yes  SHORT TERM GOALS: Target date: 09/13/2022   Patient will be independent with initial HEP. Baseline: given Goal status: INITIAL  2.  Patient will be educated on strategies to decrease risk of falls.  Baseline: educated today on importance of night lights, tacking down throw rugs, sitting/standing up slowly after supine.  Goal status: MET   LONG TERM GOALS: Target date: 10/11/2022   Patient will be independent with advanced/ongoing HEP to improve outcomes and carryover.  Baseline: needs progression Goal status: INITIAL  3.  Patient will demonstrate improved functional LE strength as demonstrated by 5/5 LLE strength. Baseline: see objective Goal status: INITIAL  4.  Patient will demonstrate 25/30 on FGA to decrease risk of falls.  Baseline: 21/30 Goal status: INITIAL  5.  Patient will score 120/120 on mCTSIB to demonstrate equal weighting of all balance systems.   Baseline: 89/120 Goal status: INITIAL  6.  Patient will report 100% confidence on ABC scale to demonstrate improved functional ability. Baseline: 92.5% Goal status: INITIAL  7.  Patient will be able to maintain SLS x 15 sec bil to lower risk of falls.   Baseline: <5 sec Goal status: INITIAL    PLAN: PT FREQUENCY: 1-2x/week  PT DURATION: 6 weeks  PLANNED INTERVENTIONS: Therapeutic exercises, Therapeutic activity, Neuromuscular re-education, Balance training, Gait training, Patient/Family education, Self Care, Joint mobilization, Vestibular training, Cryotherapy, Moist heat, Manual therapy, and Re-evaluation  PLAN FOR NEXT SESSION: assess vestibular system, higher level balance activities and reactive balance control.    Rennie Natter, PT, DPT  08/30/2022, 10:07 AM

## 2022-09-04 ENCOUNTER — Encounter: Payer: Self-pay | Admitting: Physical Therapy

## 2022-09-04 ENCOUNTER — Ambulatory Visit: Payer: Medicare Other | Attending: Family | Admitting: Physical Therapy

## 2022-09-04 DIAGNOSIS — R296 Repeated falls: Secondary | ICD-10-CM | POA: Insufficient documentation

## 2022-09-04 DIAGNOSIS — R2681 Unsteadiness on feet: Secondary | ICD-10-CM

## 2022-09-04 DIAGNOSIS — M6281 Muscle weakness (generalized): Secondary | ICD-10-CM | POA: Diagnosis not present

## 2022-09-04 NOTE — Therapy (Signed)
OUTPATIENT PHYSICAL THERAPY TREATMENT  Patient Name: Steven Payne MRN: [id number] DOB:Sep 23, 1946, 76 y.o., male Today's Date: 09/04/2022   PT End of Session - 09/04/22 0849     Visit Number 2    Number of Visits 12    Date for PT Re-Evaluation 10/11/22    Authorization Type Medicare + Tricare    Progress Note Due on Visit 10    PT Start Time [phone]    PT Stop Time 0930    PT Time Calculation (min) 41 min    Activity Tolerance Patient tolerated treatment well    Behavior During Therapy [facility] for tasks assessed/performed             Past Medical History:  Diagnosis Date   Arthritis    Ascending aorta dilatation ([place]) 07/19/2021   Atherosclerosis of native coronary artery of native heart with angina pectoris ([place]) 02/23/2016   Coronary artery disease cardiologist-  dr Irish Lack   per cardiac cath 06/ 2013--  no sig. 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   Diabetes mellitus due to underlying condition with unspecified complications ([place]) 49/70/2637   Diabetes type 2, controlled ([place]) 02/23/2016   ED (erectile dysfunction)    Flatulence 09/29/2021   GERD (gastroesophageal reflux disease)    Gout 02/23/2016   History of COVID-19 05/24/2021   History of gout    approx 2002   HTN (hypertension) 02/23/2016   Hyperlipidemia 02/23/2016   Hypogonadism male    Left hydrocele    Morbid obesity ([place]) 07/19/2021   OSA (obstructive sleep apnea) 05/24/2021   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   ??   HYDROCELE EXCISION Left 09/10/2016   Procedure: HYDROCELECTOMY ADULT;  Surgeon: Kathie Rhodes, MD;  Location: [facility];  Service: Urology;  Laterality: Left;   UMBILICAL HERNIA REPAIR  1980's   Patient Active Problem List   Diagnosis Date Noted   History of gout 01/16/2022   Coronary artery disease 08/15/2021   Ascending aorta dilatation ([place]) 07/19/2021   Morbid obesity ([place]) 07/19/2021   Arthritis 07/17/2021   Hypogonadism male 07/17/2021   Left hydrocele 07/17/2021   OSA (obstructive sleep apnea) 05/24/2021   History of COVID-19 05/24/2021   Sweat gland tumor 2021   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   Diabetes mellitus due to underlying condition with unspecified complications ([place]) 85/88/5027   ED (erectile dysfunction) 03/11/2017   Diabetes type 2, controlled ([place]) 02/23/2016   HTN (hypertension) 02/23/2016   Hyperlipidemia 02/23/2016   GERD (gastroesophageal reflux disease) 02/23/2016    PCP: [name], NP  REFERRING PROVIDER: [name], NP  REFERRING DIAG: R26.89 (ICD-10-CM) - Poor balance  THERAPY DIAG:  Repeated falls  Unsteadiness on feet  Muscle weakness (generalized)  Rationale for Evaluation and Treatment Rehabilitation  ONSET DATE: couple of years  SUBJECTIVE:   SUBJECTIVE STATEMENT: Patient reports no new falls.  Legs are a little sore today.  He had an " ear ringing headache" yesterday.  "  [facility]."   PERTINENT HISTORY: HTN, CAD, obesity, T2DM, gout  PAIN:  Are you having pain? No  PRECAUTIONS: Fall  WEIGHT BEARING RESTRICTIONS No  FALLS:  Has patient fallen in last 6 months? Yes. Number of falls at least 10  LIVING ENVIRONMENT: Lives with: lives with their spouse Lives in: House/apartment Stairs: Yes: External: 2 steps; none Has following equipment at home: Single point cane and None  OCCUPATION: retired, former  Psychologist, clinical)   PLOF: Independent, goes to gym 4x/week (treadmill, light lifting), enjoys travel  [place] not fall   OBJECTIVE:   DIAGNOSTIC FINDINGS: no recent imaging  PATIENT SURVEYS:  ABC scale 92.5%  COGNITION:  Overall cognitive status: Within functional limits for tasks assessed     SENSATION: WFL  EDEMA:  NA  MUSCLE LENGTH: NT  POSTURE: rounded shoulders, forward head, and increased thoracic kyphosis  PALPATION: NA  LOWER EXTREMITY MMT:  MMT Right eval Left eval  Hip flexion 5 4+  Hip extension 5 4+  Hip abduction 5 5  Hip adduction 5 5  Knee flexion 5 4+  Knee extension 5 5  Ankle dorsiflexion 5 5  Ankle plantarflexion 5 5  Ankle inversion    Ankle eversion     (Blank rows = not tested)  LOWER EXTREMITY ROM:  NT  CERVICAL ROM:  AROM eval  Cervical Flexion 50  Cervical Extension 60  Cervical Rotation to Left 75  Cervical Rotation to Right 75  Left Sidebend 30  Right Sidebend 40    LOWER EXTREMITY SPECIAL TESTS:  NT   VESTIBULAR ASSESSMENT    SYMPTOM BEHAVIOR:   Subjective history: patient was in artillery, 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  Feels that it has affected his balance.  Now he is getting headaches as well, and then he starts feeling dizzy   Non-Vestibular symptoms: headaches and tinnitus   Type of dizziness: Imbalance (Disequilibrium) and "Swimmyheaded" - feels like waves   Frequency: 1-2x/week   Duration: 30 min to hour   Aggravating factors:  loud noise   Relieving factors: lying supine and advil for headache   Progression of symptoms: worse   OCULOMOTOR EXAM:   Ocular Alignment: normal   Ocular ROM: No Limitations   Spontaneous Nystagmus: absent   Gaze-Induced Nystagmus: age appropriate nystagmus at end range   Smooth Pursuits: saccades   Saccades: hypometric/undershoots with horizontal head movements.      VESTIBULAR - OCULAR REFLEX:    Slow VOR: Normal   VOR Cancellation:  Normal   Head-Impulse Test: HIT Right: negative HIT Left: negative   Dynamic Visual Acuity:  NT    POSITIONAL TESTING: Right Dix-Hallpike: no nystagmus Left Dix-Hallpike: no nystagmus Right Roll Test: geotropic nystagmus Left Roll Test: no nystagmus    MOTION SENSITIVITY:    Motion Sensitivity Quotient  Intensity: 0 = none, 1 = Lightheaded, 2 = Mild, 3 = Moderate, 4 = Severe, 5 = Vomiting  Intensity  1. Sitting to supine   2. Supine to L side   3. Supine to R side   4. Supine to sitting   5. L Hallpike-Dix 0  6. Up from L  0  7. R Hallpike-Dix 0  8. Up from R  0  9. Sitting, head  tipped to L knee 0  10. Head up from L  knee 0  11. Sitting, head  tipped to R knee 0  12. Head up from R  knee 0  13. Sitting head turns x5 0  14.Sitting head nods x5 0  15. In stance, 180  turn to L    16. In stance, 180  turn to R      FUNCTIONAL TESTS:  5 times sit to stand: 6 seconds Timed up and go (TUG): 7 seconds Berg Balance Scale: 54/56 Dynamic Gait Index: 22/24 Functional gait assessment: 21/30 MCTSIB: Condition 1: Avg of 3 trials: 30 sec, Condition 2: Avg of 3 trials: 8 sec, Condition 3: Avg of 3 trials: 30 sec, Condition 4: Avg of 3 trials: 21 sec, and Total Score: 89/120   GAIT: Distance walked: 300' Assistive device utilized: None Level of assistance: Complete Independence Comments: wide BOS, downward gaze   Gait speed 1.04 m/s typical, 1.21 m/s fast speed   TODAY'S TREATMENT: 09/04/2022 Therapeutic Activity:  see vestibular assessment.   Canalith Repositioning:   Horizontal Cupulolith Repositioning Maneuver: Number of Reps: 1    08/30/2022 Self Care: education on fall risks and prevention, initial HEP for balance.     PATIENT EDUCATION:  Education details: see self care Person educated: Patient Education method: Explanation, Demonstration, and Handouts Education comprehension: verbalized understanding   HOME EXERCISE PROGRAM: Access Code:  [email or web address] URL: [email or web address] Date: 08/30/2022 Prepared by: Glenetta Hew  Exercises - Standing Tandem Balance with Counter Support  - 1 x daily - 7 x weekly - 1 sets - 3 reps - 30 sec hold - Walking Tandem Stance  - 1 x daily - 7 x weekly - 1 sets - 10 reps - Backward Tandem Walking  - 1 x daily - 7 x weekly - 1 sets - 10 reps - Standing Single Leg Stance with Counter Support  - 1 x daily - 7 x weekly - 1 sets - 3 reps - 30 sec  hold  ASSESSMENT:  CLINICAL IMPRESSION: Today assessed vestibular system.  Steven Payne demonstrated increased dizziness with forward head bends, and with positional testing reported dizziness with R side roll lasting > 1 min, indicating R horizontal canal cupulothiasis.   Performed modified semont repositioning maneuver, tolerated well, brief dizziness with returning to sit.  Cautioned to not bend over today.  Steven Payne continues to demonstrate potential for improvement and would benefit from continued skilled therapy to address impairments.     OBJECTIVE IMPAIRMENTS decreased balance, decreased strength, impaired perceived functional ability, and postural dysfunction.   ACTIVITY LIMITATIONS bending and locomotion level  PARTICIPATION LIMITATIONS: community activity and yard work  PERSONAL FACTORS 3+ comorbidities: obesity, T2DM, gout, CAD, HTN  are also affecting patient's functional outcome.   REHAB POTENTIAL: Excellent  CLINICAL DECISION MAKING: Evolving/moderate complexity  EVALUATION COMPLEXITY: Moderate   GOALS: Goals reviewed with patient? Yes  SHORT TERM GOALS: Target date: 09/13/2022   Patient will be independent with initial HEP. Baseline: given Goal status: IN PROGRESS  2.  Patient will be educated on strategies to decrease risk of falls.  Baseline: educated today on importance of night lights, tacking down throw rugs, sitting/standing up slowly after supine.  Goal status: MET   LONG TERM GOALS: Target  date: 10/11/2022   Patient will be independent with advanced/ongoing HEP to improve outcomes and carryover.  Baseline: needs progression Goal status: IN PROGRESS  3.  Patient will demonstrate improved functional LE strength as demonstrated by 5/5 LLE strength. Baseline: see objective Goal status: IN PROGRESS  4.  Patient will demonstrate 25/30 on FGA to decrease risk of falls.  Baseline: 21/30 Goal status: IN  PROGRESS  5.  Patient will score 120/120 on mCTSIB to demonstrate equal weighting of all balance systems.   Baseline: 89/120 Goal status: IN PROGRESS  6.  Patient will report 100% confidence on ABC scale to demonstrate improved functional ability. Baseline: 92.5% Goal status: IN PROGRESS  7.  Patient will be able to maintain SLS x 15 sec bil to lower risk of falls.   Baseline: <5 sec Goal status: IN PROGRESS    PLAN: PT FREQUENCY: 1-2x/week  PT DURATION: 6 weeks  PLANNED INTERVENTIONS: Therapeutic exercises, Therapeutic activity, Neuromuscular re-education, Balance training, Gait training, Patient/Family education, Self Care, Joint mobilization, Vestibular training, Cryotherapy, Moist heat, Manual therapy, and Re-evaluation  PLAN FOR NEXT SESSION: recheck horizontal canals, progress balance training.     Rennie Natter, PT, DPT  09/04/2022, 12:06 PM

## 2022-09-06 ENCOUNTER — Encounter: Payer: Medicare Other | Admitting: Physical Therapy

## 2022-09-07 ENCOUNTER — Ambulatory Visit (HOSPITAL_BASED_OUTPATIENT_CLINIC_OR_DEPARTMENT_OTHER)
Admission: RE | Admit: 2022-09-07 | Discharge: 2022-09-07 | Disposition: A | Discharge status: Home / Self Care | Payer: Medicare Other | Source: Ambulatory Visit | Attending: Family | Admitting: Family

## 2022-09-07 ENCOUNTER — Telehealth: Payer: Self-pay | Admitting: Family

## 2022-09-07 ENCOUNTER — Encounter (HOSPITAL_BASED_OUTPATIENT_CLINIC_OR_DEPARTMENT_OTHER): Payer: Self-pay

## 2022-09-07 DIAGNOSIS — I7121 Aneurysm of the ascending aorta, without rupture: Secondary | ICD-10-CM | POA: Diagnosis not present

## 2022-09-07 DIAGNOSIS — K769 Liver disease, unspecified: Secondary | ICD-10-CM

## 2022-09-07 DIAGNOSIS — I77810 Thoracic aortic ectasia: Secondary | ICD-10-CM | POA: Insufficient documentation

## 2022-09-07 MED ORDER — IOHEXOL 350 MG/ML SOLN
100.0000 mL | Freq: Once | INTRAVENOUS | Status: AC | PRN
  Administered 2022-09-07: 125 mL via INTRAVENOUS

## 2022-09-07 NOTE — Telephone Encounter (Signed)
Patient notified of results.

## 2022-09-07 NOTE — Telephone Encounter (Signed)
Aorta looks normal on CT.  CT shows fatty liver- diet/exercise and weight loss is the best treatment for fatty liver.   There is a spot I the liver that may be a collection of blood vessels, but radiologist recommends that we do an MRI to double check. Order placed.

## 2022-09-11 ENCOUNTER — Ambulatory Visit: Payer: Medicare Other | Admitting: Physical Therapy

## 2022-09-13 ENCOUNTER — Ambulatory Visit: Payer: Medicare Other | Admitting: Physical Therapy

## 2022-09-13 ENCOUNTER — Other Ambulatory Visit (HOSPITAL_BASED_OUTPATIENT_CLINIC_OR_DEPARTMENT_OTHER): Payer: Self-pay

## 2022-09-13 DIAGNOSIS — R296 Repeated falls: Secondary | ICD-10-CM

## 2022-09-13 DIAGNOSIS — Z23 Encounter for immunization: Secondary | ICD-10-CM | POA: Diagnosis not present

## 2022-09-13 DIAGNOSIS — R2681 Unsteadiness on feet: Secondary | ICD-10-CM

## 2022-09-13 DIAGNOSIS — M6281 Muscle weakness (generalized): Secondary | ICD-10-CM

## 2022-09-13 MED ORDER — COVID-19 MRNA 2023-2024 VACCINE (COMIRNATY) 0.3 ML INJECTION
INTRAMUSCULAR | 0 refills | Status: DC
  Filled 2022-09-13: qty 0.3, 1d supply, fill #0

## 2022-09-13 NOTE — Therapy (Signed)
OUTPATIENT PHYSICAL THERAPY TREATMENT  Patient Name: Steven Payne MRN: [id number] DOB:12/18/1945, 76 y.o., male Today's Date: 09/13/2022   PT End of Session - 09/13/22 0849     Visit Number 3    Number of Visits 12    Date for PT Re-Evaluation 10/11/22    Authorization Type Medicare + Tricare    Progress Note Due on Visit 10    PT Start Time 0848    PT Stop Time 0932    PT Time Calculation (min) 44 min    Activity Tolerance Patient tolerated treatment well    Behavior During Therapy [facility] for tasks assessed/performed             Past Medical History:  Diagnosis Date   Arthritis    Ascending aorta dilatation ([place]) 07/19/2021   Atherosclerosis of native coronary artery of native heart with angina pectoris ([place]) 02/23/2016   Coronary artery disease cardiologist-  dr Irish Lack   per cardiac cath 06/ 2013--  no sig. 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   ??   HYDROCELE EXCISION Left 09/10/2016   Procedure: HYDROCELECTOMY ADULT;  Surgeon: Kathie Rhodes, MD;  [facility];  Service: Urology;  Laterality: Left;   UMBILICAL HERNIA REPAIR  1980's   Patient Active Problem List   Diagnosis Date Noted   History of gout 01/16/2022   Coronary artery disease 08/15/2021   Ascending aorta dilatation ([place]) 07/19/2021   Morbid obesity ([place]) 07/19/2021   Arthritis 07/17/2021   Hypogonadism male 07/17/2021   Left hydrocele 07/17/2021   OSA (obstructive sleep apnea) 05/24/2021   History of COVID-19 05/24/2021   Sweat gland tumor 2021   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   Diabetes mellitus due to underlying condition with unspecified complications ([place]) 65/78/4696   ED (erectile dysfunction) 03/11/2017   Diabetes type 2, controlled ([place]) 02/23/2016   HTN (hypertension) 02/23/2016   Hyperlipidemia 02/23/2016   GERD (gastroesophageal reflux disease) 02/23/2016    PCP: [name], NP  REFERRING PROVIDER: [name], NP  REFERRING DIAG: R26.89 (ICD-10-CM) - Poor balance  THERAPY DIAG:  Repeated falls  Unsteadiness on feet  Muscle weakness (generalized)  Rationale for Evaluation and Treatment Rehabilitation  ONSET DATE: couple of years  SUBJECTIVE:   SUBJECTIVE STATEMENT: Steven Payne reports that he looked up the canalith repositioning maneuvers and wife helped and he hasn't had any dizziness  since Sunday.  He also hasn't felt like he was going to fall.   Ringing in the ears hasn't improved, a little worse, but loud noises like clapping make it worse.    PERTINENT HISTORY: HTN, CAD, obesity, T2DM, gout  PAIN:  Are you having pain? No  PRECAUTIONS: Fall  WEIGHT BEARING RESTRICTIONS No  FALLS:  Has patient fallen in last 6 months? Yes. Number of falls at least 10  LIVING ENVIRONMENT: Lives with: lives with their spouse Lives in: House/apartment Stairs:  Yes: External: 2 steps; none Has following equipment at home: Single point cane and None  OCCUPATION: retired, former Psychologist, clinical)   PLOF: Independent, goes to gym 4x/week (treadmill, light lifting), enjoys travel  [place] not fall   OBJECTIVE:   DIAGNOSTIC FINDINGS: no recent imaging  PATIENT SURVEYS:  ABC scale 92.5%  COGNITION:  Overall cognitive status: Within functional limits for tasks assessed     SENSATION: WFL  EDEMA:  NA  MUSCLE LENGTH: NT  POSTURE: rounded shoulders, forward head, and increased thoracic kyphosis  PALPATION: NA  LOWER EXTREMITY MMT:  MMT Right eval Left eval  Hip flexion 5 4+  Hip extension 5 4+  Hip abduction 5 5  Hip adduction 5 5  Knee flexion 5 4+  Knee extension 5 5  Ankle dorsiflexion 5 5  Ankle plantarflexion 5 5  Ankle inversion    Ankle eversion     (Blank rows = not tested)  LOWER EXTREMITY ROM:  NT  CERVICAL ROM:  AROM eval  Cervical Flexion 50  Cervical Extension 60  Cervical Rotation to Left 75  Cervical Rotation to Right 75  Left Sidebend 30  Right Sidebend 40    LOWER EXTREMITY SPECIAL TESTS:  NT   VESTIBULAR ASSESSMENT    SYMPTOM BEHAVIOR:   Subjective history: patient was in artillery, 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  Feels that it has affected his balance.  Now he is getting headaches as well, and then he starts feeling dizzy   Non-Vestibular symptoms: headaches and tinnitus   Type of dizziness: Imbalance (Disequilibrium) and "Swimmyheaded" - feels like waves   Frequency: 1-2x/week   Duration: 30 min to hour   Aggravating factors:  loud noise   Relieving factors: lying supine and advil for headache   Progression of symptoms: worse   OCULOMOTOR EXAM:   Ocular Alignment: normal   Ocular ROM: No Limitations   Spontaneous Nystagmus: absent   Gaze-Induced Nystagmus: age appropriate nystagmus at end range   Smooth Pursuits: saccades   Saccades:  hypometric/undershoots with horizontal head movements.      VESTIBULAR - OCULAR REFLEX:    Slow VOR: Normal   VOR Cancellation: Normal   Head-Impulse Test: HIT Right: negative HIT Left: negative   Dynamic Visual Acuity:  NT    POSITIONAL TESTING: Right Dix-Hallpike: no nystagmus Left Dix-Hallpike: no nystagmus Right Roll Test: geotropic nystagmus Left Roll Test: no nystagmus    MOTION SENSITIVITY:    Motion Sensitivity Quotient  Intensity: 0 = none, 1 = Lightheaded, 2 = Mild, 3 = Moderate, 4 = Severe, 5 = Vomiting  Intensity  1. Sitting to supine   2. Supine to L side   3. Supine to R side   4. Supine to sitting   5. L Hallpike-Dix 0  6. Up from L  0  7. R Hallpike-Dix 0  8. Up from R  0  9. Sitting, head  tipped to L knee 0  10. Head  up from L  knee 0  11. Sitting, head  tipped to R knee 0  12. Head up from R  knee 0  13. Sitting head turns x5 0  14.Sitting head nods x5 0  15. In stance, 180  turn to L    16. In stance, 180  turn to R      FUNCTIONAL TESTS:  5 times sit to stand: 6 seconds Timed up and go (TUG): 7 seconds Berg Balance Scale: 54/56 Dynamic Gait Index: 22/24 Functional gait assessment: 21/30 MCTSIB: Condition 1: Avg of 3 trials: 30 sec, Condition 2: Avg of 3 trials: 8 sec, Condition 3: Avg of 3 trials: 30 sec, Condition 4: Avg of 3 trials: 21 sec, and Total Score: 89/120   GAIT: Distance walked: 300' Assistive device utilized: None Level of assistance: Complete Independence Comments: wide BOS, downward gaze   Gait speed 1.04 m/s typical, 1.21 m/s fast speed   TODAY'S TREATMENT: 09/06/2022  Therapeutic Exercise: to improve strength and mobility.  Demo, verbal and tactile cues throughout for technique.  Nustep L6 x 6 min   Church pews x 10  Review of HEP Neuromuscular Reeducation: to improve balance and stability. SBA for safety throughout.  In corner on airex: Eyes open feet apart x 30 sec  Eyes open feet apart with head nods x  10 Eyes open feet apart with head turns x 10 Eyes closed feet apart x 30 sec In corner on level surface: Tandem stance x 30 sec bil Eyes closed feet together x 30 sec Eyes closed feet together with head nods x 10 Eyes closed feet together with head turns x 10  At counter:  -intermittent UE support needed for balance.  Forward step and reach x 10 bil  Side step and turn x 10 bil  Manual Therapy: to decrease muscle spasm and improve mobility.  STM/TPR to cervical paraspinals, PA mobs and NAGs into rotation.    09/04/2022 Therapeutic Activity:  see vestibular assessment.   Canalith Repositioning:   Horizontal Cupulolith Repositioning Maneuver: Number of Reps: 1  08/30/2022 Self Care: education on fall risks and prevention, initial HEP for balance.     PATIENT EDUCATION:  Education details: HEP update 10/12 Person educated: Patient Education method: Explanation, Demonstration, and Handouts Education comprehension: verbalized understanding   HOME EXERCISE PROGRAM: Access Code: [email or web address]  ASSESSMENT:  CLINICAL IMPRESSION: Steven Payne reports improvement in dizziness symptoms and today was negative for horizontal canalithiasis, so focus of session was on balance and progression of HEP.  He is compliance with initial HEP meeting STG #2.  He had significant difficulty with all eyes closed activities, educated on safety with low light conditions and to use night lights at home.  He reported some increased ringing in ears towards end of session, but after laying down and manual therapy it diminished significantly.   Steven Payne continues to demonstrate potential for improvement and would benefit from continued skilled therapy to address impairments.     OBJECTIVE IMPAIRMENTS decreased balance, decreased strength, impaired perceived functional ability, and postural dysfunction.   ACTIVITY LIMITATIONS bending and locomotion level  PARTICIPATION LIMITATIONS: community activity and  yard work  PERSONAL FACTORS 3+ comorbidities: obesity, T2DM, gout, CAD, HTN  are also affecting patient's functional outcome.   REHAB POTENTIAL: Excellent  CLINICAL DECISION MAKING: Evolving/moderate complexity  EVALUATION COMPLEXITY: Moderate   GOALS: Goals reviewed with patient? Yes  SHORT TERM GOALS: Target date: 09/13/2022   Patient will be independent with initial HEP.  Baseline: given Goal status: MET 09/13/22 -good compliance  2.  Patient will be educated on strategies to decrease risk of falls.  Baseline: educated today on importance of night lights, tacking down throw rugs, sitting/standing up slowly after supine.  Goal status: MET   LONG TERM GOALS: Target date: 10/11/2022   Patient will be independent with advanced/ongoing HEP to improve outcomes and carryover.  Baseline: needs progression Goal status: IN PROGRESS  3.  Patient will demonstrate improved functional LE strength as demonstrated by 5/5 LLE strength. Baseline: see objective Goal status: IN PROGRESS  4.  Patient will demonstrate 25/30 on FGA to decrease risk of falls.  Baseline: 21/30 Goal status: IN PROGRESS  5.  Patient will score 120/120 on mCTSIB to demonstrate equal weighting of all balance systems.   Baseline: 89/120 Goal status: IN PROGRESS  6.  Patient will report 100% confidence on ABC scale to demonstrate improved functional ability. Baseline: 92.5% Goal status: IN PROGRESS  7.  Patient will be able to maintain SLS x 15 sec bil to lower risk of falls.   Baseline: <5 sec Goal status: IN PROGRESS    PLAN: PT FREQUENCY: 1-2x/week  PT DURATION: 6 weeks  PLANNED INTERVENTIONS: Therapeutic exercises, Therapeutic activity, Neuromuscular re-education, Balance training, Gait training, Patient/Family education, Self Care, Joint mobilization, Vestibular training, Cryotherapy, Moist heat, Manual therapy, and Re-evaluation  PLAN FOR NEXT SESSION: progress balance training.    Rennie Natter, PT, DPT  09/13/2022, 9:44 AM

## 2022-09-18 ENCOUNTER — Encounter: Payer: Medicare Other | Admitting: Physical Therapy

## 2022-09-20 ENCOUNTER — Ambulatory Visit: Payer: Medicare Other

## 2022-09-20 DIAGNOSIS — R2681 Unsteadiness on feet: Secondary | ICD-10-CM

## 2022-09-20 DIAGNOSIS — M6281 Muscle weakness (generalized): Secondary | ICD-10-CM

## 2022-09-20 DIAGNOSIS — R296 Repeated falls: Secondary | ICD-10-CM

## 2022-09-20 NOTE — Therapy (Signed)
OUTPATIENT PHYSICAL THERAPY TREATMENT  Patient Name: Steven Payne MRN: [id number] DOB:[birth year], 76 y.o., male Today's Date: 09/20/2022   PT End of Session - 09/20/22 0929     Visit Number 4    Number of Visits 12    Date for PT Re-Evaluation 10/11/22    Authorization Type Medicare + T[employer]are    Progress Note Due on Visit 10    PT Start Time 0849    PT Stop Time 0929    PT Time Calculation (min) 40 min    Activity Tolerance Patient tolerated treatment well    Behavior During Therapy [facility] for tasks assessed/performed              Past Medical History:  Diagnosis Date   Arthritis    Ascending aorta dilatation ([place]) 07/19/2021   Atherosclerosis of native coronary artery of native heart with angina pectoris ([place]) 02/23/2016   Coronary artery disease cardiologist-  dr Irish Lack   per cardiac cath 06/ 2013--  no sig. 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   Diabetes mellitus due to underlying condition with unspecified complications ([place]) 41/32/4401   Diabetes type 2, controlled ([place]) 02/23/2016   ED (erectile dysfunction)    Flatulence 09/29/2021   GERD (gastroesophageal reflux disease)    Gout 02/23/2016   History of COVID-19 05/24/2021   History of gout    approx 2002   HTN (hypertension) 02/23/2016   Hyperlipidemia 02/23/2016   Hypogonadism male    Left hydrocele    Morbid obesity ([place]) 07/19/2021   OSA (obstructive sleep apnea) 05/24/2021   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   ??   HYDROCELE EXCISION Left 09/10/2016   Procedure: HYDROCELECTOMY ADULT;  Surgeon: Kathie Rhodes, MD;  Location: [facility];  Service: Urology;  Laterality: Left;   UMBILICAL HERNIA REPAIR  1980's   Patient Active Problem List   Diagnosis Date Noted   History of gout 01/16/2022   Coronary artery disease 08/15/2021   Ascending aorta dilatation ([place]) 07/19/2021   Morbid obesity ([place]) 07/19/2021   Arthritis 07/17/2021   Hypogonadism male 07/17/2021   Left hydrocele 07/17/2021   OSA (obstructive sleep apnea) 05/24/2021   History of COVID-19 05/24/2021   Sweat gland tumor 2021   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   Diabetes mellitus due to underlying condition with unspecified complications ([place]) 02/72/5366   ED (erectile dysfunction) 03/11/2017   Diabetes type 2, controlled ([place]) 02/23/2016   HTN (hypertension) 02/23/2016   Hyperlipidemia 02/23/2016   GERD (gastroesophageal reflux disease) 02/23/2016    PCP: [name], NP  REFERRING PROVIDER: [name], NP  REFERRING DIAG: R26.89 (ICD-10-CM) - Poor balance  THERAPY DIAG:  Repeated falls  Unsteadiness on feet  Muscle weakness (generalized)  Rationale for Evaluation and Treatment Rehabilitation  ONSET DATE: couple of years  SUBJECTIVE:   SUBJECTIVE STATEMENT: Pt reports no pain. Had an episode of dizziness, where someone nearby clapped and worsened his symptoms, today he is  doing fine.   PERTINENT HISTORY: HTN, CAD, obesity, T2DM, gout  PAIN:  Are you having pain? No  PRECAUTIONS: Fall  WEIGHT BEARING RESTRICTIONS No  FALLS:  Has patient fallen in last 6 months? Yes. Number of falls at least 10  LIVING ENVIRONMENT: Lives with: lives with their spouse Lives in: House/apartment Stairs: Yes: External: 2 steps; none Has following equipment at home: Single point cane and None  OCCUPATION: retired, former Psychologist, clinical)   PLOF:  Independent, goes to gym 4x/week (treadmill, light lifting), enjoys travel  [place] not fall   OBJECTIVE:   DIAGNOSTIC FINDINGS: no recent imaging  PATIENT SURVEYS:  ABC scale 92.5%  COGNITION:  Overall cognitive status: Within functional limits for tasks assessed     SENSATION: WFL  EDEMA:  NA  MUSCLE LENGTH: NT  POSTURE: rounded shoulders, forward head, and increased thoracic kyphosis  PALPATION: NA  LOWER EXTREMITY MMT:  MMT Right eval Left eval  Hip flexion 5 4+  Hip extension 5 4+  Hip abduction 5 5  Hip adduction 5 5  Knee flexion 5 4+  Knee extension 5 5  Ankle dorsiflexion 5 5  Ankle plantarflexion 5 5  Ankle inversion    Ankle eversion     (Blank rows = not tested)  LOWER EXTREMITY ROM:  NT  CERVICAL ROM:  AROM eval  Cervical Flexion 50  Cervical Extension 60  Cervical Rotation to Left 75  Cervical Rotation to Right 75  Left Sidebend 30  Right Sidebend 40    LOWER EXTREMITY SPECIAL TESTS:  NT   VESTIBULAR ASSESSMENT    SYMPTOM BEHAVIOR:   Subjective history: patient was in artillery, 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  Feels that it has affected his balance.  Now he is getting headaches as well, and then he starts feeling dizzy   Non-Vestibular symptoms: headaches and tinnitus   Type of dizziness: Imbalance (Disequilibrium) and "Swimmyheaded" - feels like waves   Frequency: 1-2x/week   Duration: 30 min to hour   Aggravating factors:  loud noise   Relieving factors: lying supine and advil for headache   Progression of symptoms: worse   OCULOMOTOR EXAM:   Ocular Alignment: normal   Ocular ROM: No Limitations   Spontaneous Nystagmus: absent   Gaze-Induced Nystagmus: age appropriate nystagmus at end range   Smooth Pursuits: saccades   Saccades: hypometric/undershoots with horizontal head movements.      VESTIBULAR - OCULAR REFLEX:    Slow VOR: Normal   VOR Cancellation: Normal   Head-Impulse Test: HIT Right:  negative HIT Left: negative   Dynamic Visual Acuity:  NT    POSITIONAL TESTING: Right Dix-Hallpike: no nystagmus Left Dix-Hallpike: no nystagmus Right Roll Test: geotropic nystagmus Left Roll Test: no nystagmus    MOTION SENSITIVITY:    Motion Sensitivity Quotient  Intensity: 0 = none, 1 = Lightheaded, 2 = Mild, 3 = Moderate, 4 = Severe, 5 = Vomiting  Intensity  1. Sitting to supine   2. Supine to L side   3. Supine to R side   4. Supine to sitting   5. L Hallpike-Dix 0  6. Up from L  0  7. R Hallpike-Dix 0  8. Up from R  0  9. Sitting, head  tipped to L knee 0  10. Head up from L  knee 0  11. Sitting, head  tipped to R knee 0  12. Head up from R  knee 0  13. Sitting head turns x5  0  14.Sitting head nods x5 0  15. In stance, 180  turn to L    16. In stance, 180  turn to R      FUNCTIONAL TESTS:  5 times sit to stand: 6 seconds Timed up and go (TUG): 7 seconds Berg Balance Scale: 54/56 Dynamic Gait Index: 22/24 Functional gait assessment: 21/30 MCTSIB: Condition 1: Avg of 3 trials: 30 sec, Condition 2: Avg of 3 trials: 8 sec, Condition 3: Avg of 3 trials: 30 sec, Condition 4: Avg of 3 trials: 21 sec, and Total Score: 89/120   GAIT: Distance walked: 300' Assistive device utilized: None Level of assistance: Complete Independence Comments: wide BOS, downward gaze   Gait speed 1.04 m/s typical, 1.21 m/s fast speed   TODAY'S TREATMENT: 09/20/22 Therapeutic Exercise: to improve strength and mobility.  Demo, verbal and tactile cues throughout for technique. Nustep L6x2mn Standing heel raise/toe raise x 20 Church pew x 10 Squats at counter x 10 Standing hip abduction and ext at counter x 10 each  Neuromuscular Reeducation: to improve balance and stability. SBA for safety throughout.  In corner on airex: Eyes open feet apart x 30 sec  EO head turns x 10 bil Tandem stance 2x30 sec Tandem walk 4x 10 ft   09/06/2022  Therapeutic Exercise: to improve  strength and mobility.  Demo, verbal and tactile cues throughout for technique.  Nustep L6 x 6 min   Church pews x 10  Review of HEP Neuromuscular Reeducation: to improve balance and stability. SBA for safety throughout.  In corner on airex: Eyes open feet apart x 30 sec  Eyes open feet apart with head nods x 10 Eyes open feet apart with head turns x 10 Eyes closed feet apart x 30 sec In corner on level surface: Tandem stance x 30 sec bil Eyes closed feet together x 30 sec Eyes closed feet together with head nods x 10 Eyes closed feet together with head turns x 10  At counter:  -intermittent UE support needed for balance.  Forward step and reach x 10 bil  Side step and turn x 10 bil  Manual Therapy: to decrease muscle spasm and improve mobility.  STM/TPR to cervical paraspinals, PA mobs and NAGs into rotation.    09/04/2022 Therapeutic Activity:  see vestibular assessment.   Canalith Repositioning:   Horizontal Cupulolith Repositioning Maneuver: Number of Reps: 1  08/30/2022 Self Care: education on fall risks and prevention, initial HEP for balance.     PATIENT EDUCATION:  Education details: HEP update 10/12 Person educated: Patient Education method: Explanation, Demonstration, and Handouts Education comprehension: verbalized understanding   HOME EXERCISE PROGRAM: Access Code: [email or web address]  ASSESSMENT:  CLINICAL IMPRESSION: Pt responded well to treatment. He did have an episode of dizziness earlier this week, which he reported was bad but today has was doing fine. He was able to continue with progression of balance and proprioception to improve kinesthesia and decrease fall risk. CGA and SBA was given throughout session for safety., Postural cues required with standing counter hip exercises to avoid leaning with trunk.    OBJECTIVE IMPAIRMENTS decreased balance, decreased strength, impaired perceived functional ability, and postural dysfunction.   ACTIVITY LIMITATIONS  bending and locomotion level  PARTICIPATION LIMITATIONS: community activity and yard work  PERSONAL FACTORS 3+ comorbidities: obesity, T2DM, gout, CAD, HTN  are also affecting patient's functional outcome.   REHAB POTENTIAL: Excellent  CLINICAL DECISION MAKING: Evolving/moderate complexity  EVALUATION COMPLEXITY: Moderate   GOALS: Goals reviewed with patient?  Yes  SHORT TERM GOALS: Target date: 09/13/2022   Patient will be independent with initial HEP. Baseline: given Goal status: MET 09/13/22 -good compliance  2.  Patient will be educated on strategies to decrease risk of falls.  Baseline: educated today on importance of night lights, tacking down throw rugs, sitting/standing up slowly after supine.  Goal status: MET   LONG TERM GOALS: Target date: 10/11/2022   Patient will be independent with advanced/ongoing HEP to improve outcomes and carryover.  Baseline: needs progression Goal status: IN PROGRESS  3.  Patient will demonstrate improved functional LE strength as demonstrated by 5/5 LLE strength. Baseline: see objective Goal status: IN PROGRESS  4.  Patient will demonstrate 25/30 on FGA to decrease risk of falls.  Baseline: 21/30 Goal status: IN PROGRESS  5.  Patient will score 120/120 on mCTSIB to demonstrate equal weighting of all balance systems.   Baseline: 89/120 Goal status: IN PROGRESS  6.  Patient will report 100% confidence on ABC scale to demonstrate improved functional ability. Baseline: 92.5% Goal status: IN PROGRESS  7.  Patient will be able to maintain SLS x 15 sec bil to lower risk of falls.   Baseline: <5 sec Goal status: IN PROGRESS    PLAN: PT FREQUENCY: 1-2x/week  PT DURATION: 6 weeks  PLANNED INTERVENTIONS: Therapeutic exercises, Therapeutic activity, Neuromuscular re-education, Balance training, Gait training, Patient/Family education, Self Care, Joint mobilization, Vestibular training, Cryotherapy, Moist heat, Manual therapy, and  Re-evaluation  PLAN FOR NEXT SESSION: progress balance training.    Artist Pais, PTA 09/20/2022, 9:30 AM

## 2022-09-22 ENCOUNTER — Ambulatory Visit (HOSPITAL_BASED_OUTPATIENT_CLINIC_OR_DEPARTMENT_OTHER)
Admission: RE | Admit: 2022-09-22 | Discharge: 2022-09-22 | Disposition: A | Discharge status: Home / Self Care | Payer: Medicare Other | Source: Ambulatory Visit | Attending: Family | Admitting: Family

## 2022-09-22 DIAGNOSIS — K760 Fatty (change of) liver, not elsewhere classified: Secondary | ICD-10-CM | POA: Diagnosis not present

## 2022-09-22 DIAGNOSIS — K769 Liver disease, unspecified: Secondary | ICD-10-CM | POA: Insufficient documentation

## 2022-09-22 DIAGNOSIS — K5730 Diverticulosis of large intestine without perforation or abscess without bleeding: Secondary | ICD-10-CM | POA: Diagnosis not present

## 2022-09-22 MED ORDER — GADOBUTROL 1 MMOL/ML IV SOLN
10.0000 mL | Freq: Once | INTRAVENOUS | Status: AC | PRN
  Administered 2022-09-22: 10 mL via INTRAVENOUS

## 2022-09-24 ENCOUNTER — Ambulatory Visit (INDEPENDENT_AMBULATORY_CARE_PROVIDER_SITE_OTHER): Payer: Medicare Other | Admitting: Family

## 2022-09-24 DIAGNOSIS — E782 Mixed hyperlipidemia: Secondary | ICD-10-CM

## 2022-09-24 DIAGNOSIS — Z8739 Personal history of other diseases of the musculoskeletal system and connective tissue: Secondary | ICD-10-CM | POA: Diagnosis not present

## 2022-09-24 DIAGNOSIS — I10 Essential (primary) hypertension: Secondary | ICD-10-CM

## 2022-09-24 DIAGNOSIS — I25119 Atherosclerotic heart disease of native coronary artery with unspecified angina pectoris: Secondary | ICD-10-CM

## 2022-09-24 DIAGNOSIS — K219 Gastro-esophageal reflux disease without esophagitis: Secondary | ICD-10-CM | POA: Diagnosis not present

## 2022-09-24 DIAGNOSIS — E119 Type 2 diabetes mellitus without complications: Secondary | ICD-10-CM

## 2022-09-24 DIAGNOSIS — E088 Diabetes mellitus due to underlying condition with unspecified complications: Secondary | ICD-10-CM | POA: Diagnosis not present

## 2022-09-24 MED ORDER — HYDROCHLOROTHIAZIDE 25 MG PO TABS: 25.0000 mg | ORAL_TABLET | Freq: Every day | ORAL | 1 refills | Status: DC

## 2022-09-24 MED ORDER — METFORMIN HCL 1000 MG PO TABS: ORAL_TABLET | ORAL | 1 refills | Status: DC

## 2022-09-24 NOTE — Assessment & Plan Note (Signed)
Lab Results  Component Value Date   CHOL 158 03/30/2022   HDL 44 03/30/2022   LDLCALC 96 03/30/2022   TRIG 97 03/30/2022   CHOLHDL 3.6 03/30/2022   At Goal on lipitor/zetia.

## 2022-09-24 NOTE — Assessment & Plan Note (Signed)
Stable with prn colchicine.

## 2022-09-24 NOTE — Assessment & Plan Note (Signed)
BP Readings from Last 3 Encounters:  09/24/22 (!) 151/81  08/29/22 (!) 142/80  07/27/22 (!) 140/92   Uncontrolled.  Add hctz '25mg'$  once daily.

## 2022-09-24 NOTE — Patient Instructions (Signed)
Please add hctz '25mg'$  once daily for blood pressure.

## 2022-09-24 NOTE — Assessment & Plan Note (Signed)
Improved.  Only using pepcid prn.

## 2022-09-24 NOTE — Progress Notes (Signed)
Subjective:   By signing my name below, I, [name], attest that this documentation has been prepared under the direction and in the presence of [name], NP 09/24/2022      Patient ID: Steven Payne, male    DOB: 10/12/1946, 76 y.o.   MRN: [id number]  Chief Complaint  Patient presents with  . Diabetes    Here for follow up, "will like to talk about metformin"  . Gas    Complains of passing more gas in the last few months.     Diabetes  Patient is in today for a office visit.   Gas- He complains of feeling gassy recently. He recently cut out white bread and started eating more green leafy vegetables including cabbage and brussels sprouts.   Liver- His wife told him that metformin could damage his liver. He is inquiring if his liver function is normal. Otherwise he continues taking 1000 mg metformin 2x daily PO and reports no new issues while taking it.  Lab Results  Component Value Date   ALT 6 03/30/2022   AST 14 03/30/2022   ALKPHOS 99 03/30/2022   BILITOT 0.5 03/30/2022   Lab Results  Component Value Date   HGBA1C 6.3 08/29/2022   Blood pressure- He continues measuring his blood pressure regularly at home and reports it has been measuring elevated. He continues taking 300 mg diltiazem daily PO and reports no new issues while taking it.  BP Readings from Last 3 Encounters:  09/24/22 (!) 151/81  08/29/22 (!) 142/80  07/27/22 (!) 140/92   Pulse Readings from Last 3 Encounters:  09/24/22 (!) 59  08/29/22 65  07/27/22 93   Cholesterol- He continues taking 80 mg Lipitor daily PO, 10 mg Zetia daily PO and reports no new issues while taking them.  Lab Results  Component Value Date   CHOL 158 03/30/2022   HDL 44 03/30/2022   LDLCALC 96 03/30/2022   TRIG 97 03/30/2022   CHOLHDL 3.6 03/30/2022   Gout- He has no recent flare ups. He continues taking 0.6 mg colchicine as needed.   Pepcid- He continues taking 20 mg Pepcid as needed and reports no new  issues while taking it.   CPAP- He continues wearing a CPAP machine regularly and reports feeling slight discomfort while wearing it but he can manage it at this time. He is not interested in wearing a inspire device.    Health Maintenance Due  Topic Date Due  . Diabetic kidney evaluation - Urine ACR  03/11/2018  . COVID-19 Vaccine (5 - Pfizer risk series) 11/13/2021  . OPHTHALMOLOGY EXAM  07/26/2022    Past Medical History:  Diagnosis Date  . Arthritis   . 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  . Atherosclerosis of native coronary artery of native heart with angina pectoris ([place]) 02/23/2016  . Coronary artery disease cardiologist-  dr Irish Lack   per cardiac cath 06/ 2013--  no sig. cad, lvef 60%, sluggish ectatic coronary artery flow of lad, lcx, rc  . 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 Diabetes mellitus due to underlying condition with unspecified complications ([place]) 31/49/7026  . Diabetes type 2, controlled ([place]) 02/23/2016  . ED (erectile dysfunction)   . Flatulence 09/29/2021  . GERD (gastroesophageal reflux disease)   . Gout 02/23/2016  . History of COVID-19 05/24/2021  . History of gout    approx 2002  . HTN (hypertension) 02/23/2016  . Hyperlipidemia 02/23/2016  . Hypogonadism male   . Left hydrocele   .  Morbid obesity ([place]) 07/19/2021  . OSA (obstructive sleep apnea) 05/24/2021  . 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 APPENDECTOMY  age 2's  . CARDIAC CATHETERIZATION  05-08-2012  dr Elonda Husky at [facility]   no significant cad, sluggish ectatic coronary artery flow (rca, lad, lcx), normal LVSF, ef 60%  . CATARACT EXTRACTION  11/30/2021  . HEMORROIDECTOMY  1985 ?   ??  . HYDROCELE EXCISION Left 09/10/2016   Procedure: HYDROCELECTOMY ADULT;  Surgeon: Kathie Rhodes, MD;  Location: [facility];  Service: Urology;  Laterality: Left;  . UMBILICAL HERNIA REPAIR  1980's    Family History  Problem Relation Age of Onset  . Coronary artery disease Father   . Diabetes Father   . Prostate cancer Father        died at 60  . Alcohol abuse Father   . Coronary artery disease Mother        died age 33  . Alcohol abuse Mother   . Cancer Mother        unsure, + thyroid cancer  . Breast cancer Paternal Grandmother     Social History   Socioeconomic History  . Marital status: Married    Spouse name: Not on file  . Number of children: Not on file  . Years of education: Not on file  . Highest education level: Not on file  Occupational History  . Not on file  Tobacco Use  . Smoking status: Former    Years: 2.00    Types: Cigarettes    Quit date: 03/27/1991    Years since quitting: 31.5  . Smokeless tobacco: Never  . Tobacco comments:    light smoker (smoked on 2 cigarettes per day for almost 2 years)  Vaping Use  . Vaping Use: Never used  Substance and Sexual Activity  . Alcohol use: No    Alcohol/week: 0.0 standard drinks of alcohol  . Drug use: No  . Sexual activity: Not on file  Other Topics Concern  . 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  No grandchildren.  Oldest in [place], youngest in [place]   Completed bachelors, some graduate (Estate manager/land agent)   Enjoys travelling, walking, cruises   Social Determinants of Health   Financial Resource Strain: Low Risk  (01/01/2022)   Overall Financial Resource Strain (CARDIA)   . Difficulty of Paying Living Expenses: Not hard at all  Food Insecurity: No Food Insecurity (01/01/2022)   Hunger Vital Sign   . Worried About Charity fundraiser in the Last Year: Never true   . Ran Out of Food in the Last Year: Never true  Transportation  Needs: No Transportation Needs (01/01/2022)   PRAPARE - Transportation   . Lack of Transportation (Medical): No   . Lack of Transportation (Non-Medical): No  Physical Activity: Sufficiently Active (01/01/2022)   Exercise Vital Sign   . Days of Exercise per Week: 3 days   . Minutes of Exercise per Session: 60 min  Stress: No Stress Concern Present (01/01/2022)   [place]   . Feeling of Stress :  Not at all  Social Connections: Moderately Integrated (01/01/2022)   Social Connection and Isolation Panel [NHANES]   . Frequency of Communication with Friends and Family: More than three times a week   . Frequency of Social Gatherings with Friends and Family: Twice a week   . Attends Religious Services: More than 4 times per year   . Active Member of Clubs or Organizations: No   . Attends Archivist Meetings: Never   . Marital Status: Married  Human resources officer Violence: Not At Risk (01/01/2022)   Humiliation, Afraid, Rape, and Kick questionnaire   . Fear of Current or Ex-Partner: No   . Emotionally Abused: No   . Physically Abused: No   . Sexually Abused: No    Outpatient Medications Prior to Visit  Medication Sig Dispense Refill  . atorvastatin (LIPITOR) 80 MG tablet TAKE 1 TABLET DAILY 90 tablet 3  . clobetasol cream (TEMOVATE) 7.56 % Apply 1 application topically daily.    . clopidogrel (PLAVIX) 75 MG tablet Take 1 tablet (75 mg total) by mouth daily. 90 tablet 1  . colchicine 0.6 MG tablet Take 1 tablet (0.6 mg total) by mouth daily as needed (Gout). gout 20 tablet 0  . COVID-19 mRNA vaccine 2023-2024 (COMIRNATY) SUSP injection Inject into the muscle. 0.3 mL 0  . diltiazem (TIAZAC) 300 MG 24 hr capsule TAKE 1 CAPSULE EVERY MORNING 90 capsule 3  . famotidine (PEPCID) 20 MG tablet TAKE 1 TABLET TWICE A DAY 180 tablet 1  . FREESTYLE LITE test strip TEST UP TO FOUR TIMES DAILY 300 strip 3  . Lancets (FREESTYLE)  lancets TEST UP TO FOUR TIMES DAILY 300 each 3  . RESTASIS 0.05 % ophthalmic emulsion Place 1 drop into both eyes daily.    . sacubitril-valsartan (ENTRESTO) 24-26 MG Take 1 tablet by mouth 2 (two) times daily. Needs appointment for future refills 180 tablet 0  . Testosterone 1.62 % GEL Place 1 application onto the skin daily.    . metFORMIN (GLUCOPHAGE) 1000 MG tablet TAKE 1 TABLET TWICE A DAY WITH MEALS ( THIS SHOULD REPLACE 500 MG TWICE A DAY ) 180 tablet 1  . ezetimibe (ZETIA) 10 MG tablet Take 1 tablet (10 mg total) by mouth daily. 90 tablet 3   No facility-administered medications prior to visit.    Allergies  Allergen Reactions  . Tanzeum [Albiglutide] Other (See Comments)    constipation    Review of Systems  Gastrointestinal:        (+)gas       Objective:    Physical Exam Constitutional:      General: He is not in acute distress.    Appearance: Normal appearance. He is not ill-appearing.  HENT:     Head: Normocephalic and atraumatic.     Right Ear: External ear normal.     Left Ear: External ear normal.  Eyes:     Extraocular Movements: Extraocular movements intact.     Pupils: Pupils are equal, round, and reactive to light.  Cardiovascular:     Rate and Rhythm: Normal rate and regular rhythm.     Heart sounds: Normal heart sounds. No murmur heard.    No gallop.  Pulmonary:     Effort: Pulmonary effort is normal. No respiratory distress.     Breath sounds: Normal breath sounds. No wheezing or rales.  Skin:    General: Skin is warm and dry.  Neurological:     Mental Status: He is alert and oriented  to person, place, and time.  Psychiatric:        Judgment: Judgment normal.    BP (!) 151/81 (BP Location: Right Arm, Patient Position: Sitting, Cuff Size: Small)   Pulse (!) 59   Temp 97.8 F (36.6 C) (Oral)   Resp 16   Wt 246 lb (111.6 kg)   SpO2 100%   BMI 39.71 kg/m  Wt Readings from Last 3 Encounters:  09/24/22 246 lb (111.6 kg)  08/29/22 251 lb  6.4 oz (114 kg)  07/27/22 255 lb (115.7 kg)       Assessment & Plan:   Problem List Items Addressed This Visit       Unprioritized   Hyperlipidemia    Lab Results  Component Value Date   CHOL 158 03/30/2022   HDL 44 03/30/2022   LDLCALC 96 03/30/2022   TRIG 97 03/30/2022   CHOLHDL 3.6 03/30/2022  At Goal on lipitor/zetia.       Relevant Medications   hydrochlorothiazide (HYDRODIURIL) 25 MG tablet   HTN (hypertension)    BP Readings from Last 3 Encounters:  09/24/22 (!) 151/81  08/29/22 (!) 142/80  07/27/22 (!) 140/92  Uncontrolled.  Add hctz '25mg'$  once daily.       Relevant Medications   hydrochlorothiazide (HYDRODIURIL) 25 MG tablet   History of gout    Stable with prn colchicine.       GERD (gastroesophageal reflux disease)    Improved.  Only using pepcid prn.       Diabetes type 2, controlled ([place])   Relevant Medications   metFORMIN (GLUCOPHAGE) 1000 MG tablet   Diabetes mellitus due to underlying condition with unspecified complications (HCC)    K2H is at goal. Continue metformin.       Relevant Medications   metFORMIN (GLUCOPHAGE) 1000 MG tablet     Meds ordered this encounter  Medications  . hydrochlorothiazide (HYDRODIURIL) 25 MG tablet    Sig: Take 1 tablet (25 mg total) by mouth daily.    Dispense:  90 tablet    Refill:  1    Order Specific Question:   Supervising Provider    Answer:   Penni Homans A [[id number]]  . metFORMIN (GLUCOPHAGE) 1000 MG tablet    Sig: TAKE 1 TABLET TWICE A DAY WITH MEALS ( THIS SHOULD REPLACE 500 MG TWICE A DAY )    Dispense:  180 tablet    Refill:  1    Order Specific Question:   Supervising Provider    Answer:   Penni Homans A [[id number]]    I, Nance Pear, NP, personally preformed the services described in this documentation.  All medical record entries made by the scribe were at my direction and in my presence.  I have reviewed the chart and discharge instructions (if applicable) and agree that the record  reflects my personal performance and is accurate and complete. 09/24/2022   I,[name],acting as a Education administrator for Nance Pear, NP.,have documented all relevant documentation on the behalf of Nance Pear, NP,as directed by  Nance Pear, NP while in the presence of Nance Pear, NP.   Nance Pear, NP

## 2022-09-24 NOTE — Assessment & Plan Note (Signed)
A1C is at goal. Continue metformin.

## 2022-09-25 ENCOUNTER — Ambulatory Visit: Payer: Medicare Other

## 2022-09-25 ENCOUNTER — Other Ambulatory Visit (HOSPITAL_BASED_OUTPATIENT_CLINIC_OR_DEPARTMENT_OTHER): Payer: Self-pay

## 2022-09-25 DIAGNOSIS — R296 Repeated falls: Secondary | ICD-10-CM

## 2022-09-25 DIAGNOSIS — M6281 Muscle weakness (generalized): Secondary | ICD-10-CM | POA: Diagnosis not present

## 2022-09-25 DIAGNOSIS — R2681 Unsteadiness on feet: Secondary | ICD-10-CM | POA: Diagnosis not present

## 2022-09-25 MED ORDER — AREXVY 120 MCG/0.5ML IM SUSR
INTRAMUSCULAR | 0 refills | Status: DC
  Filled 2022-09-25: qty 0.5, 1d supply, fill #0

## 2022-09-25 NOTE — Therapy (Signed)
OUTPATIENT PHYSICAL THERAPY TREATMENT  Patient Name: Steven Payne MRN: [id number] DOB:[birth year], 76 y.o., male Today's Date: 09/25/2022   PT End of Session - 09/25/22 0930     Visit Number 5    Number of Visits 12    Date for PT Re-Evaluation 10/11/22    Authorization Type Medicare + Tricare    Progress Note Due on Visit 10    PT Start Time 0843    PT Stop Time 0926    PT Time Calculation (min) 43 min    Activity Tolerance Patient tolerated treatment well    Behavior During Therapy [facility] for tasks assessed/performed               Past Medical History:  Diagnosis Date   Arthritis    Ascending aorta dilatation ([place]) 07/19/2021   Atherosclerosis of native coronary artery of native heart with angina pectoris ([place]) 02/23/2016   Coronary artery disease cardiologist-  dr Irish Lack   per cardiac cath 06/ 2013--  no sig. 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   no significant cad, sluggish ectatic coronary artery flow (rca, lad, lcx), normal LVSF, ef 60%   CATARACT EXTRACTION  11/30/2021    HEMORROIDECTOMY  1985 ?   ??   HYDROCELE EXCISION Left 09/10/2016   Procedure: HYDROCELECTOMY ADULT;  Surgeon: Kathie Rhodes, MD;  Location: [facility];  Service: Urology;  Laterality: Left;   UMBILICAL HERNIA REPAIR  1980's   Patient Active Problem List   Diagnosis Date Noted   History of gout 01/16/2022   Coronary artery disease 08/15/2021   Ascending aorta dilatation ([place]) 07/19/2021   Morbid obesity ([place]) 07/19/2021   Arthritis 07/17/2021   Hypogonadism male 07/17/2021   Left hydrocele 07/17/2021   OSA (obstructive sleep apnea) 05/24/2021   History of COVID-19 05/24/2021   Sweat gland tumor 2021   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   Diabetes mellitus due to underlying condition with unspecified complications ([place]) 62/56/3893   ED (erectile dysfunction) 03/11/2017   Diabetes type 2, controlled ([place]) 02/23/2016   HTN (hypertension) 02/23/2016   Hyperlipidemia 02/23/2016   GERD (gastroesophageal reflux disease) 02/23/2016    PCP: [name], NP  REFERRING PROVIDER: [name], NP  REFERRING DIAG: R26.89 (ICD-10-CM) - Poor balance  THERAPY DIAG:  Repeated falls  Unsteadiness on feet  Muscle weakness (generalized)  Rationale for Evaluation and Treatment Rehabilitation  ONSET DATE: couple of years  SUBJECTIVE:   SUBJECTIVE STATEMENT: Pt reports he is doing good today.  PERTINENT HISTORY: HTN, CAD, obesity, T2DM, gout  PAIN:  Are  you having pain? No  PRECAUTIONS: Fall  WEIGHT BEARING RESTRICTIONS No  FALLS:  Has patient fallen in last 6 months? Yes. Number of falls at least 10  LIVING ENVIRONMENT: Lives with: lives with their spouse Lives in: House/apartment Stairs: Yes: External: 2 steps; none Has following equipment at home: Single point cane and None  OCCUPATION: retired, former Psychologist, clinical)   PLOF: Independent, goes to gym 4x/week (treadmill, light lifting), enjoys travel  [place] not  fall   OBJECTIVE:   DIAGNOSTIC FINDINGS: no recent imaging  PATIENT SURVEYS:  ABC scale 92.5%  COGNITION:  Overall cognitive status: Within functional limits for tasks assessed     SENSATION: WFL  EDEMA:  NA  MUSCLE LENGTH: NT  POSTURE: rounded shoulders, forward head, and increased thoracic kyphosis  PALPATION: NA  LOWER EXTREMITY MMT:  MMT Right eval Left eval  Hip flexion 5 4+  Hip extension 5 4+  Hip abduction 5 5  Hip adduction 5 5  Knee flexion 5 4+  Knee extension 5 5  Ankle dorsiflexion 5 5  Ankle plantarflexion 5 5  Ankle inversion    Ankle eversion     (Blank rows = not tested)  LOWER EXTREMITY ROM:  NT  CERVICAL ROM:  AROM eval  Cervical Flexion 50  Cervical Extension 60  Cervical Rotation to Left 75  Cervical Rotation to Right 75  Left Sidebend 30  Right Sidebend 40    LOWER EXTREMITY SPECIAL TESTS:  NT   VESTIBULAR ASSESSMENT    SYMPTOM BEHAVIOR:   Subjective history: patient was in artillery, 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  Feels that it has affected his balance.  Now he is getting headaches as well, and then he starts feeling dizzy   Non-Vestibular symptoms: headaches and tinnitus   Type of dizziness: Imbalance (Disequilibrium) and "Swimmyheaded" - feels like waves   Frequency: 1-2x/week   Duration: 30 min to hour   Aggravating factors:  loud noise   Relieving factors: lying supine and advil for headache   Progression of symptoms: worse   OCULOMOTOR EXAM:   Ocular Alignment: normal   Ocular ROM: No Limitations   Spontaneous Nystagmus: absent   Gaze-Induced Nystagmus: age appropriate nystagmus at end range   Smooth Pursuits: saccades   Saccades: hypometric/undershoots with horizontal head movements.      VESTIBULAR - OCULAR REFLEX:    Slow VOR: Normal   VOR Cancellation: Normal   Head-Impulse Test: HIT Right: negative HIT Left: negative   Dynamic Visual Acuity:  NT    POSITIONAL TESTING: Right  Dix-Hallpike: no nystagmus Left Dix-Hallpike: no nystagmus Right Roll Test: geotropic nystagmus Left Roll Test: no nystagmus    MOTION SENSITIVITY:    Motion Sensitivity Quotient  Intensity: 0 = none, 1 = Lightheaded, 2 = Mild, 3 = Moderate, 4 = Severe, 5 = Vomiting  Intensity  1. Sitting to supine   2. Supine to L side   3. Supine to R side   4. Supine to sitting   5. L Hallpike-Dix 0  6. Up from L  0  7. R Hallpike-Dix 0  8. Up from R  0  9. Sitting, head  tipped to L knee 0  10. Head up from L  knee 0  11. Sitting, head  tipped to R knee 0  12. Head up from R  knee 0  13. Sitting head turns x5 0  14.Sitting head nods x5 0  15. In stance, 180  turn to  L    16. In stance, 180  turn to R      FUNCTIONAL TESTS:  5 times sit to stand: 6 seconds Timed up and go (TUG): 7 seconds Berg Balance Scale: 54/56 Dynamic Gait Index: 22/24 Functional gait assessment: 21/30 MCTSIB: Condition 1: Avg of 3 trials: 30 sec, Condition 2: Avg of 3 trials: 8 sec, Condition 3: Avg of 3 trials: 30 sec, Condition 4: Avg of 3 trials: 21 sec, and Total Score: 89/120   GAIT: Distance walked: 300' Assistive device utilized: None Level of assistance: Complete Independence Comments: wide BOS, downward gaze   Gait speed 1.04 m/s typical, 1.21 m/s fast speed   TODAY'S TREATMENT: 09/25/22 Therapeutic Exercise: to improve strength and mobility.  Demo, verbal and tactile cues throughout for technique. Nustep L6x101min L lateral step ups 8' x 10  Step ups 8' x20 bil Squats at counter x 10  Standing hip abduction, flexion and extension x 10 bil Bird dog x 10  Neuromuscular Reeducation: to improve balance and stability. SBA for safety throughout.  In corner on airex: Eyes open narrow BOS x 30 sec  EO head turns x 10 bil Trunk rotations x 10 bil Tandem walk 6x10 ft  09/20/22 Therapeutic Exercise: to improve strength and mobility.  Demo, verbal and tactile cues throughout for  technique. Nustep L6x47min Standing heel raise/toe raise x 20 Church pew x 10 Squats at counter x 10 Standing hip abduction and ext at counter x 10 each  Neuromuscular Reeducation: to improve balance and stability. SBA for safety throughout.  In corner on airex: Eyes open feet apart x 30 sec  EO head turns x 10 bil Tandem stance 2x30 sec Tandem walk 4x 10 ft   09/06/2022  Therapeutic Exercise: to improve strength and mobility.  Demo, verbal and tactile cues throughout for technique.  Nustep L6 x 6 min   Church pews x 10  Review of HEP Neuromuscular Reeducation: to improve balance and stability. SBA for safety throughout.  In corner on airex: Eyes open feet apart x 30 sec  Eyes open feet apart with head nods x 10 Eyes open feet apart with head turns x 10 Eyes closed feet apart x 30 sec In corner on level surface: Tandem stance x 30 sec bil Eyes closed feet together x 30 sec Eyes closed feet together with head nods x 10 Eyes closed feet together with head turns x 10  At counter:  -intermittent UE support needed for balance.  Forward step and reach x 10 bil  Side step and turn x 10 bil  Manual Therapy: to decrease muscle spasm and improve mobility.  STM/TPR to cervical paraspinals, PA mobs and NAGs into rotation.      PATIENT EDUCATION:  Education details: HEP update 10/12 Person educated: Patient Education method: Explanation, Demonstration, and Handouts Education comprehension: verbalized understanding   HOME EXERCISE PROGRAM: Access Code: [email or web address]  ASSESSMENT:  CLINICAL IMPRESSION: Pt responded well to treatment. He reports only mild dizziness at this point. We were able to progress with balance and proximal LE strengthening. He demonstrated a better performance of the balance activities on airex so we progressed with narrow BOS and trunk rotations. He showed more difficulty with lateral step ups using L LE, required instruction to use UE support d/t instability. He was  able to do SLS on both legs but for a max of 8 sec, he is progressing.   OBJECTIVE IMPAIRMENTS decreased balance, decreased strength, impaired perceived functional ability, and postural dysfunction.  ACTIVITY LIMITATIONS bending and locomotion level  PARTICIPATION LIMITATIONS: community activity and yard work  PERSONAL FACTORS 3+ comorbidities: obesity, T2DM, gout, CAD, HTN  are also affecting patient's functional outcome.   REHAB POTENTIAL: Excellent  CLINICAL DECISION MAKING: Evolving/moderate complexity  EVALUATION COMPLEXITY: Moderate   GOALS: Goals reviewed with patient? Yes  SHORT TERM GOALS: Target date: 09/13/2022   Patient will be independent with initial HEP. Baseline: given Goal status: MET 09/13/22 -good compliance  2.  Patient will be educated on strategies to decrease risk of falls.  Baseline: educated today on importance of night lights, tacking down throw rugs, sitting/standing up slowly after supine.  Goal status: MET   LONG TERM GOALS: Target date: 10/11/2022   Patient will be independent with advanced/ongoing HEP to improve outcomes and carryover.  Baseline: needs progression Goal status: IN PROGRESS  3.  Patient will demonstrate improved functional LE strength as demonstrated by 5/5 LLE strength. Baseline: see objective Goal status: IN PROGRESS  4.  Patient will demonstrate 25/30 on FGA to decrease risk of falls.  Baseline: 21/30 Goal status: IN PROGRESS  5.  Patient will score 120/120 on mCTSIB to demonstrate equal weighting of all balance systems.   Baseline: 89/120 Goal status: IN PROGRESS  6.  Patient will report 100% confidence on ABC scale to demonstrate improved functional ability. Baseline: 92.5% Goal status: IN PROGRESS  7.  Patient will be able to maintain SLS x 15 sec bil to lower risk of falls.   Baseline: <5 sec Goal status: IN PROGRESS    PLAN: PT FREQUENCY: 1-2x/week  PT DURATION: 6 weeks  PLANNED INTERVENTIONS:  Therapeutic exercises, Therapeutic activity, Neuromuscular re-education, Balance training, Gait training, Patient/Family education, Self Care, Joint mobilization, Vestibular training, Cryotherapy, Moist heat, Manual therapy, and Re-evaluation  PLAN FOR NEXT SESSION: progress balance training.    Artist Pais, PTA 09/25/2022, 9:30 AM

## 2022-09-27 ENCOUNTER — Encounter: Payer: Medicare Other | Admitting: Physical Therapy

## 2022-10-02 ENCOUNTER — Ambulatory Visit: Payer: Medicare Other | Admitting: Physical Therapy

## 2022-10-04 ENCOUNTER — Ambulatory Visit: Payer: Medicare Other | Attending: Family | Admitting: Physical Therapy

## 2022-10-04 ENCOUNTER — Encounter: Payer: Self-pay | Admitting: Physical Therapy

## 2022-10-04 DIAGNOSIS — M6281 Muscle weakness (generalized): Secondary | ICD-10-CM

## 2022-10-04 DIAGNOSIS — R2681 Unsteadiness on feet: Secondary | ICD-10-CM | POA: Diagnosis not present

## 2022-10-04 DIAGNOSIS — R296 Repeated falls: Secondary | ICD-10-CM

## 2022-10-04 NOTE — Therapy (Signed)
OUTPATIENT PHYSICAL THERAPY TREATMENT  Patient Name: Steven Payne MRN: [id number] DOB:Nov 13, 1946, 76 y.o., male Today's Date: 10/04/2022   PT End of Session - 10/04/22 0847     Visit Number 6    Number of Visits 12    Date for PT Re-Evaluation 10/11/22    Authorization Type Medicare + Tricare    Progress Note Due on Visit 10    PT Start Time [phone]    PT Stop Time 0930    PT Time Calculation (min) 44 min    Activity Tolerance Patient tolerated treatment well    Behavior During Therapy [facility] for tasks assessed/performed               Past Medical History:  Diagnosis Date   Arthritis    Ascending aorta dilatation ([place]) 07/19/2021   Atherosclerosis of native coronary artery of native heart with angina pectoris ([place]) 02/23/2016   Coronary artery disease cardiologist-  dr Irish Lack   per cardiac cath 06/ 2013--  no sig. 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   Diabetes mellitus due to underlying condition with unspecified complications ([place]) 58/52/7782   Diabetes type 2, controlled ([place]) 02/23/2016   ED (erectile dysfunction)    Flatulence 09/29/2021   GERD (gastroesophageal reflux disease)    Gout 02/23/2016   History of COVID-19 05/24/2021   History of gout    approx 2002   HTN (hypertension) 02/23/2016   Hyperlipidemia 02/23/2016   Hypogonadism male    Left hydrocele    Morbid obesity ([place]) 07/19/2021   OSA (obstructive sleep apnea) 05/24/2021   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   ??   HYDROCELE EXCISION Left 09/10/2016   Procedure: HYDROCELECTOMY ADULT;  Surgeon: Kathie Rhodes, MD;  [facility];  Service: Urology;  Laterality: Left;   UMBILICAL HERNIA REPAIR  1980's   Patient Active Problem List   Diagnosis Date Noted   History of gout 01/16/2022   Coronary artery disease 08/15/2021   Ascending aorta dilatation ([place]) 07/19/2021   Morbid obesity ([place]) 07/19/2021   Arthritis 07/17/2021   Hypogonadism male 07/17/2021   Left hydrocele 07/17/2021   OSA (obstructive sleep apnea) 05/24/2021   History of COVID-19 05/24/2021   Sweat gland tumor 2021   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   Diabetes mellitus due to underlying condition with unspecified complications ([place]) 42/35/3614   ED (erectile dysfunction) 03/11/2017   Diabetes type 2, controlled ([place]) 02/23/2016   HTN (hypertension) 02/23/2016   Hyperlipidemia 02/23/2016   GERD (gastroesophageal reflux disease) 02/23/2016    PCP: [name], NP  REFERRING PROVIDER: [name], NP  REFERRING DIAG: R26.89 (ICD-10-CM) - Poor balance  THERAPY DIAG:  Repeated falls  Unsteadiness on feet  Muscle weakness (generalized)  Rationale for Evaluation and Treatment Rehabilitation  ONSET DATE: couple of years  SUBJECTIVE:   SUBJECTIVE STATEMENT: Pt reports he is doing well.  He reports no falls, no more dizziness, no headache, but still has  tinnitus.  PERTINENT HISTORY: HTN, CAD, obesity, T2DM, gout  PAIN:  Are you having pain? No  PRECAUTIONS: Fall  WEIGHT BEARING RESTRICTIONS No  FALLS:  Has patient fallen in last 6 months? Yes. Number of falls at least 10  LIVING ENVIRONMENT: Lives with: lives with their spouse Lives in: House/apartment Stairs: Yes: External: 2 steps; none Has following equipment at home: Single point cane and None  OCCUPATION: retired, former Psychologist, clinical)   PLOF: Independent, goes to gym  4x/week (treadmill, light lifting), enjoys travel  [place] not fall   OBJECTIVE:   DIAGNOSTIC FINDINGS: no recent imaging  PATIENT SURVEYS:  ABC scale 92.5%  COGNITION:  Overall cognitive status: Within functional limits for tasks assessed     SENSATION: WFL  EDEMA:  NA  MUSCLE LENGTH: NT  POSTURE: rounded shoulders, forward head, and increased thoracic kyphosis  PALPATION: NA  LOWER EXTREMITY MMT:  MMT Right eval Left eval  Hip flexion 5 4+  Hip extension 5 4+  Hip abduction 5 5  Hip adduction 5 5  Knee flexion 5 4+  Knee extension 5 5  Ankle dorsiflexion 5 5  Ankle plantarflexion 5 5  Ankle inversion    Ankle eversion     (Blank rows = not tested)  LOWER EXTREMITY ROM:  NT  CERVICAL ROM:  AROM eval  Cervical Flexion 50  Cervical Extension 60  Cervical Rotation to Left 75  Cervical Rotation to Right 75  Left Sidebend 30  Right Sidebend 40    LOWER EXTREMITY SPECIAL TESTS:  NT   VESTIBULAR ASSESSMENT    SYMPTOM BEHAVIOR:   Subjective history: patient was in artillery, 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  Feels that it has affected his balance.  Now he is getting headaches as well, and then he starts feeling dizzy   Non-Vestibular symptoms: headaches and tinnitus   Type of dizziness: Imbalance (Disequilibrium) and "Swimmyheaded" - feels like waves   Frequency: 1-2x/week   Duration: 30 min to hour   Aggravating factors:  loud noise   Relieving factors: lying supine and advil for headache   Progression of symptoms: worse   OCULOMOTOR EXAM:   Ocular Alignment: normal   Ocular ROM: No Limitations   Spontaneous Nystagmus: absent   Gaze-Induced Nystagmus: age appropriate nystagmus at end range   Smooth Pursuits: saccades   Saccades: hypometric/undershoots with horizontal head movements.      VESTIBULAR - OCULAR REFLEX:    Slow VOR: Normal   VOR Cancellation: Normal   Head-Impulse Test: HIT Right: negative HIT Left:  negative   Dynamic Visual Acuity:  NT    POSITIONAL TESTING: Right Dix-Hallpike: no nystagmus Left Dix-Hallpike: no nystagmus Right Roll Test: geotropic nystagmus Left Roll Test: no nystagmus    MOTION SENSITIVITY:    Motion Sensitivity Quotient  Intensity: 0 = none, 1 = Lightheaded, 2 = Mild, 3 = Moderate, 4 = Severe, 5 = Vomiting  Intensity  1. Sitting to supine   2. Supine to L side   3. Supine to R side   4. Supine to sitting   5. L Hallpike-Dix 0  6. Up from L  0  7. R Hallpike-Dix 0  8. Up from R  0  9. Sitting, head  tipped to L knee 0  10. Head up from L  knee 0  11. Sitting, head  tipped to R knee 0  12. Head up from R  knee 0  13. Sitting head turns x5 0  14.Sitting head nods x5 0  15. In stance, 180  turn to L    16. In stance, 180  turn to R      FUNCTIONAL TESTS:  5 times sit to stand: 6 seconds Timed up and go (TUG): 7 seconds Berg Balance Scale: 54/56 Dynamic Gait Index: 22/24 Functional gait assessment: 21/30 MCTSIB: Condition 1: Avg of 3 trials: 30 sec, Condition 2: Avg of 3 trials: 8 sec, Condition 3: Avg of 3 trials: 30 sec, Condition 4: Avg of 3 trials: 21 sec, and Total Score: 89/120   GAIT: Distance walked: 300' Assistive device utilized: None Level of assistance: Complete Independence Comments: wide BOS, downward gaze   Gait speed 1.04 m/s typical, 1.21 m/s fast speed   TODAY'S TREATMENT: 10/04/2022 Neuromuscular Reeducation: to improve balance and stability. SBA for safety throughout.  Nustep L5 x 7 min On airex in corner: Eyes open feet apart x 30 sec  Eyes closed feet apart x 30 sec Eyes closed feet apart with head nods x 10 Eyes closed feet apart with head turns x 10  Tandem gait - occasional touches on wall for stability L foot forward x 52 sec, R foot forward x 10 sec SLS 2 x 30 sec bil in corner with chair  In hallway: Gait with head turns Gait with head nods Gait with ball catch - both toss and catch by self and from  therapist Side stepping with ball catch Step and reach to cones - increasing distance progressively Alternating steps on stool x 10 bil - no difficulty Perturbations all directions - LOB with first push but otherwise no LOB Star excursion pattern - LOB with cross stepping.    09/25/22 Therapeutic Exercise: to improve strength and mobility.  Demo, verbal and tactile cues throughout for technique. Nustep L6x65mn L lateral step ups 8' x 10  Step ups 8' x20 bil Squats at counter x 10  Standing hip abduction, flexion and extension x 10 bil Bird dog x 10  Neuromuscular Reeducation: to improve balance and stability. SBA for safety throughout.  In corner on airex: Eyes open narrow BOS x 30 sec  EO head turns x 10 bil Trunk rotations x 10 bil Tandem walk 6x10 ft  09/20/22 Therapeutic Exercise: to improve strength and mobility.  Demo, verbal and tactile cues throughout for technique. Nustep L6x661m Standing heel raise/toe raise x 20 Church pew x 10 Squats at counter x 10 Standing hip abduction and ext at counter x 10 each  Neuromuscular Reeducation: to improve balance and stability. SBA for safety throughout.  In corner on airex: Eyes open feet apart x 30 sec  EO head turns x 10 bil Tandem stance 2x30 sec Tandem walk 4x 10 ft    PATIENT EDUCATION:  Education details: HEP update 10/12 Person educated: Patient Education method: Explanation, Demonstration, and Handouts Education comprehension: verbalized understanding   HOME EXERCISE PROGRAM: Access Code: [email or web address]  ASSESSMENT:  CLINICAL IMPRESSION: AnIOANE BHOLAontinues to make good progress in PT, reporting resolution of dizziness and feeling significant improvements in balance.  Today continued to focus on balance exercises including more dynamic exercises with head turns, reaching and ball catch.  He was able to perform all activities without LOB until asked to perform cross over step on star excursion pattern, this was  very challenging needing stepping strategy and minA to recover balance.  AnJoen Lauraontinues to demonstrate potential for improvement and would benefit from continued skilled therapy to address impairments.  OBJECTIVE IMPAIRMENTS decreased balance, decreased strength, impaired perceived functional ability, and postural dysfunction.   ACTIVITY LIMITATIONS bending and locomotion level  PARTICIPATION LIMITATIONS: community activity and yard work  PERSONAL FACTORS 3+ comorbidities: obesity, T2DM, gout, CAD, HTN  are also affecting patient's functional outcome.   REHAB POTENTIAL: Excellent  CLINICAL DECISION MAKING: Evolving/moderate complexity  EVALUATION COMPLEXITY: Moderate   GOALS: Goals reviewed with patient? Yes  SHORT TERM GOALS: Target date: 09/13/2022   Patient will be independent with initial HEP. Baseline: given Goal status: MET 09/13/22 -good compliance  2.  Patient will be educated on strategies to decrease risk of falls.  Baseline: educated today on importance of night lights, tacking down throw rugs, sitting/standing up slowly after supine.  Goal status: MET   LONG TERM GOALS: Target date: 10/11/2022   Patient will be independent with advanced/ongoing HEP to improve outcomes and carryover.  Baseline: needs progression Goal status: IN PROGRESS  3.  Patient will demonstrate improved functional LE strength as demonstrated by 5/5 LLE strength. Baseline: see objective Goal status: IN PROGRESS  4.  Patient will demonstrate 25/30 on FGA to decrease risk of falls.  Baseline: 21/30 Goal status: IN PROGRESS  5.  Patient will score 120/120 on mCTSIB to demonstrate equal weighting of all balance systems.   Baseline: 89/120 Goal status: IN PROGRESS  6.  Patient will report 100% confidence on ABC scale to demonstrate improved functional ability. Baseline: 92.5% Goal status: IN PROGRESS  7.  Patient will be able to maintain SLS x 15 sec bil to lower risk of  falls.   Baseline: <5 sec Goal status: IN PROGRESS    PLAN: PT FREQUENCY: 1-2x/week  PT DURATION: 6 weeks  PLANNED INTERVENTIONS: Therapeutic exercises, Therapeutic activity, Neuromuscular re-education, Balance training, Gait training, Patient/Family education, Self Care, Joint mobilization, Vestibular training, Cryotherapy, Moist heat, Manual therapy, and Re-evaluation  PLAN FOR NEXT SESSION: progress balance training.  Work on cross stepping   Rennie Natter, PT, DPT  10/04/2022, 12:04 PM

## 2022-10-08 ENCOUNTER — Ambulatory Visit (INDEPENDENT_AMBULATORY_CARE_PROVIDER_SITE_OTHER): Payer: Medicare Other | Admitting: Family

## 2022-10-08 VITALS — BP 130/100 | HR 61 | Temp 98.0°F | Resp 16 | Wt 244.0 lb

## 2022-10-08 DIAGNOSIS — I10 Essential (primary) hypertension: Secondary | ICD-10-CM | POA: Diagnosis not present

## 2022-10-08 DIAGNOSIS — I25119 Atherosclerotic heart disease of native coronary artery with unspecified angina pectoris: Secondary | ICD-10-CM

## 2022-10-08 LAB — BASIC METABOLIC PANEL
BUN: 9 mg/dL (ref 6–23)
CO2: 28 mEq/L (ref 19–32)
Calcium: 9.3 mg/dL (ref 8.4–10.5)
Chloride: 104 mEq/L (ref 96–112)
Creatinine, Ser: 0.77 mg/dL (ref 0.40–1.50)
GFR: 86.1 mL/min (ref >60.00)
Glucose, Bld: 102 mg/dL — ABNORMAL HIGH (ref 70–99)
Potassium: 3.7 mEq/L (ref 3.5–5.1)
Sodium: 141 mEq/L (ref 135–145)

## 2022-10-08 NOTE — Progress Notes (Unsigned)
Subjective:     Patient ID: Steven Payne, male    DOB: 04/08/1946, 76 y.o.   MRN: [id number]  Chief Complaint  Patient presents with   Hypertension    Here for follow up    Patient is in today for blood pressure follow up.  Last visit bp was elevated and we added hctz once daily.   BP Readings from Last 3 Encounters:  10/08/22 (!) 130/100  09/24/22 (!) 151/81  08/29/22 (!) 142/80     Health Maintenance Due  Topic Date Due   Diabetic kidney evaluation - Urine ACR  03/11/2018   COVID-19 Vaccine (6 - Pfizer risk series) 11/13/2021   OPHTHALMOLOGY EXAM  07/26/2022    Past Medical History:  Diagnosis Date   Arthritis    Ascending aorta dilatation ([place]) 07/19/2021   Atherosclerosis of native coronary artery of native heart with angina pectoris ([place]) 02/23/2016   Coronary artery disease cardiologist-  dr Irish Lack   per cardiac cath 06/ 2013--  no sig. 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   ??   HYDROCELE EXCISION Left 09/10/2016   Procedure: HYDROCELECTOMY ADULT;  Surgeon: Kathie Rhodes, MD;  Location: [facility];  Service: Urology;  Laterality: Left;   UMBILICAL HERNIA REPAIR  75's    Family History  Problem Relation Age of Onset   Coronary artery disease Father    Diabetes Father    Prostate cancer Father        died at 72   Alcohol abuse Father    Coronary artery disease Mother        died age 54   Alcohol abuse Mother    Cancer Mother        unsure, + thyroid cancer   Breast cancer Paternal Grandmother     Social History   Socioeconomic History   Marital status: Married    Spouse name: Not on file   Number of children: Not on file   Years of education: Not on file   Highest education level: Not on file  Occupational History   Not on file  Tobacco Use   Smoking status: Former    Years: 2.00    Types: Cigarettes    Quit date: 03/27/1991    Years since quitting: 31.5   Smokeless tobacco: Never   Tobacco comments:    light smoker (smoked on 2  cigarettes per day for almost 2 years)  Vaping Use   Vaping Use: Never used  Substance and Sexual Activity   Alcohol use: No    Alcohol/week: 0.0 standard drinks of alcohol   Drug use: No   Sexual activity: Not on file  Other Topics Concern   Not on file  Social History Narrative   Retired Corporate treasurer, 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  No grandchildren.  Oldest in [place], youngest in [place]   Completed bachelors, some graduate (Estate manager/land agent)   Enjoys travelling, walking, cruises   Social Determinants of Health   Financial Resource Strain: [place]  (01/01/2022)   Overall Financial Resource Strain (CARDIA)    Difficulty of Paying Living Expenses: Not hard at all  Food Insecurity: No Food Insecurity (01/01/2022)   Hunger Vital Sign     Worried About Running Out of Food in the Last Year: Never true    [place] in the Last Year: Never true  Transportation Needs: No Transportation Needs (01/01/2022)   PRAPARE - Hydrologist (Medical): No    Lack of Transportation (Non-Medical): No  Physical Activity: Sufficiently Active (01/01/2022)   Exercise Vital Sign    Days of Exercise per Week: 3 days    Minutes of Exercise per Session: 60 min  Stress: No Stress Concern Present (01/01/2022)   [place]    Feeling of Stress : Not at all  Social Connections: Moderately Integrated (01/01/2022)   Social Connection and Isolation Panel [NHANES]    Frequency of Communication with Friends and Family: More than three times a week    Frequency of Social Gatherings with Friends and Family: Twice a week    Attends Religious Services: More than 4 times per year    Active Member of [employer] or Organizations: No    Attends Archivist Meetings: Never    Marital Status: Married  Human resources officer Violence: Not At Risk (01/01/2022)   Humiliation, Afraid, Rape, and Kick questionnaire    Fear of Current or Ex-Partner: No    Emotionally Abused: No    Physically Abused: No    Sexually Abused: No    Outpatient Medications Prior to Visit  Medication Sig Dispense Refill   atorvastatin (LIPITOR) 80 MG tablet TAKE 1 TABLET DAILY 90 tablet 3   clobetasol cream (TEMOVATE) 6.73 % Apply 1 application topically daily.     clopidogrel (PLAVIX) 75 MG tablet Take 1 tablet (75 mg total) by mouth daily. 90 tablet 1   colchicine 0.6 MG tablet Take 1 tablet (0.6 mg total) by mouth daily as needed (Gout). gout 20 tablet 0   COVID-19 mRNA vaccine 2023-2024 (COMIRNATY) SUSP injection Inject into the muscle. 0.3 mL 0   famotidine (PEPCID) 20 MG tablet TAKE 1 TABLET TWICE A DAY 180 tablet 1   FREESTYLE LITE test strip TEST UP TO FOUR TIMES DAILY 300 strip 3    hydrochlorothiazide (HYDRODIURIL) 25 MG tablet Take 1 tablet (25 mg total) by mouth daily. 90 tablet 1   Lancets (FREESTYLE) lancets TEST UP TO FOUR TIMES DAILY 300 each 3   metFORMIN (GLUCOPHAGE) 1000 MG tablet TAKE 1 TABLET TWICE A DAY WITH MEALS ( THIS SHOULD REPLACE 500 MG TWICE A DAY ) 180 tablet 1   RESTASIS 0.05 % ophthalmic emulsion Place 1  drop into both eyes daily.     RSV vaccine recomb adjuvanted (AREXVY) 120 MCG/0.5ML injection Inject into the muscle. 0.5 mL 0   sacubitril-valsartan (ENTRESTO) 24-26 MG Take 1 tablet by mouth 2 (two) times daily. Needs appointment for future refills 180 tablet 0   Testosterone 1.62 % GEL Place 1 application onto the skin daily.     diltiazem (TIAZAC) 300 MG 24 hr capsule TAKE 1 CAPSULE EVERY MORNING 90 capsule 3   ezetimibe (ZETIA) 10 MG tablet Take 1 tablet (10 mg total) by mouth daily. 90 tablet 3   No facility-administered medications prior to visit.    Allergies  Allergen Reactions   Tanzeum [Albiglutide] Other (See Comments)    constipation    ROS See HPI    Objective:    Physical Exam Constitutional:      General: He is not in acute distress.    Appearance: He is well-developed.  HENT:     Head: Normocephalic and atraumatic.  Cardiovascular:     Rate and Rhythm: Normal rate and regular rhythm.     Heart sounds: No murmur heard. Pulmonary:     Effort: Pulmonary effort is normal. No respiratory distress.     Breath sounds: Normal breath sounds. No wheezing or rales.  Skin:    General: Skin is warm and dry.  Neurological:     Mental Status: He is alert and oriented to person, place, and time.  Psychiatric:        Behavior: Behavior normal.        Thought Content: Thought content normal.     BP (!) 130/100   Pulse 61   Temp 98 F (36.7 C) (Oral)   Resp 16   Wt 244 lb (110.7 kg)   SpO2 100%   BMI 39.38 kg/m  Wt Readings from Last 3 Encounters:  10/08/22 244 lb (110.7 kg)  09/24/22 246 lb (111.6 kg)  08/29/22 251  lb 6.4 oz (114 kg)       Assessment & Plan:   Problem List Items Addressed This Visit       Unprioritized   HTN (hypertension) - Primary    Uncontrolled. Reviewed plan with Dr. Geraldo Pitter- he is not taking diltiazem for rate control. Will d/c diltiazem and switch to amlodipine '5mg'$ .        Relevant Medications   amLODipine (NORVASC) 5 MG tablet   Other Relevant Orders   Basic metabolic panel (Completed)    I have discontinued Mitzi Hansen B. Jubb "Andy"'s diltiazem. I am also having him start on amLODipine. Additionally, I am having him maintain his Testosterone, FREESTYLE LITE, freestyle, colchicine, clobetasol cream, Restasis, ezetimibe, famotidine, atorvastatin, Entresto, clopidogrel, COVID-19 mRNA vaccine 2023-2024, hydrochlorothiazide, metFORMIN, and Arexvy.  Meds ordered this encounter  Medications   amLODipine (NORVASC) 5 MG tablet    Sig: Take 1 tablet (5 mg total) by mouth daily.    Dispense:  30 tablet    Refill:  3    Order Specific Question:   Supervising Provider    Answer:   Penni Homans A [[id number]]

## 2022-10-09 ENCOUNTER — Telehealth: Payer: Self-pay | Admitting: Family

## 2022-10-09 ENCOUNTER — Ambulatory Visit: Payer: Medicare Other

## 2022-10-09 DIAGNOSIS — R2681 Unsteadiness on feet: Secondary | ICD-10-CM

## 2022-10-09 DIAGNOSIS — R296 Repeated falls: Secondary | ICD-10-CM

## 2022-10-09 DIAGNOSIS — M6281 Muscle weakness (generalized): Secondary | ICD-10-CM | POA: Diagnosis not present

## 2022-10-09 MED ORDER — AMLODIPINE BESYLATE 5 MG PO TABS: 5.0000 mg | ORAL_TABLET | Freq: Every day | ORAL | 3 refills | Status: DC

## 2022-10-09 NOTE — Telephone Encounter (Signed)
Please contact pt and let him know that I reviewed his blood pressure medications with Dr. Geraldo Pitter and we decided he should stop diltiazem and start amlodipine '5mg'$  instead which is better for blood pressure control. Follow up with me in 2 weeks for bp recheck.

## 2022-10-09 NOTE — Assessment & Plan Note (Signed)
Uncontrolled. Reviewed plan with Dr. Geraldo Pitter- he is not taking diltiazem for rate control. Will d/c diltiazem and switch to amlodipine '5mg'$ .

## 2022-10-09 NOTE — Therapy (Signed)
OUTPATIENT PHYSICAL THERAPY TREATMENT  Patient Name: Steven Payne MRN: [id number] DOB:[birth year], 76 y.o., male Today's Date: 10/09/2022   PT End of Session - 10/09/22 0845     Visit Number 7    Number of Visits 12    Date for PT Re-Evaluation 10/11/22    Authorization Type Medicare + Tricare    Progress Note Due on Visit 10    PT Start Time 0844    PT Stop Time 0926    PT Time Calculation (min) 42 min    Activity Tolerance Patient tolerated treatment well    Behavior During Therapy [facility] for tasks assessed/performed                Past Medical History:  Diagnosis Date   Arthritis    Ascending aorta dilatation ([place]) 07/19/2021   Atherosclerosis of native coronary artery of native heart with angina pectoris ([place]) 02/23/2016   Coronary artery disease cardiologist-  dr Irish Lack   per cardiac cath 06/ 2013--  no sig. 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   Diabetes mellitus due to underlying condition with unspecified complications ([place]) 62/94/7654   Diabetes type 2, controlled ([place]) 02/23/2016   ED (erectile dysfunction)    Flatulence 09/29/2021   GERD (gastroesophageal reflux disease)    Gout 02/23/2016   History of COVID-19 05/24/2021   History of gout    approx 2002   HTN (hypertension) 02/23/2016   Hyperlipidemia 02/23/2016   Hypogonadism male    Left hydrocele    Morbid obesity ([place]) 07/19/2021   OSA (obstructive sleep apnea) 05/24/2021   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   ??   HYDROCELE EXCISION Left 09/10/2016   Procedure: HYDROCELECTOMY ADULT;  Surgeon: Kathie Rhodes, MD;  Location: [facility];  Service: Urology;  Laterality: Left;   UMBILICAL HERNIA REPAIR  1980's   Patient Active Problem List   Diagnosis Date Noted   History of gout 01/16/2022   Coronary artery disease 08/15/2021   Ascending aorta dilatation ([place]) 07/19/2021   Morbid obesity ([place]) 07/19/2021   Arthritis 07/17/2021   Hypogonadism male 07/17/2021   Left hydrocele 07/17/2021   OSA (obstructive sleep apnea) 05/24/2021   History of COVID-19 05/24/2021   Sweat gland tumor 2021   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   Diabetes mellitus due to underlying condition with unspecified complications ([place]) 65/02/5464   ED (erectile dysfunction) 03/11/2017   Diabetes type 2, controlled ([place]) 02/23/2016   HTN (hypertension) 02/23/2016   Hyperlipidemia 02/23/2016   GERD (gastroesophageal reflux disease) 02/23/2016    PCP: [name], NP  REFERRING PROVIDER: [name], NP  REFERRING DIAG: R26.89 (ICD-10-CM) - Poor balance  THERAPY DIAG:  Repeated falls  Unsteadiness on feet  Muscle weakness (generalized)  Rationale for Evaluation and Treatment Rehabilitation  ONSET DATE: couple of years  SUBJECTIVE:   SUBJECTIVE STATEMENT: Pt reports that standing on one leg is difficult still, dizziness is not as intense.  PERTINENT HISTORY:  HTN, CAD, obesity, T2DM, gout  PAIN:  Are you having pain? No  PRECAUTIONS: Fall  WEIGHT BEARING RESTRICTIONS No  FALLS:  Has patient fallen in last 6 months? Yes. Number of falls at least 10  LIVING ENVIRONMENT: Lives with: lives with their spouse Lives in: House/apartment Stairs: Yes: External: 2 steps; none Has following equipment at home: Single point cane and None  OCCUPATION: retired, former Psychologist, clinical)   PLOF: Independent, goes to gym 4x/week (treadmill, light  lifting), enjoys travel  [place] not fall   OBJECTIVE:   DIAGNOSTIC FINDINGS: no recent imaging  PATIENT SURVEYS:  ABC scale 92.5%  COGNITION:  Overall cognitive status: Within functional limits for tasks assessed     SENSATION: WFL  EDEMA:  NA  MUSCLE LENGTH: NT  POSTURE: rounded shoulders, forward head, and increased thoracic kyphosis  PALPATION: NA  LOWER EXTREMITY MMT:  MMT Right eval Left eval Left 10/09/22  Hip flexion 5 4+ 5  Hip extension 5 4+ 4+  Hip abduction _0 Hip adduction _1 Knee flexion 5 4+ 5  Knee extension _2 Ankle dorsiflexion 5 5   Ankle plantarflexion 5 5   Ankle inversion     Ankle eversion      (Blank rows = not tested)  LOWER EXTREMITY ROM:  NT  CERVICAL ROM:  AROM eval  Cervical Flexion 50  Cervical Extension 60  Cervical Rotation to Left 75  Cervical Rotation to Right 75  Left Sidebend 30  Right Sidebend 40    LOWER EXTREMITY SPECIAL TESTS:  NT   VESTIBULAR ASSESSMENT    SYMPTOM BEHAVIOR:   Subjective history: patient was in artillery, 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  Feels that it has affected his balance.  Now he is getting headaches as well, and then he starts feeling dizzy   Non-Vestibular symptoms: headaches and tinnitus   Type of dizziness: Imbalance (Disequilibrium) and "Swimmyheaded" - feels like waves   Frequency: 1-2x/week   Duration: 30 min to hour   Aggravating factors:  loud noise   Relieving factors: lying supine and advil for headache   Progression of symptoms: worse   OCULOMOTOR EXAM:   Ocular Alignment: normal   Ocular ROM: No Limitations   Spontaneous Nystagmus: absent   Gaze-Induced Nystagmus: age appropriate nystagmus at end range   Smooth Pursuits: saccades   Saccades: hypometric/undershoots with horizontal head movements.      VESTIBULAR - OCULAR REFLEX:    Slow VOR: Normal   VOR Cancellation: Normal   Head-Impulse Test: HIT Right: negative HIT Left:  negative   Dynamic Visual Acuity:  NT    POSITIONAL TESTING: Right Dix-Hallpike: no nystagmus Left Dix-Hallpike: no nystagmus Right Roll Test: geotropic nystagmus Left Roll Test: no nystagmus    MOTION SENSITIVITY:    Motion Sensitivity Quotient  Intensity: 0 = none, 1 = Lightheaded, 2 = Mild, 3 = Moderate, 4 = Severe, 5 = Vomiting  Intensity  1. Sitting to supine   2. Supine to L side   3. Supine to R side   4. Supine to sitting   5. L Hallpike-Dix 0  6. Up from L  0  7. R Hallpike-Dix 0  8. Up from R  0  9. Sitting, head  tipped to L knee 0  10. Head up from L  knee 0  11. Sitting, head  tipped to R knee 0  12. Head up from R  knee 0  13. Sitting head turns x5 0  14.Sitting head nods x5 0  15. In stance, 180  turn to L    16. In stance, 180  turn to R      FUNCTIONAL TESTS:  5 times sit to stand: 6 seconds Timed up and go (TUG): 7 seconds Berg Balance Scale: 54/56 Dynamic Gait Index: 22/24 Functional gait assessment: 21/30 MCTSIB: Condition 1: Avg of 3 trials: 30 sec, Condition 2: Avg of 3 trials: 8 sec, Condition 3: Avg of 3 trials: 30 sec, Condition 4: Avg of 3 trials: 21 sec, and Total Score: 89/120   GAIT: Distance walked: 300' Assistive device utilized: None Level of assistance: Complete Independence Comments: wide BOS, downward gaze   Gait speed 1.04 m/s typical, 1.21 m/s fast speed   TODAY'S TREATMENT: 10/09/22 Neuromuscular Reeducation: to improve balance and stability. SBA for safety throughout.  Nustep L5 x 7 min Cross stepping with colored dots  Head turns on airex x 10 Trunk rotations on airex x 10 Gait with quick reactions, right/left trunk turns, turning around LOBx 1 Braiding 2x15 ft Step ups 8' x 10 bil fwd and lateral  Assessed LE strength  10/04/2022 Neuromuscular Reeducation: to improve balance and stability. SBA for safety throughout.  Nustep L5 x 7 min On airex in corner: Eyes open feet apart x 30 sec  Eyes closed feet  apart x 30 sec Eyes closed feet apart with head nods x 10 Eyes closed feet apart with head turns x 10  Tandem gait - occasional touches on wall for stability L foot forward x 52 sec, R foot forward x 10 sec SLS 2 x 30 sec bil in corner with chair  In hallway: Gait with head turns Gait with head nods Gait with ball catch - both toss and catch by self and from therapist Side stepping with ball catch Step and reach to cones - increasing distance progressively Alternating steps on stool x 10 bil - no difficulty Perturbations all directions - LOB with first push but otherwise no LOB Star excursion pattern - LOB with cross stepping.    09/25/22 Therapeutic Exercise: to improve strength and mobility.  Demo, verbal and tactile cues throughout for technique. Nustep L6x26mn L lateral step ups 8' x 10  Step ups 8' x20 bil Squats at counter x 10  Standing hip abduction, flexion and extension x 10 bil Bird dog x 10  Neuromuscular Reeducation: to improve balance and stability. SBA for safety throughout.  In corner on airex: Eyes open narrow BOS x 30 sec  EO head turns x 10 bil Trunk rotations x 10 bil Tandem walk 6x10 ft  09/20/22 Therapeutic Exercise: to improve strength and mobility.  Demo, verbal and tactile cues throughout for technique. Nustep L6x622m Standing heel raise/toe raise x 20 Church pew x 10 Squats at counter x 10 Standing hip abduction and ext at counter x 10 each  Neuromuscular Reeducation: to improve balance and stability. SBA for safety throughout.  In corner on airex: Eyes open feet apart x 30 sec  EO head turns x 10 bil Tandem stance 2x30 sec Tandem walk 4x 10 ft    PATIENT EDUCATION:  Education details: HEP update 10/12 Person educated: Patient Education method: Explanation, Demonstration, and Handouts Education comprehension: verbalized understanding   HOME EXERCISE PROGRAM: Access Code: [email or web address]  ASSESSMENT:  CLINICAL IMPRESSION: AnJESUS NEVILLScontinues to make good progress in PT, he shows improvement in L LE strength, with exception for hip extension. He continued  to show difficulty with cross stepping with star excursion but mostly going in anterior crossing direction. He showed difficulty with braiding and LOB x 1 with the quick reactions during gait. Min A was required at times with cross stepping and braiding. Plan for wrapping up PT next visit.    OBJECTIVE IMPAIRMENTS decreased balance, decreased strength, impaired perceived functional ability, and postural dysfunction.   ACTIVITY LIMITATIONS bending and locomotion level  PARTICIPATION LIMITATIONS: community activity and yard work  PERSONAL FACTORS 3+ comorbidities: obesity, T2DM, gout, CAD, HTN  are also affecting patient's functional outcome.   REHAB POTENTIAL: Excellent  CLINICAL DECISION MAKING: Evolving/moderate complexity  EVALUATION COMPLEXITY: Moderate   GOALS: Goals reviewed with patient? Yes  SHORT TERM GOALS: Target date: 09/13/2022   Patient will be independent with initial HEP. Baseline: given Goal status: MET 09/13/22 -good compliance  2.  Patient will be educated on strategies to decrease risk of falls.  Baseline: educated today on importance of night lights, tacking down throw rugs, sitting/standing up slowly after supine.  Goal status: MET   LONG TERM GOALS: Target date: 10/11/2022   Patient will be independent with advanced/ongoing HEP to improve outcomes and carryover.  Baseline: needs progression Goal status: IN PROGRESS  3.  Patient will demonstrate improved functional LE strength as demonstrated by 5/5 LLE strength. Baseline: see objective Goal status: IN PROGRESS  4.  Patient will demonstrate 25/30 on FGA to decrease risk of falls.  Baseline: 21/30 Goal status: IN PROGRESS  5.  Patient will score 120/120 on mCTSIB to demonstrate equal weighting of all balance systems.   Baseline: 89/120 Goal status: IN PROGRESS  6.  Patient  will report 100% confidence on ABC scale to demonstrate improved functional ability. Baseline: 92.5% Goal status: IN PROGRESS  7.  Patient will be able to maintain SLS x 15 sec bil to lower risk of falls.   Baseline: <5 sec Goal status: IN PROGRESS    PLAN: PT FREQUENCY: 1-2x/week  PT DURATION: 6 weeks  PLANNED INTERVENTIONS: Therapeutic exercises, Therapeutic activity, Neuromuscular re-education, Balance training, Gait training, Patient/Family education, Self Care, Joint mobilization, Vestibular training, Cryotherapy, Moist heat, Manual therapy, and Re-evaluation  PLAN FOR NEXT SESSION: progress balance training.  Work on cross stepping   [employer], PTA 10/09/2022, 9:27 AM

## 2022-10-09 NOTE — Telephone Encounter (Signed)
Patient advised of change and mychart message sent with the information at his request. He will call in am to set up follow up

## 2022-10-11 ENCOUNTER — Encounter: Payer: Self-pay | Admitting: Physical Therapy

## 2022-10-11 ENCOUNTER — Ambulatory Visit: Payer: Medicare Other | Admitting: Physical Therapy

## 2022-10-11 DIAGNOSIS — R2681 Unsteadiness on feet: Secondary | ICD-10-CM | POA: Diagnosis not present

## 2022-10-11 DIAGNOSIS — R296 Repeated falls: Secondary | ICD-10-CM

## 2022-10-11 DIAGNOSIS — M6281 Muscle weakness (generalized): Secondary | ICD-10-CM

## 2022-10-11 NOTE — Therapy (Signed)
OUTPATIENT PHYSICAL THERAPY TREATMENT  PHYSICAL THERAPY DISCHARGE SUMMARY  Visits from Start of Care: 8  Current functional level related to goals / functional outcomes: Significantly improved balance - able to maintain SLS > 10 sec bil, FGA 28/30 indicating low risk of falls, 94.4% confidence on ABC.  He has fully met LTG #1, 3, and 4 and almost met LTG 2, 5, and 6.     Remaining deficits: 4+/5 hip extensor strength   Education / Equipment: HEP  Plan: Patient agrees to discharge. Patient is being discharged due to meeting the stated rehab goals.       Patient Name: Steven Payne MRN: [id number] DOB:[birth year], 76 y.o., male Today's Date: 10/11/2022   PT End of Session - 10/11/22 0838     Visit Number 8    Number of Visits 12    Date for PT Re-Evaluation 10/11/22    Authorization Type Medicare + Tricare    Progress Note Due on Visit 10    PT Start Time 0841 (P)     PT Stop Time 0919 (P)     PT Time Calculation (min) 38 min (P)     Activity Tolerance Patient tolerated treatment well    Behavior During Therapy [facility] for tasks assessed/performed                Past Medical History:  Diagnosis Date   Arthritis    Ascending aorta dilatation ([place]) 07/19/2021   Atherosclerosis of native coronary artery of native heart with angina pectoris ([place]) 02/23/2016   Coronary artery disease cardiologist-  dr Irish Lack   per cardiac cath 06/ 2013--  no sig. 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   Diabetes mellitus due to underlying condition with unspecified complications ([place]) 68/10/5725   Diabetes type 2, controlled ([place]) 02/23/2016   ED (erectile dysfunction)    Flatulence 09/29/2021   GERD (gastroesophageal reflux disease)    Gout 02/23/2016   History of COVID-19 05/24/2021   History of gout    approx 2002   HTN (hypertension) 02/23/2016   Hyperlipidemia 02/23/2016   Hypogonadism male    Left  hydrocele    Morbid obesity ([place]) 07/19/2021   OSA (obstructive sleep apnea) 05/24/2021   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   ??   HYDROCELE EXCISION Left 09/10/2016   Procedure: HYDROCELECTOMY ADULT;  Surgeon: Kathie Rhodes, MD;  [facility]: [facility];  Service: Urology;  Laterality: Left;   UMBILICAL HERNIA REPAIR  1980's   Patient Active Problem List   Diagnosis Date Noted   History of gout 01/16/2022   Coronary artery disease 08/15/2021   Ascending aorta dilatation ([place]) 07/19/2021   Morbid obesity ([place]) 07/19/2021   Arthritis 07/17/2021   Hypogonadism male 07/17/2021   Left hydrocele 07/17/2021   OSA (obstructive sleep apnea) 05/24/2021   History of COVID-19 05/24/2021   Sweat gland tumor 2021   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   Diabetes mellitus due to underlying condition  with unspecified complications ([place]) 33/00/7622   ED (erectile dysfunction) 03/11/2017   Diabetes type 2, controlled ([place]) 02/23/2016   HTN (hypertension) 02/23/2016   Hyperlipidemia 02/23/2016   GERD (gastroesophageal reflux disease) 02/23/2016    PCP: [name], NP  REFERRING PROVIDER: [name], NP  REFERRING DIAG: R26.89 (ICD-10-CM) - Poor balance  THERAPY DIAG:  Repeated falls  Unsteadiness on feet  Muscle weakness (generalized)  Rationale for Evaluation and Treatment Rehabilitation  ONSET DATE: couple of years  SUBJECTIVE:   SUBJECTIVE STATEMENT: Pt. Reports occasional dizziness and tinnitus.  Headaches have  improved significantly.  Reports no falls, sees a lot of improvement overall.   PERTINENT HISTORY: HTN, CAD, obesity, T2DM, gout  PAIN:  Are you having pain? No  PRECAUTIONS: Fall  WEIGHT BEARING RESTRICTIONS No  FALLS:  Has patient fallen in last 6 months? Yes. Number of falls at least 10  LIVING ENVIRONMENT: Lives with: lives with their spouse Lives in: House/apartment Stairs: Yes: External: 2 steps; none Has following equipment at home: Single point cane and None  OCCUPATION: retired, former Psychologist, clinical)   PLOF: Independent, goes to gym 4x/week (treadmill, light lifting), enjoys travel  [place] not fall   OBJECTIVE:   DIAGNOSTIC FINDINGS: no recent imaging  PATIENT SURVEYS:  ABC scale 92.5%  COGNITION:  Overall cognitive status: Within functional limits for tasks assessed     SENSATION: WFL  EDEMA:  NA  MUSCLE LENGTH: NT  POSTURE: rounded shoulders, forward head, and increased thoracic kyphosis  PALPATION: NA  LOWER EXTREMITY MMT:  MMT Right eval Left eval Left 10/09/22  Hip flexion 5 4+ 5  Hip extension 5 4+ 4+  Hip abduction _0 Hip adduction _1 Knee flexion 5 4+ 5  Knee extension _2 Ankle dorsiflexion 5 5   Ankle plantarflexion 5 5   Ankle inversion     Ankle eversion      (Blank rows = not tested)  LOWER EXTREMITY ROM:  NT  CERVICAL ROM:  AROM eval  Cervical Flexion 50  Cervical Extension 60  Cervical Rotation to Left 75  Cervical Rotation to Right 75  Left Sidebend 30  Right Sidebend 40    LOWER EXTREMITY SPECIAL TESTS:  NT   VESTIBULAR ASSESSMENT    SYMPTOM BEHAVIOR:   Subjective history: patient was in artillery, 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  Feels that it has affected his balance.  Now he is getting headaches as well, and then he starts feeling dizzy   Non-Vestibular symptoms: headaches and tinnitus   Type of dizziness: Imbalance (Disequilibrium) and "Swimmyheaded" - feels  like waves   Frequency: 1-2x/week   Duration: 30 min to hour   Aggravating factors:  loud noise   Relieving factors: lying supine and advil for headache   Progression of symptoms: worse   OCULOMOTOR EXAM:   Ocular Alignment: normal   Ocular ROM: No Limitations   Spontaneous Nystagmus: absent   Gaze-Induced Nystagmus: age appropriate nystagmus at end range   Smooth Pursuits: saccades   Saccades: hypometric/undershoots with horizontal head movements.      VESTIBULAR - OCULAR REFLEX:    Slow VOR: Normal   VOR Cancellation: Normal   Head-Impulse Test: HIT Right: negative HIT Left: negative   Dynamic Visual Acuity:  NT    POSITIONAL TESTING: Right Dix-Hallpike: no nystagmus Left Dix-Hallpike: no nystagmus Right Roll Test: geotropic nystagmus Left Roll Test: no nystagmus    MOTION SENSITIVITY:  Motion Sensitivity Quotient  Intensity: 0 = none, 1 = Lightheaded, 2 = Mild, 3 = Moderate, 4 = Severe, 5 = Vomiting  Intensity  1. Sitting to supine   2. Supine to L side   3. Supine to R side   4. Supine to sitting   5. L Hallpike-Dix 0  6. Up from L  0  7. R Hallpike-Dix 0  8. Up from R  0  9. Sitting, head  tipped to L knee 0  10. Head up from L  knee 0  11. Sitting, head  tipped to R knee 0  12. Head up from R  knee 0  13. Sitting head turns x5 0  14.Sitting head nods x5 0  15. In stance, 180  turn to L    16. In stance, 180  turn to R      FUNCTIONAL TESTS:  5 times sit to stand: 6 seconds Timed up and go (TUG): 7 seconds Berg Balance Scale: 54/56 Dynamic Gait Index: 22/24 Functional gait assessment: 21/30 MCTSIB: Condition 1: Avg of 3 trials: 30 sec, Condition 2: Avg of 3 trials: 8 sec, Condition 3: Avg of 3 trials: 30 sec, Condition 4: Avg of 3 trials: 21 sec, and Total Score: 89/120   GAIT: Distance walked: 300' Assistive device utilized: None Level of assistance: Complete Independence Comments: wide BOS, downward gaze   Gait speed 1.04 m/s typical,  1.21 m/s fast speed   TODAY'S TREATMENT: 10/11/2022  Therapeutic Exercise: to improve strength and mobility.  Demo, verbal and tactile cues throughout for technique. Nustep L6 x 6 min  Squats 2 x 10 with cues for techique,  Star excursion pattern x 10 bil  Review of HEP Therapeutic Activity:  to assess progress towards goals.  SLS FGA ABC scale mCTSIB   10/09/22 Neuromuscular Reeducation: to improve balance and stability. SBA for safety throughout.  Nustep L5 x 7 min Cross stepping with colored dots  Head turns on airex x 10 Trunk rotations on airex x 10 Gait with quick reactions, right/left trunk turns, turning around LOBx 1 Braiding 2x15 ft Step ups 8' x 10 bil fwd and lateral  Assessed LE strength  10/04/2022 Neuromuscular Reeducation: to improve balance and stability. SBA for safety throughout.  Nustep L5 x 7 min On airex in corner: Eyes open feet apart x 30 sec  Eyes closed feet apart x 30 sec Eyes closed feet apart with head nods x 10 Eyes closed feet apart with head turns x 10  Tandem gait - occasional touches on wall for stability L foot forward x 52 sec, R foot forward x 10 sec SLS 2 x 30 sec bil in corner with chair  In hallway: Gait with head turns Gait with head nods Gait with ball catch - both toss and catch by self and from therapist Side stepping with ball catch Step and reach to cones - increasing distance progressively Alternating steps on stool x 10 bil - no difficulty Perturbations all directions - LOB with first push but otherwise no LOB Star excursion pattern - LOB with cross stepping.     PATIENT EDUCATION:  Education details: HEP update 10/12, review on 11/9 Person educated: Patient Education method: Explanation, Demonstration, and Handouts Education comprehension: verbalized understanding   HOME EXERCISE PROGRAM: Access Code: [email or web address]  ASSESSMENT:  CLINICAL IMPRESSION: Steven Payne has made excellent progress with PT demonstrating  significantly improved balance.  Today with 94.4% confidence on ABC scale, only lacking full competence for  standing on chairs and walking on icy side.  He scored 28/30 on FGA indicating low risk for falls.  He completely met long-term goals #1, #3 and #4, and almost met long-term goals #2, #5, and #6.  Today reviewed his exercise program and addressed any questions that he had.  He reports improved confidence overall, and will continue working on exercises daily.  He is ready and appropriate for discharge today.  If he feels that he he is having trouble with his balance again he will ask his primary care provider for referral back to PT.   OBJECTIVE IMPAIRMENTS decreased balance, decreased strength, impaired perceived functional ability, and postural dysfunction.   ACTIVITY LIMITATIONS bending and locomotion level  PARTICIPATION LIMITATIONS: community activity and yard work  PERSONAL FACTORS 3+ comorbidities: obesity, T2DM, gout, CAD, HTN  are also affecting patient's functional outcome.   REHAB POTENTIAL: Excellent  CLINICAL DECISION MAKING: Evolving/moderate complexity  EVALUATION COMPLEXITY: Moderate   GOALS: Goals reviewed with patient? Yes  SHORT TERM GOALS: Target date: 09/13/2022   Patient will be independent with initial HEP. Baseline: given Goal status: MET 09/13/22 -good compliance  2.  Patient will be educated on strategies to decrease risk of falls.  Baseline: educated today on importance of night lights, tacking down throw rugs, sitting/standing up slowly after supine.  Goal status: MET   LONG TERM GOALS: Target date: 10/11/2022   Patient will be independent with advanced/ongoing HEP to improve outcomes and carryover.  Baseline: needs progression Goal status: MET  10/11/2022 very good compliance  2.  Patient will demonstrate improved functional LE strength as demonstrated by 5/5 LLE strength. Baseline: see objective Goal status: IN PROGRESS 10/11/2022- 4+5 bil hip  extensor strength, all other 5/5  3.  Patient will demonstrate 25/30 on FGA to decrease risk of falls.  Baseline: 21/30 Goal status: MET  10/11/2022- 28/30  4.  Patient will score 120/120 on mCTSIB to demonstrate equal weighting of all balance systems.   Baseline: 89/120 Goal status: MET 30 seconds each position 1-4  5.  Patient will report 100% confidence on ABC scale to demonstrate improved functional ability. Baseline: 92.5% Goal status: IN PROGRESS 94.4% - not confident on ladders and icy sidewalks.   6.  Patient will be able to maintain SLS x 15 sec bil to lower risk of falls.   Baseline: <5 sec Goal status: IN PROGRESS R SLS x 15 sec, L SLS x 13 sec    PLAN: PT FREQUENCY: 1-2x/week  PT DURATION: 6 weeks  PLANNED INTERVENTIONS: Therapeutic exercises, Therapeutic activity, Neuromuscular re-education, Balance training, Gait training, Patient/Family education, Self Care, Joint mobilization, Vestibular training, Cryotherapy, Moist heat, Manual therapy, and Re-evaluation  PLAN FOR NEXT SESSION: progress balance training.  Work on cross stepping   Rennie Natter, PT, DPT  10/11/2022, 9:31 AM

## 2022-11-07 ENCOUNTER — Encounter: Payer: Self-pay | Admitting: Family

## 2022-11-07 ENCOUNTER — Ambulatory Visit (INDEPENDENT_AMBULATORY_CARE_PROVIDER_SITE_OTHER): Payer: Medicare Other | Admitting: Family

## 2022-11-07 VITALS — BP 139/73 | HR 67 | Temp 97.9°F | Resp 18 | Ht 66.0 in | Wt 247.1 lb

## 2022-11-07 DIAGNOSIS — E119 Type 2 diabetes mellitus without complications: Secondary | ICD-10-CM | POA: Diagnosis not present

## 2022-11-07 DIAGNOSIS — I10 Essential (primary) hypertension: Secondary | ICD-10-CM | POA: Diagnosis not present

## 2022-11-07 DIAGNOSIS — I25119 Atherosclerotic heart disease of native coronary artery with unspecified angina pectoris: Secondary | ICD-10-CM | POA: Diagnosis not present

## 2022-11-07 LAB — MICROALBUMIN / CREATININE URINE RATIO
Creatinine,U: 38 mg/dL
Microalb Creat Ratio: 1.8 mg/g (ref 0.0–30.0)
Microalb, Ur: <0.7 mg/dL (ref 0.0–1.9)

## 2022-11-07 NOTE — Assessment & Plan Note (Addendum)
BP Readings from Last 3 Encounters:  11/07/22 139/73  10/08/22 (!) 130/100  09/24/22 (!) 151/81   BP is improved with d/c of diltiazem and initiation of amlodipine. Continue same.

## 2022-11-07 NOTE — Assessment & Plan Note (Signed)
Lab Results  Component Value Date   HGBA1C 6.3 08/29/2022   HGBA1C 6.1 04/27/2022   HGBA1C 6.1 01/01/2022   Lab Results  Component Value Date   MICROALBUR 1.1 03/11/2017   LDLCALC 96 03/30/2022   CREATININE 0.77 10/08/2022   Continues metformin. Last A1C at goal. Reinforced weight loss and diet. Urine microalbumin today.

## 2022-11-07 NOTE — Progress Notes (Signed)
Subjective:   By signing my name below, I, Steven Payne, attest that this documentation has been prepared under the direction and in the presence of [name], NP. 11/07/2022   Patient ID: Steven Payne, male    DOB: [birth year], 76 y.o.   MRN: [id number]  Chief Complaint  Patient presents with   Follow-up    HTN- Pt states BPs are doing much better at home, has had diabetic eye exam (Dr. Eulas Post)     HPI Patient is in today for a follow up visit.   Blood pressure: His blood pressure has improved this visit. He continues taking 5 mg amlodipine, 25 mg hydrochlorothiazide daily PO and reports no new issues while taking it. He also reports his blood pressure is readings are stable at home. He denies an swelling in his legs.  BP Readings from Last 3 Encounters:  11/07/22 139/73  10/08/22 (!) 130/100  09/24/22 (!) 151/81   Kidney: His kidney function looked good during his last blood work.   Blood sugar: His A1c was elevated during his last check. He cut out all cane sugar and splenda. He is also planning on cutting out all creamers. He has a family medical history of diabetes.  Lab Results  Component Value Date   HGBA1C 6.3 08/29/2022    Vision: He is UTD on vision care.   Immunizations: He is interested in receiving the RSV vaccine.    Past Medical History:  Diagnosis Date   Arthritis    Ascending aorta dilatation ([place]) 07/19/2021   Atherosclerosis of native coronary artery of native heart with angina pectoris ([place]) 02/23/2016   Coronary artery disease cardiologist-  dr Irish Lack   per cardiac cath 06/ 2013--  no sig. 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   Diabetes mellitus due to underlying condition with unspecified complications ([place]) 67/20/9470   Diabetes type 2, controlled ([place]) 02/23/2016   ED (erectile dysfunction)    Flatulence 09/29/2021   GERD (gastroesophageal reflux disease)     Gout 02/23/2016   History of COVID-19 05/24/2021   History of gout    approx 2002   HTN (hypertension) 02/23/2016   Hyperlipidemia 02/23/2016   Hypogonadism male    Left hydrocele    Morbid obesity ([place]) 07/19/2021   OSA (obstructive sleep apnea) 05/24/2021   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   ??   HYDROCELE EXCISION Left 09/10/2016   Procedure: HYDROCELECTOMY ADULT;  Surgeon: Kathie Rhodes, MD;  Location: [facility];  Service: Urology;  Laterality: Left;   UMBILICAL HERNIA REPAIR  32's    Family History  Problem Relation Age of Onset   Coronary artery disease Father    Diabetes Father    Prostate cancer Father        died at 74   Alcohol abuse Father    Coronary artery disease Mother        died age 68   Alcohol abuse Mother  Cancer Mother        unsure, + thyroid cancer   Breast cancer Paternal Grandmother     Social History   Socioeconomic History   Marital status: Married    Spouse name: Not on file   Number of children: Not on file   Years of education: Not on file   Highest education level: Not on file  Occupational History   Not on file  Tobacco Use   Smoking status: Former    Years: 2.00    Types: Cigarettes    Quit date: 03/27/1991    Years since quitting: 31.6   Smokeless tobacco: Never   Tobacco comments:    light smoker (smoked on 2 cigarettes per day for almost 2 years)  Vaping Use   Vaping Use: Never used  Substance and Sexual Activity   Alcohol use: No    Alcohol/week: 0.0 standard drinks  of alcohol   Drug use: No   Sexual activity: Not on file  Other Topics Concern   Not on file  Social History Narrative   Retired Corporate treasurer, 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  No grandchildren.  Oldest in [place], youngest in [place]   Completed bachelors, some graduate (Estate manager/land agent)   Enjoys travelling, walking, cruises   Social Determinants of Health   Financial Resource Strain: [place]  (01/01/2022)   Overall Financial Resource Strain (CARDIA)    Difficulty of Paying Living Expenses: Not hard at all  Food Insecurity: No Food Insecurity (01/01/2022)   Hunger Vital Sign    Worried About Running Out of Food in the Last Year: Never true    [place] in the Last Year: Never true  Transportation Needs: No Transportation Needs (01/01/2022)   PRAPARE - Hydrologist (Medical): No    Lack of Transportation (Non-Medical): No  Physical Activity: Sufficiently Active (01/01/2022)   Exercise Vital Sign    Days of Exercise per Week: 3 days    Minutes of Exercise per Session: 60 min  Stress: No Stress Concern Present (01/01/2022)   [place]    Feeling of Stress : Not at all  Social Connections: Moderately Integrated (01/01/2022)   Social Connection and Isolation Panel [NHANES]    Frequency of Communication with Friends and Family: More than three times a week    Frequency of Social Gatherings with Friends and Family: Twice a week    Attends Religious Services: More than 4 times per year    Active Member of [employer] or Organizations: No    Attends Archivist Meetings: Never    Marital Status: Married  Human resources officer Violence: Not At Risk (01/01/2022)   Humiliation, Afraid, Rape, and Kick questionnaire    Fear of Current or Ex-Partner: No    Emotionally Abused: No    Physically Abused: No     Sexually Abused: No    Outpatient Medications Prior to Visit  Medication Sig Dispense Refill   amLODipine (NORVASC) 5 MG tablet Take 1 tablet (5 mg total) by mouth daily. 30 tablet 3   atorvastatin (LIPITOR) 80 MG tablet TAKE 1 TABLET DAILY 90 tablet 3   clobetasol cream (TEMOVATE) 7.65 % Apply 1 application topically daily.     clopidogrel (PLAVIX) 75 MG tablet Take 1 tablet (75 mg total) by mouth daily. [place]  tablet 1   colchicine 0.6 MG tablet Take 1 tablet (0.6 mg total) by mouth daily as needed (Gout). gout 20 tablet 0   famotidine (PEPCID) 20 MG tablet TAKE 1 TABLET TWICE A DAY 180 tablet 1   hydrochlorothiazide (HYDRODIURIL) 25 MG tablet Take 1 tablet (25 mg total) by mouth daily. 90 tablet 1   Lancets (FREESTYLE) lancets TEST UP TO FOUR TIMES DAILY 300 each 3   metFORMIN (GLUCOPHAGE) 1000 MG tablet TAKE 1 TABLET TWICE A DAY WITH MEALS ( THIS SHOULD REPLACE 500 MG TWICE A DAY ) 180 tablet 1   RESTASIS 0.05 % ophthalmic emulsion Place 1 drop into both eyes daily.     sacubitril-valsartan (ENTRESTO) 24-26 MG Take 1 tablet by mouth 2 (two) times daily. Needs appointment for future refills 180 tablet 0   Testosterone 1.62 % GEL Place 1 application onto the skin daily.     ezetimibe (ZETIA) 10 MG tablet Take 1 tablet (10 mg total) by mouth daily. 90 tablet 3   FREESTYLE LITE test strip TEST UP TO FOUR TIMES DAILY (Patient not taking: Reported on 11/07/2022) 300 strip 3   COVID-19 mRNA vaccine 2023-2024 (COMIRNATY) SUSP injection Inject into the muscle. 0.3 mL 0   RSV vaccine recomb adjuvanted (AREXVY) 120 MCG/0.5ML injection Inject into the muscle. 0.5 mL 0   No facility-administered medications prior to visit.    Allergies  Allergen Reactions   Tanzeum [Albiglutide] Other (See Comments)    constipation    Review of Systems  Cardiovascular:  Negative for leg swelling.       Objective:    Physical Exam Constitutional:      General: He is not in acute distress.    Appearance:  Normal appearance. He is not ill-appearing.  HENT:     Head: Normocephalic and atraumatic.     Right Ear: External ear normal.     Left Ear: External ear normal.  Eyes:     Extraocular Movements: Extraocular movements intact.     Pupils: Pupils are equal, round, and reactive to light.  Cardiovascular:     Rate and Rhythm: Normal rate and regular rhythm.     Heart sounds: Normal heart sounds. No murmur heard.    No gallop.  Pulmonary:     Effort: Pulmonary effort is normal. No respiratory distress.     Breath sounds: Normal breath sounds. No wheezing or rales.  Skin:    General: Skin is warm and dry.  Neurological:     Mental Status: He is alert and oriented to person, place, and time.  Psychiatric:        Judgment: Judgment normal.     BP 139/73   Pulse 67   Temp 97.9 F (36.6 C) (Oral)   Resp 18   Ht '5\' 6"'$  (1.676 m)   Wt 247 lb 2 oz (112.1 kg)   SpO2 96%   BMI 39.89 kg/m  Wt Readings from Last 3 Encounters:  11/07/22 247 lb 2 oz (112.1 kg)  10/08/22 244 lb (110.7 kg)  09/24/22 246 lb (111.6 kg)   Assessment & Plan:  Controlled type 2 diabetes mellitus without complication, without long-term current use of insulin (HCC) Assessment & Plan: Lab Results  Component Value Date   HGBA1C 6.3 08/29/2022   HGBA1C 6.1 04/27/2022   HGBA1C 6.1 01/01/2022   Lab Results  Component Value Date   MICROALBUR 1.1 03/11/2017   [place] 96 03/30/2022   CREATININE 0.77 10/08/2022   Continues metformin. Last  A1C at goal. Reinforced weight loss and diet. Urine microalbumin today.     Orders: -     Microalbumin / creatinine urine ratio  Primary hypertension Assessment & Plan: BP Readings from Last 3 Encounters:  11/07/22 139/73  10/08/22 (!) 130/100  09/24/22 (!) 151/81   BP is improved with d/c of diltiazem and initiation of amlodipine. Continue same.     No orders of the defined types were placed in this encounter.   I, Nance Pear, NP, personally  preformed the services described in this documentation.  All medical record entries made by the scribe were at my direction and in my presence.  I have reviewed the chart and discharge instructions (if applicable) and agree that the record reflects my personal performance and is accurate and complete. 11/07/2022   I,Steven Payne,acting as a Education administrator for Nance Pear, NP.,have documented all relevant documentation on the behalf of Nance Pear, NP,as directed by  Nance Pear, NP while in the presence of Nance Pear, NP.   Nance Pear, NP

## 2022-11-14 ENCOUNTER — Ambulatory Visit: Payer: Medicare Other | Admitting: Family

## 2022-12-05 ENCOUNTER — Ambulatory Visit: Payer: Medicare Other | Admitting: Family

## 2022-12-13 ENCOUNTER — Other Ambulatory Visit: Payer: Self-pay | Admitting: *Deleted

## 2022-12-13 MED ORDER — AMLODIPINE BESYLATE 5 MG PO TABS: 5.0000 mg | ORAL_TABLET | Freq: Every day | ORAL | 1 refills | Status: DC

## 2022-12-14 ENCOUNTER — Other Ambulatory Visit: Payer: Self-pay

## 2022-12-14 MED ORDER — ENTRESTO 24-26 MG PO TABS: 1.0000 | ORAL_TABLET | Freq: Two times a day (BID) | ORAL | 0 refills | Status: DC

## 2022-12-21 ENCOUNTER — Telehealth: Payer: Self-pay | Admitting: Family

## 2022-12-21 NOTE — Telephone Encounter (Signed)
Spoke with patient he was working and [employer] to [employer].  Avail date and time 2/1 and 2/2 at 8:20am,

## 2022-12-22 ENCOUNTER — Emergency Department (HOSPITAL_BASED_OUTPATIENT_CLINIC_OR_DEPARTMENT_OTHER)
Admission: EM | Admit: 2022-12-22 | Discharge: 2022-12-22 | Disposition: A | Discharge status: Home / Self Care | Payer: Medicare Other | Attending: Emergency Medicine | Admitting: Emergency Medicine

## 2022-12-22 ENCOUNTER — Other Ambulatory Visit: Payer: Self-pay

## 2022-12-22 DIAGNOSIS — E119 Type 2 diabetes mellitus without complications: Secondary | ICD-10-CM | POA: Diagnosis not present

## 2022-12-22 DIAGNOSIS — R059 Cough, unspecified: Secondary | ICD-10-CM | POA: Diagnosis present

## 2022-12-22 DIAGNOSIS — Z79899 Other long term (current) drug therapy: Secondary | ICD-10-CM | POA: Diagnosis not present

## 2022-12-22 DIAGNOSIS — Z7901 Long term (current) use of anticoagulants: Secondary | ICD-10-CM | POA: Diagnosis not present

## 2022-12-22 DIAGNOSIS — I10 Essential (primary) hypertension: Secondary | ICD-10-CM | POA: Insufficient documentation

## 2022-12-22 DIAGNOSIS — Z8616 Personal history of COVID-19: Secondary | ICD-10-CM | POA: Diagnosis not present

## 2022-12-22 DIAGNOSIS — U071 COVID-19: Secondary | ICD-10-CM | POA: Diagnosis not present

## 2022-12-22 DIAGNOSIS — Z8679 Personal history of other diseases of the circulatory system: Secondary | ICD-10-CM | POA: Diagnosis not present

## 2022-12-22 DIAGNOSIS — Z7984 Long term (current) use of oral hypoglycemic drugs: Secondary | ICD-10-CM | POA: Diagnosis not present

## 2022-12-22 LAB — RESP PANEL BY RT-PCR (RSV, FLU A&B, COVID)  RVPGX2
Influenza A by PCR: NEGATIVE
Influenza B by PCR: NEGATIVE
Resp Syncytial Virus by PCR: NEGATIVE
SARS Coronavirus 2 by RT PCR: POSITIVE — AB

## 2022-12-22 MED ORDER — MOLNUPIRAVIR EUA 200MG CAPSULE: 4.0000 | ORAL_CAPSULE | Freq: Two times a day (BID) | ORAL | 0 refills | Status: AC

## 2022-12-22 NOTE — ED Notes (Signed)
Collected repeat nasal swab for lab

## 2022-12-22 NOTE — ED Triage Notes (Signed)
Pt reports cough, runny nose, and headache for the past 2 days. Has not noticed any fever. No n/v/d.

## 2022-12-22 NOTE — ED Provider Notes (Signed)
[facility] Provider Note   CSN: [id number] Arrival date & time: 12/22/22  2119     History  Chief Complaint  Patient presents with   Cough    Steven Payne is a 77 y.o. male with T2DM, HTN, HLD, GERD, ED, OSA, ascending aorta dilatation, morbid obesity, CAD, gout who presents with cough.   Patient presents with cough, runny nose, nasal congestion, mild sore throat, and bilateral frontal headache moderate in severity for the past 2 days.  No known sick contacts.  has not noticed any fever/chills. No visual changes, numbness tingling, asymmetric weakness, chest pain, shortness of breath, abdominal pain, n/v/d, lower extremity edema.  Has only tried Coricidin at home for his symptoms.  Came to ED because his cough kept him up last night.   Cough      Home Medications Prior to Admission medications   Medication Sig Start Date End Date Taking? Authorizing Provider  molnupiravir EUA (LAGEVRIO) 200 mg CAPS capsule Take 4 capsules (800 mg total) by mouth 2 (two) times daily for 5 days. 12/22/22 12/27/22 Yes Audley Hose, MD  amLODipine (NORVASC) 5 MG tablet Take 1 tablet (5 mg total) by mouth daily. 12/13/22   [name], NP  atorvastatin (LIPITOR) 80 MG tablet TAKE 1 TABLET DAILY 07/09/22   Revankar, Reita Cliche, MD  clobetasol cream (TEMOVATE) 4.17 % Apply 1 application topically daily. 11/29/21   [provider]  clopidogrel (PLAVIX) 75 MG tablet Take 1 tablet (75 mg total) by mouth daily. 08/29/22   [name], NP  colchicine 0.6 MG tablet Take 1 tablet (0.6 mg total) by mouth daily as needed (Gout). gout 09/29/21   [name], NP  ezetimibe (ZETIA) 10 MG tablet Take 1 tablet (10 mg total) by mouth daily. 01/23/22 04/23/22  Revankar, Reita Cliche, MD  famotidine (PEPCID) 20 MG tablet TAKE 1 TABLET TWICE A DAY 01/26/22   [name], NP  FREESTYLE LITE test strip TEST UP TO FOUR TIMES DAILY Patient not  taking: Reported on 11/07/2022 10/03/20   [name], NP  hydrochlorothiazide (HYDRODIURIL) 25 MG tablet Take 1 tablet (25 mg total) by mouth daily. 09/24/22   [name], NP  Lancets (FREESTYLE) lancets TEST UP TO FOUR TIMES DAILY 10/03/20   [name], NP  metFORMIN (GLUCOPHAGE) 1000 MG tablet TAKE 1 TABLET TWICE A DAY WITH MEALS ( THIS SHOULD REPLACE 500 MG TWICE A DAY ) 09/24/22   [name], NP  RESTASIS 0.05 % ophthalmic emulsion Place 1 drop into both eyes daily. 11/29/21   [provider]  sacubitril-valsartan (ENTRESTO) 24-26 MG Take 1 tablet by mouth 2 (two) times daily. Needs appointment for future refills 12/14/22   Revankar, Reita Cliche, MD  Testosterone 1.62 % GEL Place 1 application onto the skin daily.    [provider]      Allergies    Tanzeum [albiglutide]    Review of Systems   Review of Systems  Respiratory:  Positive for cough.    Review of systems Negative for f/c.  A 10 point review of systems was performed and is negative unless otherwise reported in HPI.  Physical Exam Updated Vital Signs BP (!) 154/92   Pulse 75   Temp 98.3 F (36.8 C) (Oral)   Resp 16   SpO2 95%  Physical Exam General: Normal appearing male, lying in bed.  HEENT: PERRLA, EOMI, Sclera anicteric, MMM, trachea midline. Clear oropharynx without any erythema, tonsillar exudates, posterior  oropharyngeal swelling/edema. Uvula midline.   Cardiology: RRR, no murmurs/rubs/gallops. BL radial and DP pulses equal bilaterally.  Resp: Normal respiratory rate and effort. CTAB, no wheezes, rhonchi, crackles.  Abd: Soft, non-tender, non-distended. No rebound tenderness or guarding.  GU: Deferred. MSK: No peripheral edema or signs of trauma. Extremities without deformity or TTP. No cyanosis or clubbing. Skin: warm, dry. No rashes or lesions. Neuro: A&Ox4, CNs II-XII grossly intact. Normal gait. MAEs. Sensation grossly intact.  Psych: Normal mood and affect.    ED Results / Procedures / Treatments   Labs (all labs ordered are listed, but only abnormal results are displayed) Labs Reviewed  RESP PANEL BY RT-PCR (RSV, FLU A&B, COVID)  [id number] - Abnormal; Notable for the following components:      Result Value   SARS Coronavirus 2 by RT PCR POSITIVE (*)    All other components within normal limits    EKG None  Radiology No results found.  Procedures Procedures    Medications Ordered in ED Medications - No data to display  ED Course/ Medical Decision Making/ A&P                          Medical Decision Making Amount and/or Complexity of Data Reviewed Labs:  Decision-making details documented in ED Course.    MDM:    Patient presents with flulike illness associated with headache.  Patient's headache is not associated with any focal neurodeficits and is moderate in severity will treat with Tylenol and reassess.  Consider upper respiratory viral infection such as COVID flu RSV and will obtain a swab.  For patient's cough in association with his viral symptoms this is most likely due to URI, less likely bronchitis or pneumonia.  Patient is CTAB without any fevers chills and his symptoms only began 2 days ago, he is not short of breath at all, do not believe a chest x-ray is necessary at this time and after shared decision making with patient, he is in agreement.  No wheezing to suggest obstructive lung disease.  Patient is afebrile and overall very well-appearing, very low concern for serious bacterial infection.  Clinical Course as of 12/22/22 1120  Sat Dec 22, 2022  4332 Lab having to run covid/flu/rsv swab twice [HN]  1116 SARS Coronavirus 2 by RT PCR(!): POSITIVE [HN]  1116 Pt is covid positive. Tylenol improved his headache. Patient is already taking Coricidin which contains dextromethorphan for his cough.  I also recommended adding guaifenesin and tea with honey.  For patient's headache I advised Tylenol 1000 mg every 8 hours. No  hypoxia/SOB, increased WOB. Will treat with molnupiravir and DC w/ DC instructions/return precautions. [HN]    Clinical Course User Index [HN] Audley Hose, MD    Labs: I Ordered, and personally interpreted labs.  The pertinent results include:  covid+  Reevaluation: After the interventions noted above, I reevaluated the patient and found that they have :improved  Social Determinants of Health: Patient lives independently   Disposition: DC  Co morbidities that complicate the patient evaluation  Past Medical History:  Diagnosis Date   Arthritis    Ascending aorta dilatation ([place]) 07/19/2021   Atherosclerosis of native coronary artery of native heart with angina pectoris ([place]) 02/23/2016   Coronary artery disease cardiologist-  dr Irish Lack   per cardiac cath 06/ 2013--  no sig. 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  Diabetes mellitus due to underlying condition with unspecified complications ([place]) 23/53/6144   Diabetes type 2, controlled ([place]) 02/23/2016   ED (erectile dysfunction)    Flatulence 09/29/2021   GERD (gastroesophageal reflux disease)    Gout 02/23/2016   History of COVID-19 05/24/2021   History of gout    approx 2002   HTN (hypertension) 02/23/2016   Hyperlipidemia 02/23/2016   Hypogonadism male    Left hydrocele    Morbid obesity ([place]) 07/19/2021   OSA (obstructive sleep apnea) 05/24/2021   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    Dispense:  40 capsule    Refill:  0    I have reviewed the patients home medicines and have made adjustments as  needed  Problem List / ED Course: Problem List Items Addressed This Visit   None Visit Diagnoses     COVID-19    -  Primary   Relevant Medications   molnupiravir EUA (LAGEVRIO) 200 mg CAPS capsule                   This note was created using dictation software, which may contain spelling or grammatical errors.    Audley Hose, MD 12/22/22 1120

## 2022-12-22 NOTE — Discharge Instructions (Addendum)
Thank you for coming to [facility] Emergency Department. You were seen for flulike symptoms. We did an exam, labs, and these showed positive covid-19 infection.  We will treat with antiviral medication called molnupiravir which can help shorten and decrease the severity of your COVID infection.  For your cough and congestion, you can take over-the-counter Mucinex (guaifenesin) or the Coricidin (which contains dextromethorphan). Please also utilize tea with honey. You can take tylenol 1,000 mg every 8 hours for headache or fever.   Please follow up with your primary care provider if your symptoms do not improve in 1 week.   Do not hesitate to return to the ED or call 911 if you experience: -Worsening symptoms -Chest pain, shortness of breath -Lightheadedness, passing out -Fevers/chills -Anything else that concerns you

## 2023-01-01 DIAGNOSIS — H9313 Tinnitus, bilateral: Secondary | ICD-10-CM | POA: Diagnosis not present

## 2023-01-01 DIAGNOSIS — H903 Sensorineural hearing loss, bilateral: Secondary | ICD-10-CM | POA: Diagnosis not present

## 2023-01-01 HISTORY — DX: Sensorineural hearing loss, bilateral: H90.3

## 2023-01-01 HISTORY — DX: Tinnitus, bilateral: H93.13

## 2023-01-02 ENCOUNTER — Other Ambulatory Visit: Payer: Self-pay | Admitting: Physician Assistant

## 2023-01-02 DIAGNOSIS — H903 Sensorineural hearing loss, bilateral: Secondary | ICD-10-CM

## 2023-01-23 ENCOUNTER — Telehealth: Payer: Self-pay | Admitting: Family

## 2023-01-23 NOTE — Telephone Encounter (Signed)
Contacted [name] to schedule their annual wellness visit. Appointment made for 02/04/2023.  [facility] Direct Dial: [phone]

## 2023-01-28 ENCOUNTER — Ambulatory Visit
Admission: RE | Admit: 2023-01-28 | Discharge: 2023-01-28 | Disposition: A | Discharge status: Home / Self Care | Payer: Medicare Other | Source: Ambulatory Visit | Attending: Physician Assistant | Admitting: Physician Assistant

## 2023-01-28 DIAGNOSIS — H903 Sensorineural hearing loss, bilateral: Secondary | ICD-10-CM

## 2023-01-28 DIAGNOSIS — I6782 Cerebral ischemia: Secondary | ICD-10-CM | POA: Diagnosis not present

## 2023-01-28 MED ORDER — GADOPICLENOL 0.5 MMOL/ML IV SOLN
10.0000 mL | Freq: Once | INTRAVENOUS | Status: AC | PRN
  Administered 2023-01-28: 10 mL via INTRAVENOUS

## 2023-01-30 ENCOUNTER — Telehealth: Payer: Self-pay | Admitting: Family

## 2023-01-30 NOTE — Telephone Encounter (Signed)
Copied from [place] [id number]. Topic: Medicare AWV >> Jan 30, 2023  9:51 AM Devoria Glassing wrote: Reason for CRM: Called patient to reschedule Medicare Annual Wellness Visit (AWV). Left message for patient to call back and reschedule Medicare Annual Wellness Visit (AWV).  Last date of AWV: 01/01/2022   Please schedule an appointment at any time with [facility].  If any questions, please contact me.  Thank you ,  Sherol Dade; [facility] Direct Dial: [phone]

## 2023-02-06 ENCOUNTER — Ambulatory Visit (INDEPENDENT_AMBULATORY_CARE_PROVIDER_SITE_OTHER): Payer: Medicare Other | Admitting: Family

## 2023-02-06 VITALS — BP 128/76 | HR 65 | Temp 98.0°F | Resp 18 | Ht 66.0 in | Wt 249.0 lb

## 2023-02-06 DIAGNOSIS — I2510 Atherosclerotic heart disease of native coronary artery without angina pectoris: Secondary | ICD-10-CM | POA: Diagnosis not present

## 2023-02-06 DIAGNOSIS — H8113 Benign paroxysmal vertigo, bilateral: Secondary | ICD-10-CM | POA: Insufficient documentation

## 2023-02-06 DIAGNOSIS — E291 Testicular hypofunction: Secondary | ICD-10-CM | POA: Diagnosis not present

## 2023-02-06 DIAGNOSIS — I10 Essential (primary) hypertension: Secondary | ICD-10-CM | POA: Diagnosis not present

## 2023-02-06 DIAGNOSIS — E119 Type 2 diabetes mellitus without complications: Secondary | ICD-10-CM

## 2023-02-06 HISTORY — DX: Benign paroxysmal vertigo, bilateral: H81.13

## 2023-02-06 LAB — COMPREHENSIVE METABOLIC PANEL
ALT: 5 U/L (ref 0–53)
AST: 13 U/L (ref 0–37)
Albumin: 4 g/dL (ref 3.5–5.2)
Alkaline Phosphatase: 72 U/L (ref 39–117)
BUN: 15 mg/dL (ref 6–23)
CO2: 28 mEq/L (ref 19–32)
Calcium: 9.7 mg/dL (ref 8.4–10.5)
Chloride: 102 mEq/L (ref 96–112)
Creatinine, Ser: 0.74 mg/dL (ref 0.40–1.50)
GFR: 86.93 mL/min (ref >60.00)
Glucose, Bld: 86 mg/dL (ref 70–99)
Potassium: 4 mEq/L (ref 3.5–5.1)
Sodium: 141 mEq/L (ref 135–145)
Total Bilirubin: 0.9 mg/dL (ref 0.2–1.2)
Total Protein: 6.9 g/dL (ref 6.0–8.3)

## 2023-02-06 LAB — LIPID PANEL
Cholesterol: 133 mg/dL (ref 0–200)
HDL: 46 mg/dL (ref >39.00)
LDL Cholesterol: 69 mg/dL (ref 0–99)
NonHDL: 86.57
Total CHOL/HDL Ratio: 3
Triglycerides: 89 mg/dL (ref 0.0–149.0)
VLDL: 17.8 mg/dL (ref 0.0–40.0)

## 2023-02-06 LAB — HEMOGLOBIN A1C: Hgb A1c MFr Bld: 6.2 % (ref 4.6–6.5)

## 2023-02-06 NOTE — Assessment & Plan Note (Signed)
Last A1c was at goal. Continue metformin.

## 2023-02-06 NOTE — Progress Notes (Signed)
Subjective:     Patient ID: Steven Payne, male    DOB: [birth year], 77 y.o.   MRN: [id number]  Chief Complaint  Patient presents with   Follow-up    3 month   Shoulder Injury    Right -- fall onset: 3 days    HPI Patient is in today for follow up.  DM2- maintained on metformin.   Lab Results  Component Value Date   HGBA1C 6.3 08/29/2022   HGBA1C 6.1 04/27/2022   HGBA1C 6.1 01/01/2022   Lab Results  Component Value Date   MICROALBUR <0.7 11/07/2022   LDLCALC 96 03/30/2022   CREATININE 0.77 10/08/2022   GERD- maintained on pepcid. Uses most days.    Hyperlipidemia- on zetia and atorvastatin.  Lab Results  Component Value Date   CHOL 158 03/30/2022   HDL 44 03/30/2022   LDLCALC 96 03/30/2022   TRIG 97 03/30/2022   CHOLHDL 3.6 03/30/2022   HTN- Reports home blood pressure readings have been very good.  BP Readings from Last 3 Encounters:  02/06/23 128/76  12/22/22 (!) 146/84  11/07/22 139/73   On amlodipine, hctz (and entresto for CHF).   Fall- notes that he was at the gym, had just stepped off of the treadmill and got dizzy. [place] and hit his right shoulder.  Denies head injury.  Shoulder is only a little sore.   Gout- no recent flares.    He did see audiology and was told that he has some hearing loss.  Health Maintenance Due  Topic Date Due   COVID-19 Vaccine (7 - 2023-24 season) 11/08/2022   Medicare Annual Wellness (AWV)  01/01/2023    Past Medical History:  Diagnosis Date   Arthritis    Ascending aorta dilatation ([place]) 07/19/2021   Atherosclerosis of native coronary artery of native heart with angina pectoris ([place]) 02/23/2016   Coronary artery disease cardiologist-  dr Irish Lack   per cardiac cath 06/ 2013--  no sig. 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   Diabetes mellitus due to underlying condition with unspecified complications ([place]) [id number]   Diabetes type 2,  controlled ([place]) 02/23/2016   ED (erectile dysfunction)    Flatulence 09/29/2021   GERD (gastroesophageal reflux disease)    Gout 02/23/2016   History of COVID-19 05/24/2021   History of gout    approx 2002   HTN (hypertension) 02/23/2016   Hyperlipidemia 02/23/2016   Hypogonadism male    Left hydrocele    Morbid obesity ([place]) 07/19/2021   OSA (obstructive sleep apnea) 05/24/2021   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   ??   HYDROCELE EXCISION Left 09/10/2016   Procedure: HYDROCELECTOMY ADULT;  Surgeon: Kathie Rhodes, MD;  Location: [facility];  Service: Urology;  Laterality: Left;   UMBILICAL HERNIA REPAIR  73's    Family History  Problem Relation Age of Onset   Coronary artery disease Father    Diabetes Father    Prostate cancer  Father        died at 62   Alcohol abuse Father    Coronary artery disease Mother        died age 66   Alcohol abuse Mother    Cancer Mother        unsure, + thyroid cancer   Breast cancer Paternal Grandmother     Social History   Socioeconomic History   Marital status: Married    Spouse name: Not on file   Number of children: Not on file   Years of education: Not on file   Highest education level: Not on file  Occupational History   Not on file  Tobacco Use   Smoking status: Former    Years: 2.00    Types: Cigarettes    Quit date: 03/27/1991    Years since quitting: 31.8   Smokeless tobacco: Never   Tobacco comments:    light smoker (smoked on 2 cigarettes per day for almost 2 years)   Vaping Use   Vaping Use: Never used  Substance and Sexual Activity   Alcohol use: No    Alcohol/week: 0.0 standard drinks of alcohol   Drug use: No   Sexual activity: Not on file  Other Topics Concern   Not on file  Social History Narrative   Retired Corporate treasurer, 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  No grandchildren.  Oldest in [place], youngest in [place]   Completed bachelors, some graduate (Estate manager/land agent)   Enjoys travelling, walking, cruises   Social Determinants of Health   Financial Resource Strain: [place]  (01/01/2022)   Overall Financial Resource Strain (CARDIA)    Difficulty of Paying Living Expenses: Not hard at all  Food Insecurity: No Food Insecurity (01/01/2022)   Hunger Vital Sign    Worried About Running Out of Food in the Last Year: Never true    [place] in the Last Year: Never true  Transportation Needs: No Transportation Needs (01/01/2022)   PRAPARE - Hydrologist (Medical): No    Lack of Transportation (Non-Medical): No  Physical Activity: Sufficiently Active (01/01/2022)   Exercise Vital Sign    Days of Exercise per Week: 3 days    Minutes of Exercise per Session: 60 min  Stress: No Stress Concern Present (01/01/2022)   [place]    Feeling of Stress : Not at all  Social Connections: Moderately Integrated (01/01/2022)   Social Connection and Isolation Panel [NHANES]    Frequency of Communication with Friends and Family: More than three times a week    Frequency of Social Gatherings with Friends and Family: Twice a week    Attends Religious Services: More than 4 times per year    Active Member of [employer] or Organizations: No    Attends Archivist Meetings: Never    Marital Status: Married  Human resources officer Violence: Not At Risk (01/01/2022)   Humiliation,  Afraid, Rape, and Kick questionnaire    Fear of Current or Ex-Partner: No    Emotionally Abused: No    Physically Abused: No    Sexually Abused: No    Outpatient Medications Prior to Visit  Medication Sig Dispense Refill   amLODipine (NORVASC) 5 MG tablet Take 1 tablet (5 mg total) by mouth daily. 90 tablet 1   atorvastatin (LIPITOR)  80 MG tablet TAKE 1 TABLET DAILY 90 tablet 3   clobetasol cream (TEMOVATE) [id number] % Apply 1 application topically daily.     clopidogrel (PLAVIX) 75 MG tablet Take 1 tablet (75 mg total) by mouth daily. 90 tablet 1   colchicine 0.6 MG tablet Take 1 tablet (0.6 mg total) by mouth daily as needed (Gout). gout 20 tablet 0   ezetimibe (ZETIA) 10 MG tablet Take 1 tablet (10 mg total) by mouth daily. 90 tablet 3   famotidine (PEPCID) 20 MG tablet TAKE 1 TABLET TWICE A DAY 180 tablet 1   hydrochlorothiazide (HYDRODIURIL) 25 MG tablet Take 1 tablet (25 mg total) by mouth daily. 90 tablet 1   Lancets (FREESTYLE) lancets TEST UP TO FOUR TIMES DAILY 300 each 3   metFORMIN (GLUCOPHAGE) 1000 MG tablet TAKE 1 TABLET TWICE A DAY WITH MEALS ( THIS SHOULD REPLACE 500 MG TWICE A DAY ) 180 tablet 1   RESTASIS 0.05 % ophthalmic emulsion Place 1 drop into both eyes daily.     sacubitril-valsartan (ENTRESTO) 24-26 MG Take 1 tablet by mouth 2 (two) times daily. Needs appointment for future refills 60 tablet 0   Testosterone 1.62 % GEL Place 1 application onto the skin daily.     FREESTYLE LITE test strip TEST UP TO FOUR TIMES DAILY (Patient not taking: Reported on 11/07/2022) 300 strip 3   No facility-administered medications prior to visit.    Allergies  Allergen Reactions   Tanzeum [Albiglutide] Other (See Comments)    constipation    ROS    See HPI Objective:    Physical Exam Constitutional:      General: He is not in acute distress.    Appearance: He is well-developed.  HENT:     Head: Normocephalic and atraumatic.  Cardiovascular:     Rate and Rhythm: Normal rate  and regular rhythm.     Heart sounds: No murmur heard. Pulmonary:     Effort: Pulmonary effort is normal. No respiratory distress.     Breath sounds: Normal breath sounds. No wheezing or rales.  Musculoskeletal:     Comments: No right shoulder tenderness to palpation.   Skin:    General: Skin is warm and dry.  Neurological:     Mental Status: He is alert and oriented to person, place, and time.     Comments: + [place] bilaterally  Psychiatric:        Behavior: Behavior normal.        Thought Content: Thought content normal.     BP 128/76   Pulse 65   Temp 98 F (36.7 C)   Resp 18   Ht '5\' 6"'$  (1.676 m)   Wt 249 lb (112.9 kg)   SpO2 100%   BMI 40.19 kg/m  Wt Readings from Last 3 Encounters:  02/06/23 249 lb (112.9 kg)  11/07/22 247 lb 2 oz (112.1 kg)  10/08/22 244 lb (110.7 kg)       Assessment & Plan:   Problem List Items Addressed This Visit       Unprioritized   HTN (hypertension)    BP at goal. Continue amlodipine, hctz and entresto for CHF.      Relevant Orders   Comp Met (CMET)   Diabetes type 2, controlled ([place])    Last A1c was at goal. Continue metformin.       Relevant Orders   Lipid panel   HgB A1c   Coronary artery disease    He has not  seen cardiology since 2022, will arrange follow up.       Relevant Orders   Ambulatory referral to Cardiology   Benign paroxysmal positional vertigo due to bilateral vestibular disorder    Uncontrolled.  He has had issues in the past. Will refer back to PT for vestibular rehab.  Encouraged pt to [place] a cane. Orthostatics are unremarkable.        Relevant Orders   Ambulatory referral to Physical Therapy   Other Visit Diagnoses     Hypogonadism in male    -  Primary   Relevant Orders   Ambulatory referral to Urology       I have discontinued Mitzi Hansen B. Hosek "Andy"'s FREESTYLE LITE. I am also having him maintain his Testosterone, freestyle, colchicine, clobetasol cream, Restasis, ezetimibe,  famotidine, atorvastatin, clopidogrel, hydrochlorothiazide, metFORMIN, amLODipine, and Entresto.  No orders of the defined types were placed in this encounter.

## 2023-02-06 NOTE — Assessment & Plan Note (Addendum)
Uncontrolled.  He has had issues in the past. Will refer back to PT for vestibular rehab.  Encouraged pt to [place] a cane. Orthostatics are unremarkable.

## 2023-02-06 NOTE — Assessment & Plan Note (Signed)
BP at goal. Continue amlodipine, hctz and entresto for CHF.

## 2023-02-06 NOTE — Assessment & Plan Note (Signed)
He has not seen cardiology since 2022, will arrange follow up.

## 2023-02-08 ENCOUNTER — Ambulatory Visit: Payer: Medicare Other | Admitting: Family

## 2023-02-14 DIAGNOSIS — K5730 Diverticulosis of large intestine without perforation or abscess without bleeding: Secondary | ICD-10-CM | POA: Diagnosis not present

## 2023-02-14 DIAGNOSIS — Z09 Encounter for follow-up examination after completed treatment for conditions other than malignant neoplasm: Secondary | ICD-10-CM | POA: Diagnosis not present

## 2023-02-14 DIAGNOSIS — Z86010 Personal history of colonic polyps: Secondary | ICD-10-CM | POA: Diagnosis not present

## 2023-02-18 ENCOUNTER — Encounter: Payer: Self-pay | Admitting: Urology

## 2023-02-18 ENCOUNTER — Other Ambulatory Visit: Payer: Self-pay | Admitting: Family

## 2023-02-18 ENCOUNTER — Other Ambulatory Visit: Payer: Self-pay | Admitting: Cardiology

## 2023-02-18 ENCOUNTER — Ambulatory Visit (INDEPENDENT_AMBULATORY_CARE_PROVIDER_SITE_OTHER): Payer: Medicare Other | Admitting: Urology

## 2023-02-18 VITALS — BP 142/88 | HR 62 | Ht 66.0 in | Wt 249.0 lb

## 2023-02-18 DIAGNOSIS — N401 Enlarged prostate with lower urinary tract symptoms: Secondary | ICD-10-CM | POA: Diagnosis not present

## 2023-02-18 DIAGNOSIS — N138 Other obstructive and reflux uropathy: Secondary | ICD-10-CM

## 2023-02-18 DIAGNOSIS — E291 Testicular hypofunction: Secondary | ICD-10-CM | POA: Diagnosis not present

## 2023-02-18 DIAGNOSIS — N529 Male erectile dysfunction, unspecified: Secondary | ICD-10-CM

## 2023-02-18 HISTORY — DX: Other obstructive and reflux uropathy: N13.8

## 2023-02-18 LAB — URINALYSIS, ROUTINE W REFLEX MICROSCOPIC
Bilirubin, UA: NEGATIVE
Glucose, UA: NEGATIVE
Ketones, UA: NEGATIVE
Leukocytes,UA: NEGATIVE
Nitrite, UA: NEGATIVE
Protein,UA: NEGATIVE
RBC, UA: NEGATIVE
Specific Gravity, UA: 1.025 (ref 1.005–1.030)
Urobilinogen, Ur: 0.2 mg/dL (ref 0.2–1.0)
pH, UA: 6 (ref 5.0–7.5)

## 2023-02-18 MED ORDER — TESTOSTERONE 1.62 % TD GEL: 1.0000 | Freq: Every day | TRANSDERMAL | 1 refills | Status: DC

## 2023-02-18 MED ORDER — TADALAFIL 20 MG PO TABS: 20.0000 mg | ORAL_TABLET | Freq: Every day | ORAL | 3 refills | Status: DC | PRN

## 2023-02-18 NOTE — Telephone Encounter (Signed)
Rx refill sent to pharmacy. 

## 2023-02-18 NOTE — Progress Notes (Signed)
Assessment: 1. Hypogonadism male   2. Organic impotence   3. BPH with obstruction/lower urinary tract symptoms     Plan: I personally reviewed the patient's chart including provider notes, and lab results. I reviewed records from [facility] and [facility]. Labs in 6 weeks: PSA, CBC, testosterone Refill of Androgel 1.62% 1 pump every other day.  Rx sent to [facility] Refill of tadalafil 20 mg prn sent. Return for labs in 6 weeks - will call with results He is not interested in any treatment for his LUTS at this time. Return to office in 6 months  Chief Complaint:  Chief Complaint  Patient presents with   Hypogonadism    History of Present Illness:  Steven Payne is a 77 y.o. male who is seen in consultation from Steven Alar, Steven Payne for evaluation of hypogonadism. He was previously followed by Steven Payne at [facility].  He initially presented with decreased energy and decreased muscle mass as well as erectile dysfunction.  His testosterone was found to be 89.  He was prescribed topical testosterone replacement therapy.  He noted significant improvement in his symptoms with therapy.  He was using 1 pump daily of AndroGel.  He was last seen by Steven Payne in 2/22.  He was subsequently seen by Steven Payne at [facility] in April 2023.  At that time, he was using AndroGel 1 pump every other day and Cialis 20 mg as needed for erectile dysfunction. Labs from 4/23: PSA 0.6 H&H 14.4/43.8 Testosterone 167  He has not used AndroGel for approximately 5 months as his prescription expired and he has been unable to get a refill.  He reports a return of his hypogonadal symptoms.  He has decreased energy, erectile dysfunction, and difficulty losing weight.  He also reports a decrease in his libido.  He continues to have problems with erectile dysfunction.  He has not used Cialis in some time.  This was previously working well for him.  He does report  some lower urinary tract symptoms including frequency, decreased force of stream, and nocturia x 2.  He feels like he empties his bladder well.  No dysuria or gross hematuria.  His symptoms are not particularly bothersome for him. IPSS = 20 today.   Past Medical History:  Past Medical History:  Diagnosis Date   Arthritis    Ascending aorta dilatation ([place]) 07/19/2021   Atherosclerosis of native coronary artery of native heart with angina pectoris ([place]) 02/23/2016   Coronary artery disease cardiologist-  dr Irish Lack   per cardiac cath 06/ 2013--  no sig. 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/2019   Diabetes mellitus due to underlying condition with unspecified complications ([place]) 123XX123   Diabetes type 2, controlled ([place]) 02/23/2016   ED (erectile dysfunction)    Flatulence 09/29/2021   GERD (gastroesophageal reflux disease)    Gout 02/23/2016   History of COVID-19 05/24/2021   History of gout    approx 2002   HTN (hypertension) 02/23/2016   Hyperlipidemia 02/23/2016   Hypogonadism male    Left hydrocele    Morbid obesity ([place]) 07/19/2021   OSA (obstructive sleep apnea) 05/24/2021   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   ??   HYDROCELE EXCISION Left 09/10/2016   Procedure: HYDROCELECTOMY ADULT;  Surgeon: Steven Rhodes, Steven Payne;  Location: [facility];  Service: Urology;  Laterality: Left;   UMBILICAL HERNIA REPAIR  1980's    Allergies:   Allergies  Allergen Reactions   Albiglutide Other (See Comments) and Itching    constipation    Family History:  Family History  Problem Relation Age of Onset   Coronary artery disease Father    Diabetes Father    Prostate cancer Father        died at 30   Alcohol abuse Father    Coronary artery disease Mother        died age 86   Alcohol abuse Mother    Cancer Mother        unsure, + thyroid cancer   Breast cancer Paternal Grandmother     Social History:  Social History   Tobacco Use   Smoking status: Former    Years: 2    Types: Cigarettes    Quit date: 03/27/1991    Years since quitting: 31.9   Smokeless tobacco: Never   Tobacco comments:    light smoker (smoked on 2 cigarettes per day for almost 2 years)  Vaping Use   Vaping Use: Never used  Substance Use Topics   Alcohol use: No    Alcohol/week: 0.0 standard drinks of alcohol   Drug use: No    Review of symptoms:  Constitutional:  Negative for unexplained weight loss, night sweats, fever, chills ENT:  Negative for nose bleeds, sinus pain, painful swallowing CV:  Negative for chest pain, shortness of breath, exercise intolerance, palpitations, loss of consciousness Resp:  Negative for cough, wheezing, shortness of breath GI:  Negative for nausea, vomiting, diarrhea, bloody stools GU:  Positives noted in HPI; otherwise negative for gross hematuria, dysuria, urinary incontinence Neuro:  Negative for seizures, poor balance, limb weakness, slurred speech Psych:  Negative for lack of energy, depression, anxiety Endocrine:  Negative for polydipsia, polyuria, symptoms of hypoglycemia (dizziness, hunger, sweating) Hematologic:  Negative for anemia, purpura, petechia, prolonged or excessive bleeding, use of anticoagulants  Allergic:  Negative for difficulty breathing or choking as a result of exposure to anything; no shellfish allergy; no allergic response (rash/itch) to materials, foods  Physical exam: BP (!)  142/88   Pulse 62   Ht 5\' 6"  (1.676 m)   Wt 249 lb (112.9 kg)   BMI 40.19 kg/m  GENERAL APPEARANCE:  Well appearing, well developed, well nourished, NAD HEENT: Atraumatic, Normocephalic, oropharynx clear. NECK: Supple without lymphadenopathy or thyromegaly. LUNGS: Clear to auscultation bilaterally. HEART: Regular Rate and Rhythm without murmurs, gallops, or rubs. ABDOMEN: Soft, non-tender, No Masses. EXTREMITIES: Moves all extremities well.  Without clubbing, cyanosis, or edema. NEUROLOGIC:  Alert and oriented x 3, normal gait, CN II-XII grossly intact.  MENTAL STATUS:  Appropriate. BACK:  Non-tender to palpation.  No CVAT SKIN:  Warm, dry and intact.   GU: Penis:  circumcised Meatus: Normal Scrotum: normal, no masses Testis: normal without masses bilateral Epididymis: normal Prostate: 40 gm, NT, no nodules Rectum: Normal tone,  no masses or tenderness  Results: U/A: 1+ bilirubin

## 2023-02-18 NOTE — Addendum Note (Signed)
Addended by: Evelina Bucy on: 02/18/2023 10:12 AM   Modules accepted: Orders

## 2023-02-22 ENCOUNTER — Telehealth: Payer: Self-pay | Admitting: Urology

## 2023-02-22 ENCOUNTER — Other Ambulatory Visit: Payer: Self-pay | Admitting: Family

## 2023-02-22 NOTE — Telephone Encounter (Signed)
Spoke with pharmacists @ [facility] who reports patient has a severe sildenafil allergy listed from 2022. Advised pharmacists pt does not have that allergy listed in his chart. Pharmacists then asked me to call pt to verify. Called pt to inquire ab allergy, pt states he does not have a sildenafil allergy and he is unsure why ES has this information listed for him. Pt states he will call [facility] to let them know he does not have a sildenafil allergy so his Rx may be filled.

## 2023-02-22 NOTE — Telephone Encounter (Signed)
[facility] called in regards to a medication that was prescribed by Dr. Felipa Eth to patient and stated he had an allergy to this medication that was reported in 2022 and had some questions regarding this.  [facility] : [phone] Reference Number: [id number]

## 2023-02-26 ENCOUNTER — Other Ambulatory Visit: Payer: Self-pay | Admitting: Urology

## 2023-02-26 ENCOUNTER — Other Ambulatory Visit: Payer: Self-pay

## 2023-02-26 DIAGNOSIS — N529 Male erectile dysfunction, unspecified: Secondary | ICD-10-CM

## 2023-02-26 MED ORDER — SILDENAFIL CITRATE 100 MG PO TABS: 100.0000 mg | ORAL_TABLET | Freq: Every day | ORAL | 11 refills | Status: DC | PRN

## 2023-03-07 ENCOUNTER — Encounter: Payer: Self-pay | Admitting: Cardiology

## 2023-03-07 ENCOUNTER — Ambulatory Visit: Payer: Medicare Other | Attending: Cardiology | Admitting: Cardiology

## 2023-03-07 VITALS — BP 152/84 | HR 57 | Ht 66.0 in | Wt 257.0 lb

## 2023-03-07 DIAGNOSIS — E088 Diabetes mellitus due to underlying condition with unspecified complications: Secondary | ICD-10-CM | POA: Insufficient documentation

## 2023-03-07 DIAGNOSIS — G4733 Obstructive sleep apnea (adult) (pediatric): Secondary | ICD-10-CM | POA: Diagnosis not present

## 2023-03-07 DIAGNOSIS — E782 Mixed hyperlipidemia: Secondary | ICD-10-CM | POA: Insufficient documentation

## 2023-03-07 DIAGNOSIS — I25119 Atherosclerotic heart disease of native coronary artery with unspecified angina pectoris: Secondary | ICD-10-CM | POA: Diagnosis not present

## 2023-03-07 MED ORDER — ENTRESTO 24-26 MG PO TABS: 1.0000 | ORAL_TABLET | Freq: Two times a day (BID) | ORAL | 3 refills | Status: DC

## 2023-03-07 NOTE — Progress Notes (Signed)
Cardiology Office Note:    Date:  03/07/2023   ID:  Steven Payne, DOB October 30, 1946, MRN [id number]  PCP:  [name], NP  Cardiologist:  Jenean Lindau, MD   Referring MD: [name], NP    ASSESSMENT:    1. Atherosclerosis of native coronary artery of native heart with angina pectoris   2. OSA (obstructive sleep apnea)   3. Diabetes mellitus due to underlying condition with unspecified complications   4. Morbid obesity   5. Mixed hyperlipidemia    PLAN:    In order of problems listed above:  Coronary artery disease: Secondary prevention stressed with the patient.  Importance of compliance with diet medication stressed and he vocalized understanding.  He was advised to walk at least half an hour a day 5 days a week and he promises to do so. Essential hypertension: Blood pressure is stable and diet was emphasized.  Lifestyle modification urged.  He tells me that he did not sleep well last night so blood pressure is elevated otherwise he tells me it is under good control and mentioned to me the numbers. Mixed dyslipidemia: On lipid-lowering medications and recent blood work was reviewed and discussed with the patient. Diabetes mellitus: Managed by primary care.  Diet emphasized. Obesity and sleep apnea: Sleep health issues were discussed and he promises to do better.  Risks of obesity explained.   Medication Adjustments/Labs and Tests Ordered: Current medicines are reviewed at length with the patient today.  Concerns regarding medicines are outlined above.  No orders of the defined types were placed in this encounter.  No orders of the defined types were placed in this encounter.    No chief complaint on file.    History of Present Illness:    Steven Payne is a 77 y.o. male.  Patient has past medical history of essential hypertension, coronary artery disease, mixed dyslipidemia, diabetes mellitus and morbid obesity.  He has sleep apnea and uses his  machine meticulously.  No chest pain orthopnea or PND.  At the time of my evaluation, the patient is alert awake oriented and in no distress.  Past Medical History:  Diagnosis Date   Arthritis    Ascending aorta dilatation 07/19/2021   Asymmetrical sensorineural hearing loss 01/01/2023   Atherosclerosis of native coronary artery of native heart with angina pectoris 02/23/2016   Benign paroxysmal positional vertigo due to bilateral vestibular disorder 02/06/2023   BPH with obstruction/lower urinary tract symptoms 02/18/2023   Coronary artery disease cardiologist-  dr Irish Lack   per cardiac cath 06/ 2013--  no sig. 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   ??   HYDROCELE EXCISION Left 09/10/2016   Procedure: HYDROCELECTOMY ADULT;  Surgeon: Kathie Rhodes, MD;  Location: [facility];  Service: Urology;  Laterality: Left;   UMBILICAL HERNIA REPAIR  1980's    Current Medications: Current Meds  Medication Sig   amLODipine (NORVASC) 5 MG tablet Take 1 tablet (5 mg total) by mouth daily.   atorvastatin (LIPITOR) 80 MG tablet Take 80 mg by mouth daily.   clobetasol cream (TEMOVATE) [id number] % Apply 1 application topically daily.   clopidogrel (PLAVIX) 75 MG tablet TAKE 1 TABLET DAILY   colchicine 0.6 MG tablet Take 1 tablet (0.6 mg total) by mouth daily as needed (Gout). gout   ezetimibe (ZETIA) 10 MG tablet Take 10 mg by mouth daily.   famotidine (PEPCID) 20 MG tablet TAKE 1 TABLET TWICE A DAY   hydrochlorothiazide (HYDRODIURIL) 25 MG tablet TAKE 1 TABLET DAILY.   Lancets (FREESTYLE) lancets TEST UP TO FOUR TIMES DAILY   metFORMIN (GLUCOPHAGE) 1000 MG tablet TAKE 1 TABLET TWICE A DAY WITH MEALS ( THIS SHOULD REPLACE 500 MG TWICE A DAY )   RESTASIS 0.05 % ophthalmic emulsion Place 1 drop into both eyes daily.   sacubitril-valsartan (ENTRESTO) 24-26 MG Take 1 tablet by mouth 2 (two) times daily. Needs appointment for future refills   sildenafil (VIAGRA) 100 MG tablet Take 1 tablet (100 mg total) by mouth daily as needed for erectile dysfunction.   Testosterone 1.62 % GEL Place 1 Pump onto the skin daily.     Allergies:   Albiglutide   Social History   Socioeconomic History   Marital status: Married    Spouse name: Not on file   Number of children: Not on file   Years of education: Not on file   Highest education level: Not on file  Occupational History   Not on file  Tobacco Use   Smoking status: Former    Years: 2    Types: Cigarettes    Quit date: 03/27/1991    Years since quitting: 31.9   Smokeless tobacco: Never   Tobacco comments:    light smoker (smoked on 2 cigarettes per day for almost 2  years)  Vaping Use   Vaping Use: Never used  Substance and Sexual Activity   Alcohol use: No    Alcohol/week: 0.0 standard drinks of alcohol   Drug use: No   Sexual activity: Not on file  Other Topics Concern   Not on file  Social History Narrative   Retired Corporate treasurer, 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  No grandchildren.  Oldest in [place], youngest in [place]   Completed bachelors, some graduate (Estate manager/land agent)   Enjoys travelling, walking, cruises   Social Determinants of Health   Financial Resource Strain: Low Risk  (01/01/2022)   Overall Financial Resource Strain (CARDIA)    Difficulty of Paying Living Expenses: Not hard at all  Food Insecurity: No  Food Insecurity (01/01/2022)   Hunger Vital Sign    Worried About Running Out of Food in the Last Year: Never true    Ran Out of Food in the Last Year: Never true  Transportation Needs: No Transportation Needs (01/01/2022)   PRAPARE - Hydrologist (Medical): No    Lack of Transportation (Non-Medical): No  Physical Activity: Sufficiently Active (01/01/2022)   Exercise Vital Sign    Days of Exercise per Week: 3 days    Minutes of Exercise per Session: 60 min  Stress: No Stress Concern Present (01/01/2022)   [place]    Feeling of Stress : Not at all  Social Connections: Moderately Integrated (01/01/2022)   Social Connection and Isolation Panel [NHANES]    Frequency of Communication with Friends and Family: More than three times a week    Frequency of Social Gatherings with Friends and Family: Twice a week    Attends Religious Services: More than 4 times per year    Active Member of [employer] or Organizations: No    Attends Music therapist: Never    Marital Status: Married     Family History: The patient's family history includes  Alcohol abuse in his father and mother; Breast cancer in his paternal grandmother; Cancer in his mother; Coronary artery disease in his father and mother; Diabetes in his father; Prostate cancer in his father.  ROS:   Please see the history of present illness.    All other systems reviewed and are negative.  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   Recent Labs: 02/06/2023: ALT 5; BUN 15; Creatinine, Ser 0.74; Potassium 4.0; Sodium 141  Recent Lipid Panel    Component Value Date/Time   CHOL 133 02/06/2023 0738   CHOL 158 03/30/2022 0956   TRIG 89.0 02/06/2023 0738   HDL 46.00 02/06/2023 0738   HDL 44 03/30/2022 0956   CHOLHDL 3 02/06/2023 0738   VLDL 17.8 02/06/2023 0738   LDLCALC 69 02/06/2023 0738   LDLCALC 96 03/30/2022 0956    Physical Exam:    VS:  BP (!) 152/84   Pulse (!) 57   Ht 5\' 6"  (1.676 m)   Wt 257 lb 0.6 oz (116.6 kg)   SpO2 99%   BMI 41.49 kg/m     Wt Readings from Last 3 Encounters:  03/07/23 257 lb 0.6 oz (116.6 kg)  02/18/23 249 lb (112.9 kg)  02/06/23 249 lb (112.9 kg)     GEN: Patient is in no acute distress HEENT: Normal NECK: No JVD; No carotid bruits LYMPHATICS: No lymphadenopathy CARDIAC: Hear sounds regular, 2/6 systolic murmur at the apex. RESPIRATORY:  Clear to auscultation without rales, wheezing or rhonchi  ABDOMEN: Soft, non-tender, non-distended MUSCULOSKELETAL:  No edema; No deformity  SKIN: Warm and dry NEUROLOGIC:  Alert and oriented x 3 PSYCHIATRIC:  Normal affect   Signed, Jenean Lindau, MD  03/07/2023 8:23 AM    [place]

## 2023-03-07 NOTE — Patient Instructions (Signed)
Medication Instructions:  Your physician recommends that you continue on your current medications as directed. Please refer to the Current Medication list given to you today.  *If you need a refill on your cardiac medications before your next appointment, please call your pharmacy*   Lab Work: None ordered If you have labs (blood work) drawn today and your tests are completely normal, you will receive your results only by: Loop (if you have MyChart) OR A paper copy in the mail If you have any lab test that is abnormal or we need to change your treatment, we will call you to review the results.   Testing/Procedures: None ordered   Follow-Up: At [facility], you and your health needs are our priority.  As part of our continuing mission to provide you with exceptional heart care, we have created designated Provider Care Teams.  These Care Teams include your primary Cardiologist (physician) and Advanced Practice Providers (APPs -  Physician Assistants and Nurse Practitioners) who all work together to provide you with the care you need, when you need it.  We recommend signing up for the patient portal called "MyChart".  Sign up information is provided on this After Visit Summary.  MyChart is used to connect with patients for Virtual Visits (Telemedicine).  Patients are able to view lab/test results, encounter notes, upcoming appointments, etc.  Non-urgent messages can be sent to your provider as well.   To learn more about what you can do with MyChart, go to [email or web address].    Your next appointment:   9 month(s)  The format for your next appointment:   In Person  Provider:   Jenne Campus, MD    Other Instructions none  Important Information About Sugar

## 2023-03-08 ENCOUNTER — Telehealth: Payer: Self-pay | Admitting: Family

## 2023-03-08 NOTE — Telephone Encounter (Signed)
Contacted Steven Payne to schedule their annual wellness visit. Appointment made for 03/19/2023.  Verlee Rossetti; Care Guide Ambulatory Clinical Support [place] [facility] Direct Dial: [phone]

## 2023-03-13 DIAGNOSIS — M19072 Primary osteoarthritis, left ankle and foot: Secondary | ICD-10-CM | POA: Diagnosis not present

## 2023-03-13 DIAGNOSIS — M7731 Calcaneal spur, right foot: Secondary | ICD-10-CM | POA: Diagnosis not present

## 2023-03-13 DIAGNOSIS — M19071 Primary osteoarthritis, right ankle and foot: Secondary | ICD-10-CM | POA: Diagnosis not present

## 2023-03-13 DIAGNOSIS — M7732 Calcaneal spur, left foot: Secondary | ICD-10-CM | POA: Diagnosis not present

## 2023-03-18 ENCOUNTER — Other Ambulatory Visit: Payer: Self-pay

## 2023-03-18 MED ORDER — ENTRESTO 24-26 MG PO TABS: 1.0000 | ORAL_TABLET | Freq: Two times a day (BID) | ORAL | 3 refills | Status: AC

## 2023-03-19 ENCOUNTER — Ambulatory Visit (INDEPENDENT_AMBULATORY_CARE_PROVIDER_SITE_OTHER): Payer: Medicare Other | Admitting: *Deleted

## 2023-03-19 VITALS — BP 129/86 | HR 67 | Ht 66.0 in | Wt 247.0 lb

## 2023-03-19 DIAGNOSIS — Z0000 Encounter for general adult medical examination without abnormal findings: Secondary | ICD-10-CM | POA: Diagnosis not present

## 2023-03-19 NOTE — Progress Notes (Signed)
Subjective:  Pt completed ADLs, Fall risk, and SDOH during e-check in on 03/18/23.  Answers verified with pt.    Steven Payne is a 77 y.o. male who presents for Medicare Annual/Subsequent preventive examination.  I connected with  Steven Payne on 03/19/23 by a audio enabled telemedicine application and verified that I am speaking with the correct person using two identifiers.  Patient Location: Home  Provider Location: Office/Clinic  I discussed the limitations of evaluation and management by telemedicine. The patient expressed understanding and agreed to proceed.   Review of Systems     Cardiac Risk Factors include: advanced age (>46men, >37 women);male gender;diabetes mellitus;dyslipidemia;obesity (BMI >30kg/m2);hypertension     Objective:    Today's Vitals   03/18/23 1431  BP: 129/86  Pulse: 67  Weight: 247 lb (112 kg)  Height: 5\' 6"  (1.676 m)  PainSc: 7    Body mass index is 39.87 kg/m.     03/19/2023    8:29 AM 12/22/2022    7:49 AM 08/30/2022    8:56 AM 01/01/2022    7:43 AM 11/04/2021    9:18 AM 10/31/2021    8:49 AM 05/02/2021   11:17 AM  Advanced Directives  Does Patient Have a Medical Advance Directive? No No No No No No No  Would patient like information on creating a medical advance directive? No - Patient declined  Yes (MAU/Ambulatory/Procedural Areas - Information given) No - Patient declined No - Patient declined No - Patient declined No - Patient declined    Current Medications (verified) Outpatient Encounter Medications as of 03/19/2023  Medication Sig   amLODipine (NORVASC) 5 MG tablet Take 1 tablet (5 mg total) by mouth daily.   atorvastatin (LIPITOR) 80 MG tablet Take 80 mg by mouth daily.   clobetasol cream (TEMOVATE) 0.05 % Apply 1 application topically daily.   clopidogrel (PLAVIX) 75 MG tablet TAKE 1 TABLET DAILY   colchicine 0.6 MG tablet Take 1 tablet (0.6 mg total) by mouth daily as needed (Gout). gout   ezetimibe (ZETIA) 10 MG tablet  Take 10 mg by mouth daily.   famotidine (PEPCID) 20 MG tablet TAKE 1 TABLET TWICE A DAY   hydrochlorothiazide (HYDRODIURIL) 25 MG tablet TAKE 1 TABLET DAILY.   Lancets (FREESTYLE) lancets TEST UP TO FOUR TIMES DAILY   metFORMIN (GLUCOPHAGE) 1000 MG tablet TAKE 1 TABLET TWICE A DAY WITH MEALS ( THIS SHOULD REPLACE 500 MG TWICE A DAY )   RESTASIS 0.05 % ophthalmic emulsion Place 1 drop into both eyes daily.   sacubitril-valsartan (ENTRESTO) 24-26 MG Take 1 tablet by mouth 2 (two) times daily. Needs appointment for future refills   sildenafil (VIAGRA) 100 MG tablet Take 1 tablet (100 mg total) by mouth daily as needed for erectile dysfunction.   Testosterone 1.62 % GEL Place 1 Pump onto the skin daily.   No facility-administered encounter medications on file as of 03/19/2023.    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   ??   HERNIA REPAIR  1990   HYDROCELE EXCISION Left 09/10/2016   Procedure: HYDROCELECTOMY ADULT;  Surgeon: Ihor Gully, MD;  Location: [facility];  Service: Urology;  Laterality: Left;   UMBILICAL HERNIA REPAIR  1980's   Family History  Problem Relation Age of Onset   Coronary artery disease Father    Diabetes Father    Prostate cancer Father        died at 63   Alcohol abuse Father    Coronary artery disease Mother        died age 60   Alcohol abuse Mother    Cancer Mother        unsure, + thyroid cancer   Breast cancer Paternal Grandmother    Social History   Socioeconomic History   Marital status: Married    Spouse name: Not on file   Number of children: Not on file   Years of education: Not on file   Highest education level: Not on file  Occupational History   Not on file  Tobacco Use   Smoking status: Former    Years: 2    Types: Cigarettes    Quit date: 03/27/1991    Years since quitting: 32.0   Smokeless tobacco: Never   Tobacco comments:    light smoker (smoked on 2 cigarettes per day for almost 2 years)  Vaping Use   Vaping Use: Never used  Substance and Sexual  Activity   Alcohol use: Yes    Alcohol/week: 1.0 standard drink of alcohol    Types: 1 Standard drinks or equivalent per week   Drug use: No   Sexual activity: Yes  Other Topics Concern   Not on file  Social History Narrative   Retired Electronics engineer, 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  No grandchildren.  Oldest in [place], youngest in [place]   Completed bachelors, some graduate (Actuary)   Enjoys travelling, walking, cruises   Social Determinants of Health  Financial Resource Strain: Low Risk  (03/18/2023)   Overall Financial Resource Strain (CARDIA)    Difficulty of Paying Living Expenses: Not very hard  Food Insecurity: No Food Insecurity (03/18/2023)   Hunger Vital Sign    Worried About Running Out of Food in the Last Year: Never true    Ran Out of Food in the Last Year: Never true  Transportation Needs: No Transportation Needs (03/18/2023)   PRAPARE - Administrator, Civil Service (Medical): No    Lack of Transportation (Non-Medical): No  Physical Activity: Insufficiently Active (03/18/2023)   Exercise Vital Sign    Days of Exercise per Week: 3 days    Minutes of Exercise per Session: 40 min  Stress: No Stress Concern Present (03/18/2023)   [employer] of Occupational Health - Occupational Stress Questionnaire    Feeling of Stress : Only a little  Social Connections: Unknown (03/18/2023)   Social Connection and Isolation Panel [NHANES]    Frequency of Communication with Friends and Family: Three times a week    Frequency of Social Gatherings with Friends and Family: Once a week    Attends Religious Services: Not on Marketing executive or Organizations: Yes    Attends Engineer, structural: More than 4 times per year    Marital Status: Married    Tobacco Counseling Counseling given: Not Answered Tobacco comments: light smoker (smoked on 2  cigarettes per day for almost 2 years)   Clinical Intake:  Pre-visit preparation completed: Yes  Pain : 0-10 Pain Score: 7  Pain Location: Foot Pain Orientation: Left, Right Pain Descriptors / Indicators: Aching Pain Onset: More than a month ago Pain Frequency: Intermittent  BMI - recorded: 39.87 Nutritional Status: BMI > 30  Obese Nutritional Risks: None Diabetes: Yes CBG done?: No Did pt. bring in CBG monitor from home?: No  How often do you need to have someone help you when you read instructions, pamphlets, or other written materials from your doctor or pharmacy?: 3 - Sometimes   Activities of Daily Living    03/18/2023    2:31 PM  In your present state of health, do you have any difficulty performing the following activities:  Hearing? 0  Vision? 1  Difficulty concentrating or making decisions? 1  Walking or climbing stairs? 1  Dressing or bathing? 0  Doing errands, shopping? 1  Preparing Food and eating ? N  Using the Toilet? N  In the past six months, have you accidently leaked urine? Y  Do you have problems with loss of bowel control? N  Managing your Medications? N  Managing your Finances? N  Housekeeping or managing your Housekeeping? N    Patient Care Team: Sandford Craze, NP as PCP - General (Internal Medicine) Shon Millet, MD as Consulting Physician (Ophthalmology)  Indicate any recent Medical Services you may have received from other than [facility] providers in the past year (date may be approximate).     Assessment:   This is a routine wellness examination for Rilley.  Hearing/Vision screen No results found.  Dietary issues and exercise activities discussed: Current Exercise Habits: Home exercise routine, Type of exercise: Other - see comments;walking (pull up machines), Time (Minutes): 30, Frequency (Times/Week): 3, Weekly Exercise (Minutes/Week): 90, Intensity: Mild, Exercise limited by: orthopedic condition(s) (plantar faciitis)    Goals Addressed   None    Depression Screen    03/19/2023    8:31 AM 11/07/2022    8:44  AM 01/01/2022    7:45 AM 05/24/2021    7:24 AM 06/01/2020   11:15 AM 09/16/2019    3:01 PM 09/08/2018    7:08 AM  PHQ 2/9 Scores  PHQ - 2 Score 1 0 0 0 0 0 0    Fall Risk    03/18/2023    2:31 PM 11/07/2022    8:44 AM 01/01/2022    7:44 AM 12/31/2021    3:25 PM 05/24/2021    7:24 AM  Fall Risk   Falls in the past year? 1 0 0 0 0  Number falls in past yr: 1 0 0 0 0  Injury with Fall? 1 0 0 1 0  Risk for fall due to : History of fall(s)    No Fall Risks  Follow up Falls evaluation completed Falls evaluation completed Falls prevention discussed  Falls evaluation completed    FALL RISK PREVENTION PERTAINING TO THE HOME:  Any stairs in or around the home? No  Home free of loose throw rugs in walkways, pet beds, electrical cords, etc? Yes  Adequate lighting in your home to reduce risk of falls? Yes   ASSISTIVE DEVICES UTILIZED TO PREVENT FALLS:  Life alert? No  Use of a cane, walker or w/c?  Uses cane sometimes Grab bars in the bathroom? Yes  Shower chair or bench in shower?  Built in seat Elevated toilet seat or a handicapped toilet? Yes   TIMED UP AND GO:  Was the test performed?  No, audio visit .    Cognitive Function:    03/26/2016    9:36 AM  MMSE - Mini Mental State Exam  Orientation to time 5  Orientation to Place 5  Registration 3  Attention/ Calculation 5  Recall 2  Language- name 2 objects 2  Language- repeat 1  Language- follow 3 step command 3  Language- read & follow direction 1  Write a sentence 1  Copy design 1  Total score 29        03/19/2023    8:37 AM  6CIT Screen  What Year? 0 points  What month? 0 points  What time? 0 points  Count back from 20 0 points  Months in reverse 0 points  Repeat phrase 0 points  Total Score 0 points    Immunizations Immunization History  Administered Date(s) Administered   COVID-19, mRNA, vaccine(Comirnaty)12 years  and older 09/13/2022   Fluad Quad(high Dose 65+) 08/13/2019, 09/02/2020, 08/22/2021, 08/29/2022   Influenza, High Dose Seasonal PF 08/25/2015, 09/04/2016, 08/23/2017, 09/08/2018   PFIZER Comirnaty(Gray Top)Covid-19 Tri-Sucrose Vaccine 03/04/2021   PFIZER(Purple Top)SARS-COV-2 Vaccination 01/08/2020, 02/02/2020, 09/07/2020   Pfizer Covid-19 Vaccine Bivalent Booster 79yrs & up 09/18/2021, 09/18/2021   Pneumococcal Conjugate-13 11/06/2016   Pneumococcal Polysaccharide-23 11/23/2010, 09/08/2018   Td 11/06/2016   Zoster Recombinat (Shingrix) 07/15/2017, 10/29/2017    TDAP status: Up to date  Flu Vaccine status: Up to date  Pneumococcal vaccine status: Up to date  Covid-19 vaccine status: Information provided on how to obtain vaccines.   Qualifies for Shingles Vaccine? Yes   Zostavax completed No   Shingrix Completed?: Yes  Screening Tests Health Maintenance  Topic Date Due   COVID-19 Vaccine (7 - 2023-24 season) 11/08/2022   Medicare Annual Wellness (AWV)  01/01/2023   INFLUENZA VACCINE  07/04/2023   OPHTHALMOLOGY EXAM  07/07/2023   HEMOGLOBIN A1C  08/09/2023   Diabetic kidney evaluation - Urine ACR  11/08/2023   Diabetic kidney evaluation - eGFR measurement  02/06/2024  FOOT EXAM  02/06/2024   DTaP/Tdap/Td (2 - Tdap) 11/06/2026   Pneumonia Vaccine 89+ Years old  Completed   Hepatitis C Screening  Completed   Zoster Vaccines- Shingrix  Completed   HPV VACCINES  Aged Out   COLONOSCOPY (Pts 45-65yrs Insurance coverage will need to be confirmed)  Discontinued    Health Maintenance  Health Maintenance Due  Topic Date Due   COVID-19 Vaccine (7 - 2023-24 season) 11/08/2022   Medicare Annual Wellness (AWV)  01/01/2023    Colorectal cancer screening: No longer required.   Lung Cancer Screening: (Low Dose CT Chest recommended if Age 73-80 years, 30 pack-year currently smoking OR have quit w/in 15years.) does not qualify.   Additional Screening:  Hepatitis C Screening: does  qualify; Completed 03/11/17  Vision Screening: Recommended annual ophthalmology exams for early detection of glaucoma and other disorders of the eye. Is the patient up to date with their annual eye exam?  Yes  Who is the provider or what is the name of the office in which the patient attends annual eye exams? [facility]. If pt is not established with a provider, would they like to be referred to a provider to establish care? No .   Dental Screening: Recommended annual dental exams for proper oral hygiene  Community Resource Referral / Chronic Care Management: CRR required this visit?  No   CCM required this visit?  No      Plan:     I have personally reviewed and noted the following in the patient's chart:   Medical and social history Use of alcohol, tobacco or illicit drugs  Current medications and supplements including opioid prescriptions. Patient is not currently taking opioid prescriptions. Functional ability and status Nutritional status Physical activity Advanced directives List of other physicians Hospitalizations, surgeries, and ER visits in previous 12 months Vitals Screenings to include cognitive, depression, and falls Referrals and appointments  In addition, I have reviewed and discussed with patient certain preventive protocols, quality metrics, and best practice recommendations. A written personalized care plan for preventive services as well as general preventive health recommendations were provided to patient.   Due to this being a telephonic visit, the after visit summary with patients personalized plan was offered to patient via mail or my-chart.  Patient would like to access on my-chart.  Donne Anon, [place]   03/19/2023   Nurse Notes: None

## 2023-03-19 NOTE — Patient Instructions (Signed)
Steven Payne , Thank you for taking time to come for your Medicare Wellness Visit. I appreciate your ongoing commitment to your health goals. Please review the following plan we discussed and let me know if I can assist you in the future.     This is a list of the screening recommended for you and due dates:  Health Maintenance  Topic Date Due   COVID-19 Vaccine (7 - 2023-24 season) 11/08/2022   Flu Shot  07/04/2023   Eye exam for diabetics  07/07/2023   Hemoglobin A1C  08/09/2023   Yearly kidney health urinalysis for diabetes  11/08/2023   Yearly kidney function blood test for diabetes  02/06/2024   Complete foot exam   02/06/2024   Medicare Annual Wellness Visit  03/18/2024   DTaP/Tdap/Td vaccine (2 - Tdap) 11/06/2026   Pneumonia Vaccine  Completed   Hepatitis C Screening: USPSTF Recommendation to screen - Ages 52-79 yo.  Completed   Zoster (Shingles) Vaccine  Completed   HPV Vaccine  Aged Out   Colon Cancer Screening  Discontinued     Next appointment: Follow up in one year for your annual wellness visit.   Preventive Care 79 Years and Older, Male Preventive care refers to lifestyle choices and visits with your health care provider that can promote health and wellness. What does preventive care include? A yearly physical exam. This is also called an annual well check. Dental exams once or twice a year. Routine eye exams. Ask your health care provider how often you should have your eyes checked. Personal lifestyle choices, including: Daily care of your teeth and gums. Regular physical activity. Eating a healthy diet. Avoiding tobacco and drug use. Limiting alcohol use. Practicing safe sex. Taking low doses of aspirin every day. Taking vitamin and mineral supplements as recommended by your health care provider. What happens during an annual well check? The services and screenings done by your health care provider during your annual well check will depend on your age, overall  health, lifestyle risk factors, and family history of disease. Counseling  Your health care provider may ask you questions about your: Alcohol use. Tobacco use. Drug use. Emotional well-being. Home and relationship well-being. Sexual activity. Eating habits. History of falls. Memory and ability to understand (cognition). Work and work Astronomer. Screening  You may have the following tests or measurements: Height, weight, and BMI. Blood pressure. Lipid and cholesterol levels. These may be checked every 5 years, or more frequently if you are over 16 years old. Skin check. Lung cancer screening. You may have this screening every year starting at age 79 if you have a 30-pack-year history of smoking and currently smoke or have quit within the past 15 years. Fecal occult blood test (FOBT) of the stool. You may have this test every year starting at age 79 Flexible sigmoidoscopy or colonoscopy. You may have a sigmoidoscopy every 5 years or a colonoscopy every 10 years starting at age 79. Prostate cancer screening. Recommendations will vary depending on your family history and other risks. Hepatitis C blood test. Hepatitis B blood test. Sexually transmitted disease (STD) testing. Diabetes screening. This is done by checking your blood sugar (glucose) after you have not eaten for a while (fasting). You may have this done every 1-3 years. Abdominal aortic aneurysm (AAA) screening. You may need this if you are a current or former smoker. Osteoporosis. You may be screened starting at age 79 if if you are at high risk. Talk with your health care provider  about your test results, treatment options, and if necessary, the need for more tests. Vaccines  Your health care provider may recommend certain vaccines, such as: Influenza vaccine. This is recommended every year. Tetanus, diphtheria, and acellular pertussis (Tdap, Td) vaccine. You may need a Td booster every 10 years. Zoster vaccine. You may  need this after age 52. Pneumococcal 13-valent conjugate (PCV13) vaccine. One dose is recommended after age 29. Pneumococcal polysaccharide (PPSV23) vaccine. One dose is recommended after age 73. Talk to your health care provider about which screenings and vaccines you need and how often you need them. This information is not intended to replace advice given to you by your health care provider. Make sure you discuss any questions you have with your health care provider. Document Released: 12/16/2015 Document Revised: 08/08/2016 Document Reviewed: 09/20/2015 Elsevier Interactive Patient Education  2017 [place] Prevention in the Home Falls can cause injuries. They can happen to people of all ages. There are many things you can do to make your home safe and to help prevent falls. What can I do on the outside of my home? Regularly fix the edges of walkways and driveways and fix any cracks. Remove anything that might make you trip as you walk through a door, such as a raised step or threshold. Trim any bushes or trees on the path to your home. Use bright outdoor lighting. Clear any walking paths of anything that might make someone trip, such as rocks or tools. Regularly check to see if handrails are loose or broken. Make sure that both sides of any steps have handrails. Any raised decks and porches should have guardrails on the edges. Have any leaves, snow, or ice cleared regularly. Use sand or salt on walking paths during winter. Clean up any spills in your garage right away. This includes oil or grease spills. What can I do in the bathroom? Use night lights. Install grab bars by the toilet and in the tub and shower. Do not use towel bars as grab bars. Use non-skid mats or decals in the tub or shower. If you need to sit down in the shower, use a plastic, non-slip stool. Keep the floor dry. Clean up any water that spills on the floor as soon as it happens. Remove soap buildup in  the tub or shower regularly. Attach bath mats securely with double-sided non-slip rug tape. Do not have throw rugs and other things on the floor that can make you trip. What can I do in the bedroom? Use night lights. Make sure that you have a light by your bed that is easy to reach. Do not use any sheets or blankets that are too big for your bed. They should not hang down onto the floor. Have a firm chair that has side arms. You can use this for support while you get dressed. Do not have throw rugs and other things on the floor that can make you trip. What can I do in the kitchen? Clean up any spills right away. Avoid walking on wet floors. Keep items that you use a lot in easy-to-reach places. If you need to reach something above you, use a strong step stool that has a grab bar. Keep electrical cords out of the way. Do not use floor polish or wax that makes floors slippery. If you must use wax, use non-skid floor wax. Do not have throw rugs and other things on the floor that can make you trip. What can I do  with my stairs? Do not leave any items on the stairs. Make sure that there are handrails on both sides of the stairs and use them. Fix handrails that are broken or loose. Make sure that handrails are as long as the stairways. Check any carpeting to make sure that it is firmly attached to the stairs. Fix any carpet that is loose or worn. Avoid having throw rugs at the top or bottom of the stairs. If you do have throw rugs, attach them to the floor with carpet tape. Make sure that you have a light switch at the top of the stairs and the bottom of the stairs. If you do not have them, ask someone to add them for you. What else can I do to help prevent falls? Wear shoes that: Do not have high heels. Have rubber bottoms. Are comfortable and fit you well. Are closed at the toe. Do not wear sandals. If you use a stepladder: Make sure that it is fully opened. Do not climb a closed  stepladder. Make sure that both sides of the stepladder are locked into place. Ask someone to hold it for you, if possible. Clearly mark and make sure that you can see: Any grab bars or handrails. First and last steps. Where the edge of each step is. Use tools that help you move around (mobility aids) if they are needed. These include: Canes. Walkers. Scooters. Crutches. Turn on the lights when you go into a dark area. Replace any light bulbs as soon as they burn out. Set up your furniture so you have a clear path. Avoid moving your furniture around. If any of your floors are uneven, fix them. If there are any pets around you, be aware of where they are. Review your medicines with your doctor. Some medicines can make you feel dizzy. This can increase your chance of falling. Ask your doctor what other things that you can do to help prevent falls. This information is not intended to replace advice given to you by your health care provider. Make sure you discuss any questions you have with your health care provider. Document Released: 09/15/2009 Document Revised: 04/26/2016 Document Reviewed: 12/24/2014 Elsevier Interactive Patient Education  2017 [employer].

## 2023-04-01 ENCOUNTER — Other Ambulatory Visit (HOSPITAL_BASED_OUTPATIENT_CLINIC_OR_DEPARTMENT_OTHER): Payer: Self-pay

## 2023-04-01 ENCOUNTER — Other Ambulatory Visit: Payer: Medicare Other

## 2023-04-01 DIAGNOSIS — N138 Other obstructive and reflux uropathy: Secondary | ICD-10-CM | POA: Diagnosis not present

## 2023-04-01 DIAGNOSIS — E291 Testicular hypofunction: Secondary | ICD-10-CM | POA: Diagnosis not present

## 2023-04-01 DIAGNOSIS — N401 Enlarged prostate with lower urinary tract symptoms: Secondary | ICD-10-CM | POA: Diagnosis not present

## 2023-04-01 DIAGNOSIS — Z23 Encounter for immunization: Secondary | ICD-10-CM | POA: Diagnosis not present

## 2023-04-01 LAB — CBC
Hematocrit: 43.1 % (ref 37.5–51.0)
Hemoglobin: 14.4 g/dL (ref 13.0–17.7)
MCH: 28.3 pg (ref 26.6–33.0)
MCHC: 33.4 g/dL (ref 31.5–35.7)
MCV: 85 fL (ref 79–97)
Platelets: 289 10*3/uL (ref 150–450)
RBC: 5.08 x10E6/uL (ref 4.14–5.80)
RDW: 13.2 % (ref 11.6–15.4)
WBC: 4.9 10*3/uL (ref 3.4–10.8)

## 2023-04-01 MED ORDER — COMIRNATY 30 MCG/0.3ML IM SUSY
0.3000 mL | PREFILLED_SYRINGE | Freq: Once | INTRAMUSCULAR | 0 refills | Status: AC
  Filled 2023-04-01: qty 0.3, 1d supply, fill #0

## 2023-04-02 ENCOUNTER — Encounter: Payer: Self-pay | Admitting: Urology

## 2023-04-02 LAB — TESTOSTERONE: Testosterone: 207 ng/dL — ABNORMAL LOW (ref 264–916)

## 2023-04-02 LAB — PSA: Prostate Specific Ag, Serum: 0.9 ng/mL (ref 0.0–4.0)

## 2023-05-13 ENCOUNTER — Ambulatory Visit: Payer: Medicare Other | Admitting: Family

## 2023-05-20 ENCOUNTER — Other Ambulatory Visit: Payer: Self-pay | Admitting: Cardiology

## 2023-06-03 ENCOUNTER — Telehealth: Payer: Self-pay | Admitting: Family

## 2023-06-03 NOTE — Telephone Encounter (Signed)
Pt is requesting sleep apnea letter for the [place]. Pt has appointment tomorrow with PCP, but wanted to send a message back anyway. Placed sleep apnea letter in appointment notes.

## 2023-06-03 NOTE — Telephone Encounter (Signed)
Will wait for appt tomorrow.

## 2023-06-04 ENCOUNTER — Ambulatory Visit (INDEPENDENT_AMBULATORY_CARE_PROVIDER_SITE_OTHER): Payer: Medicare Other | Admitting: Family

## 2023-06-04 ENCOUNTER — Encounter: Payer: Self-pay | Admitting: Family

## 2023-06-04 VITALS — BP 138/67 | HR 69 | Temp 97.7°F | Resp 18 | Ht 66.0 in | Wt 263.5 lb

## 2023-06-04 DIAGNOSIS — K219 Gastro-esophageal reflux disease without esophagitis: Secondary | ICD-10-CM | POA: Diagnosis not present

## 2023-06-04 DIAGNOSIS — E088 Diabetes mellitus due to underlying condition with unspecified complications: Secondary | ICD-10-CM | POA: Diagnosis not present

## 2023-06-04 DIAGNOSIS — E782 Mixed hyperlipidemia: Secondary | ICD-10-CM

## 2023-06-04 DIAGNOSIS — I10 Essential (primary) hypertension: Secondary | ICD-10-CM | POA: Diagnosis not present

## 2023-06-04 DIAGNOSIS — E119 Type 2 diabetes mellitus without complications: Secondary | ICD-10-CM | POA: Diagnosis not present

## 2023-06-04 DIAGNOSIS — I77810 Thoracic aortic ectasia: Secondary | ICD-10-CM

## 2023-06-04 DIAGNOSIS — G4733 Obstructive sleep apnea (adult) (pediatric): Secondary | ICD-10-CM

## 2023-06-04 DIAGNOSIS — I509 Heart failure, unspecified: Secondary | ICD-10-CM | POA: Diagnosis not present

## 2023-06-04 MED ORDER — AMLODIPINE BESYLATE 5 MG PO TABS: 5.0000 mg | ORAL_TABLET | Freq: Every day | ORAL | 1 refills | Status: DC

## 2023-06-04 MED ORDER — FAMOTIDINE 20 MG PO TABS: 20.0000 mg | ORAL_TABLET | Freq: Two times a day (BID) | ORAL | 1 refills | Status: DC

## 2023-06-04 MED ORDER — ATORVASTATIN CALCIUM 80 MG PO TABS: 80.0000 mg | ORAL_TABLET | Freq: Every day | ORAL | 1 refills | Status: DC

## 2023-06-04 MED ORDER — CLOPIDOGREL BISULFATE 75 MG PO TABS: 75.0000 mg | ORAL_TABLET | Freq: Every day | ORAL | 1 refills | Status: DC

## 2023-06-04 NOTE — Assessment & Plan Note (Signed)
Borderline per 2022 2D echo

## 2023-06-04 NOTE — Progress Notes (Signed)
Subjective:     Patient ID: Steven Payne, male    DOB: November 30, 1946, 77 y.o.   MRN: [id number]  Chief Complaint  Patient presents with   Follow-up    Ne[facility] for [facility]    HPI  Discussed the use of AI scribe software for clinical note transcription with the patient, who gave verbal consent to proceed.  History of Present Illness   Steven Payne presents with a history of hypertension, hyperlipidemia, diabetes, plantar fasciitis, DM and sleep apnea, presents for a routine follow-up. Steven Payne reports that his blood pressure has been well-controlled with amlodipine, hydrochlorothiazide, and Entresto, and his cholesterol levels have been satisfactory on atorvastatin and Zetia. His diabetes is managed with metformin, with a recent A1c of 6.2.  The patient's primary concern is aching and pain in his feet, diagnosed as plantar fasciitis by a podiatrist. The pain has been severe enough to limit his physical activity, contributing to a recent weight gain. Steven Payne has tried using a frozen water bottle for relief, but found it unhelpful. Steven Payne also uses orthotics, which provide some relief.  Steven Payne also mentions sleep apnea, which Steven Payne manages with a CPAP machine. Steven Payne expresses a desire for a letter from the doctor stating that his sleep apnea is related to his obesity, for the purpose of a disability claim with the [place].  The patient also mentions a history of reflux, which is managed with occasional Pepcid and dietary modifications. Steven Payne notes that high-fiber foods seem to help, while fried foods exacerbate the condition.      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   ??   HERNIA REPAIR  1990   HYDROCELE EXCISION Left 09/10/2016   Procedure: HYDROCELECTOMY ADULT;  Surgeon: Ihor Gully, MD;  Location: [facility];  Service: Urology;  Laterality: Left;   UMBILICAL HERNIA REPAIR  1980's    Family History  Problem Relation Age of Onset   Coronary artery disease Father    Diabetes Father    Prostate cancer Father        died at 60   Alcohol abuse Father    Coronary artery disease Mother        died age 3   Alcohol abuse Mother    Cancer Mother        unsure, + thyroid cancer   Breast cancer Paternal Grandmother     Social History   Socioeconomic History   Marital status: Married    Spouse name: Not on file   Number of children: Not on file   Years of education: Not on file   Highest education level: Bachelor's degree (e.g., BA, AB, BS)  Occupational History   Not on file  Tobacco Use   Smoking status: Former    Years: 2    Types: Cigarettes    Quit date: 03/27/1991    Years since quitting: 32.2   Smokeless tobacco: Never   Tobacco comments:    light smoker (smoked on 2 cigarettes per day for almost 2 years)  Vaping Use   Vaping Use: Never used  Substance and Sexual Activity   Alcohol use: Yes    Alcohol/week: 1.0 standard drink of alcohol    Types: 1 Standard drinks or equivalent per week   Drug use: No   Sexual activity: Yes  Other Topics Concern   Not on file  Social History Narrative   Retired Electronics engineer, 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  No grandchildren.  Oldest in [place], youngest in [place]   Completed bachelors, some graduate (Actuary)   Enjoys travelling, walking, cruises   Social Determinants of Health   Financial Resource Strain: Low Risk  (06/03/2023)   Overall Financial Resource Strain (CARDIA)    Difficulty of Paying Living Expenses: Not hard at all  Food Insecurity: No Food Insecurity (06/03/2023)   Hunger Vital Sign    Worried About Running Out of Food in the Last Year: Never true    Ran Out of Food in the Last Year: Never true  Transportation  Needs: No Transportation Needs (06/03/2023)   PRAPARE - Administrator, Civil Service (Medical): No    Lack of Transportation (Non-Medical): No  Physical Activity: Sufficiently Active (06/03/2023)   Exercise Vital Sign    Days of Exercise per Week: 5 days    Minutes of Exercise per Session: 40 min  Recent Concern: Physical Activity - Insufficiently Active (03/18/2023)   Exercise Vital Sign    Days of Exercise per Week: 3 days    Minutes of Exercise per Session: 40 min  Stress: Stress Concern Present (06/03/2023)   [employer] of Occupational Health -  Occupational Stress Questionnaire    Feeling of Stress : Rather much  Social Connections: Socially Integrated (06/03/2023)   Social Connection and Isolation Panel [NHANES]    Frequency of Communication with Friends and Family: More than three times a week    Frequency of Social Gatherings with Friends and Family: More than three times a week    Attends Religious Services: More than 4 times per year    Active Member of [employer] or Organizations: No    Attends Engineer, structural: More than 4 times per year    Marital Status: Married  Catering manager Violence: Not At Risk (03/19/2023)   Humiliation, Afraid, Rape, and Kick questionnaire    Fear of Current or Ex-Partner: No    Emotionally Abused: No    Physically Abused: No    Sexually Abused: No    Outpatient Medications Prior to Visit  Medication Sig Dispense Refill   clobetasol cream (TEMOVATE) 0.05 % Apply 1 application topically daily.     colchicine 0.6 MG tablet Take 1 tablet (0.6 mg total) by mouth daily as needed (Gout). gout 20 tablet 0   ezetimibe (ZETIA) 10 MG tablet Take 1 tablet (10 mg total) by mouth daily. 90 tablet 2   hydrochlorothiazide (HYDRODIURIL) 25 MG tablet TAKE 1 TABLET DAILY. 90 tablet 3   Lancets (FREESTYLE) lancets TEST UP TO FOUR TIMES DAILY 300 each 3   metFORMIN (GLUCOPHAGE) 1000 MG tablet TAKE 1 TABLET TWICE A DAY WITH MEALS ( THIS SHOULD  REPLACE 500 MG TWICE A DAY ) 180 tablet 1   RESTASIS 0.05 % ophthalmic emulsion Place 1 drop into both eyes daily.     sacubitril-valsartan (ENTRESTO) 24-26 MG Take 1 tablet by mouth 2 (two) times daily. Needs appointment for future refills 180 tablet 3   sildenafil (VIAGRA) 100 MG tablet Take 1 tablet (100 mg total) by mouth daily as needed for erectile dysfunction. 10 tablet 11   Testosterone 1.62 % GEL Place 1 Pump onto the skin daily. 225 g 1   amLODipine (NORVASC) 5 MG tablet Take 1 tablet (5 mg total) by mouth daily. 90 tablet 1   atorvastatin (LIPITOR) 80 MG tablet Take 80 mg by mouth daily.     clopidogrel (PLAVIX) 75 MG tablet TAKE 1 TABLET DAILY 90 tablet 1   famotidine (PEPCID) 20 MG tablet TAKE 1 TABLET TWICE A DAY 180 tablet 1   No facility-administered medications prior to visit.    Allergies  Allergen Reactions   Albiglutide Other (See Comments) and Itching    constipation    ROS See HPI    Objective:    Physical Exam Constitutional:      General: Steven Payne is not in acute distress.    Appearance: Steven Payne is well-developed.  HENT:     Head: Normocephalic and atraumatic.  Cardiovascular:     Rate and Rhythm: Normal rate and regular rhythm.     Heart sounds: No murmur heard. Pulmonary:     Effort: Pulmonary effort is normal. No respiratory distress.     Breath sounds: Normal breath sounds. No wheezing or rales.  Skin:    General: Skin is warm and dry.  Neurological:     Mental Status: Steven Payne is alert and oriented to person, place, and time.  Psychiatric:        Behavior: Behavior normal.        Thought Content: Thought content normal.      BP 138/67   Pulse 69   Temp  97.7 F (36.5 C) (Oral)   Resp 18   Ht 5\' 6"  (1.676 m)   Wt 263 lb 8 oz (119.5 kg)   SpO2 96%   BMI 42.53 kg/m  Wt Readings from Last 3 Encounters:  06/04/23 263 lb 8 oz (119.5 kg)  03/18/23 247 lb (112 kg)  03/07/23 257 lb 0.6 oz (116.6 kg)       Assessment & Plan:   Problem List Items  Addressed This Visit       Unprioritized   OSA (obstructive sleep apnea)    Continues cpap, letter [facility].       Hyperlipidemia    Well controlled on Atorvastatin and Zetia. -Continue current medications.      Relevant Medications   atorvastatin (LIPITOR) 80 MG tablet   amLODipine (NORVASC) 5 MG tablet   HTN (hypertension) - Primary    Well controlled on Amlodipine 5mg , Hydrochlorothiazide, and Entresto. -Continue current medications.      Relevant Medications   atorvastatin (LIPITOR) 80 MG tablet   amLODipine (NORVASC) 5 MG tablet   Other Relevant Orders   Basic Metabolic Panel (BMET)   Heart failure, type unknown (HCC)   Relevant Medications   atorvastatin (LIPITOR) 80 MG tablet   amLODipine (NORVASC) 5 MG tablet   GERD (gastroesophageal reflux disease)     Occasional symptoms, related to diet. -Continue Pepcid as needed. -Encourage avoidance of trigger foods.      Relevant Medications   famotidine (PEPCID) 20 MG tablet   Diabetes type 2, controlled (HCC)   Relevant Medications   atorvastatin (LIPITOR) 80 MG tablet   Other Relevant Orders   HgB A1c   Diabetes mellitus due to underlying condition with unspecified complications (HCC)    Last A1C was 6.2. -Continue Metformin 1000mg  twice daily. -Repeat A1C today.      Relevant Medications   atorvastatin (LIPITOR) 80 MG tablet   Ascending aorta dilatation (HCC)    Borderline per 2022 2D echo      Relevant Medications   atorvastatin (LIPITOR) 80 MG tablet   amLODipine (NORVASC) 5 MG tablet   35 minutes spent on today's visit. Time was spent reviewing medical record, counseling Steven Payne on weight loss/exercise and formulating medical plan.  I have changed Greig Castilla B. Profeta "Andy"'s famotidine, clopidogrel, and atorvastatin. I am also having him maintain his freestyle, colchicine, clobetasol cream, Restasis, metFORMIN, hydrochlorothiazide, Testosterone, sildenafil, Entresto, ezetimibe, and amLODipine.  Meds  ordered this encounter  Medications   famotidine (PEPCID) 20 MG tablet    Sig: Take 1 tablet (20 mg total) by mouth 2 (two) times daily.    Dispense:  180 tablet    Refill:  1    Order Specific Question:   Supervising Provider    Answer:   Danise Edge A [[id number]]   clopidogrel (PLAVIX) 75 MG tablet    Sig: Take 1 tablet (75 mg total) by mouth daily.    Dispense:  90 tablet    Refill:  1    Order Specific Question:   Supervising Provider    Answer:   Danise Edge A [[id number]]   atorvastatin (LIPITOR) 80 MG tablet    Sig: Take 1 tablet (80 mg total) by mouth daily.    Dispense:  90 tablet    Refill:  1    Order Specific Question:   Supervising Provider    Answer:   Danise Edge A [[id number]]   amLODipine (NORVASC) 5 MG tablet    Sig: Take 1 tablet (  5 mg total) by mouth daily.    Dispense:  90 tablet    Refill:  1    Order Specific Question:   Supervising Provider    Answer:   Danise Edge A [[id number]]

## 2023-06-04 NOTE — Assessment & Plan Note (Signed)
Occasional symptoms, related to diet. -Continue Pepcid as needed. -Encourage avoidance of trigger foods.

## 2023-06-04 NOTE — Patient Instructions (Signed)
VISIT SUMMARY:  During your recent visit, we discussed your ongoing management of hypertension, hyperlipidemia, and diabetes, which are all well-controlled with your current medications. We also addressed your plantar fasciitis, which has been causing you significant foot pain and limiting your physical activity. Your sleep apnea is being managed with a CPAP machine, and we discussed the connection between this and your weight. We also touched on your occasional reflux symptoms, which you manage with Pepcid and dietary modifications.  YOUR PLAN:  -HYPERTENSION: Your high blood pressure is well controlled with your current medications. Please continue taking Amlodipine, Hydrochlorothiazide, and Entresto as prescribed.  -HYPERLIPIDEMIA: Your cholesterol levels are satisfactory on your current medications. Please continue taking Atorvastatin and Zetia as prescribed.  -TYPE 2 DIABETES MELLITUS: Your diabetes is well managed with Metformin. We will repeat your A1C test today to ensure it remains controlled.  -PLANTAR FASCIITIS: Your foot pain is due to plantar fasciitis. Continue using orthotics and icing for relief. Try non-weight bearing exercises like cycling to help manage the pain.  -OBESITY: Your recent weight gain is likely contributing to your plantar fasciitis and sleep apnea. Continue with dietary modifications and exercise as much as you can tolerate. [facility] that your sleep apnea is related to your obesity.  -GASTROESOPHAGEAL REFLUX DISEASE: Your occasional reflux symptoms are managed with Pepcid and dietary modifications. Continue this approach and avoid foods that trigger your symptoms.  -SLEEP APNEA: Your sleep apnea is being managed with a CPAP machine. Please continue using it as directed.  INSTRUCTIONS:  Your medication refills have been provided and lab orders have been placed. A summary of this visit will be provided via MyChart. Please continue  to monitor your symptoms and reach out if you have any concerns or questions.

## 2023-06-04 NOTE — Assessment & Plan Note (Signed)
Well controlled on Atorvastatin and Zetia. -Continue current medications.

## 2023-06-04 NOTE — Assessment & Plan Note (Signed)
Well controlled on Amlodipine 5mg , Hydrochlorothiazide, and Entresto. -Continue current medications.

## 2023-06-04 NOTE — Assessment & Plan Note (Signed)
Continues cpap, letter provided for [facility].

## 2023-06-04 NOTE — Assessment & Plan Note (Deleted)
Last A1C was 6.2. -Continue Metformin 1000mg  twice daily. -Repeat A1C today.

## 2023-06-04 NOTE — Assessment & Plan Note (Signed)
Last A1C was 6.2. -Continue Metformin 1000mg twice daily. -Repeat A1C today.  

## 2023-06-05 LAB — BASIC METABOLIC PANEL
BUN: 10 mg/dL (ref 6–23)
CO2: 27 mEq/L (ref 19–32)
Calcium: 9.7 mg/dL (ref 8.4–10.5)
Chloride: 99 mEq/L (ref 96–112)
Creatinine, Ser: 0.73 mg/dL (ref 0.40–1.50)
GFR: 87.09 mL/min (ref >60.00)
Glucose, Bld: 83 mg/dL (ref 70–99)
Potassium: 3.7 mEq/L (ref 3.5–5.1)
Sodium: 138 mEq/L (ref 135–145)

## 2023-06-05 LAB — HEMOGLOBIN A1C: Hgb A1c MFr Bld: 6.4 % (ref 4.6–6.5)

## 2023-07-02 ENCOUNTER — Ambulatory Visit (INDEPENDENT_AMBULATORY_CARE_PROVIDER_SITE_OTHER): Payer: Medicare Other | Admitting: Family

## 2023-07-02 ENCOUNTER — Telehealth: Payer: Self-pay | Admitting: Family

## 2023-07-02 VITALS — BP 139/69 | HR 58 | Temp 97.8°F | Resp 16 | Wt 259.0 lb

## 2023-07-02 DIAGNOSIS — R1010 Upper abdominal pain, unspecified: Secondary | ICD-10-CM | POA: Diagnosis not present

## 2023-07-02 LAB — COMPREHENSIVE METABOLIC PANEL
ALT: 5 U/L (ref 0–53)
AST: 11 U/L (ref 0–37)
Albumin: 4.1 g/dL (ref 3.5–5.2)
Alkaline Phosphatase: 72 U/L (ref 39–117)
BUN: 9 mg/dL (ref 6–23)
CO2: 28 mEq/L (ref 19–32)
Calcium: 9.3 mg/dL (ref 8.4–10.5)
Chloride: 104 mEq/L (ref 96–112)
Creatinine, Ser: 0.77 mg/dL (ref 0.40–1.50)
GFR: 85.66 mL/min (ref >60.00)
Glucose, Bld: 118 mg/dL — ABNORMAL HIGH (ref 70–99)
Potassium: 3.9 mEq/L (ref 3.5–5.1)
Sodium: 142 mEq/L (ref 135–145)
Total Bilirubin: 0.5 mg/dL (ref 0.2–1.2)
Total Protein: 6.7 g/dL (ref 6.0–8.3)

## 2023-07-02 LAB — CBC WITH DIFFERENTIAL/PLATELET
Basophils Absolute: 0 10*3/uL (ref 0.0–0.1)
Basophils Relative: 0.3 % (ref 0.0–3.0)
Eosinophils Absolute: 0.1 10*3/uL (ref 0.0–0.7)
Eosinophils Relative: 1.4 % (ref 0.0–5.0)
HCT: 42.3 % (ref 39.0–52.0)
Hemoglobin: 13.7 g/dL (ref 13.0–17.0)
Lymphocytes Relative: 39.6 % (ref 12.0–46.0)
Lymphs Abs: 2.2 10*3/uL (ref 0.7–4.0)
MCHC: 32.5 g/dL (ref 30.0–36.0)
MCV: 84.6 fl (ref 78.0–100.0)
Monocytes Absolute: 0.4 10*3/uL (ref 0.1–1.0)
Monocytes Relative: 7.9 % (ref 3.0–12.0)
Neutro Abs: 2.9 10*3/uL (ref 1.4–7.7)
Neutrophils Relative %: 50.8 % (ref 43.0–77.0)
Platelets: 313 10*3/uL (ref 150.0–400.0)
RBC: 5 Mil/uL (ref 4.22–5.81)
RDW: 15.5 % (ref 11.5–15.5)
WBC: 5.7 10*3/uL (ref 4.0–10.5)

## 2023-07-02 LAB — LIPASE: Lipase: 176 U/L — ABNORMAL HIGH (ref 11.0–59.0)

## 2023-07-02 NOTE — Progress Notes (Signed)
Subjective:     Patient ID: Steven Payne, male    DOB: February 26, 1946, 77 y.o.   MRN: [id number]  Chief Complaint  Patient presents with   Abdominal Pain    Complains of abdominal pain, bloating and constipation     HPI  Patient presents today with upper abdominal pain that started about a week ago, and is noted to radiate to bilateral sides with a sharp and pinching sensation. Patient states last bowel movement was 2 days ago and he normally has bowel movements twice a day. He states the pain has been occurring for about a week and a half now. Gas is present. Patient states that the pain is the same while sitting, lying and standing. States prune juice helps some. Patient states last abdominal surgery was a hernia surgery that was 50 years ago. Denies using his Colchince since has not had a flare up with gout for a while. Denies any new medications other then a new multi vitamin over the counter. Kaopectate does help patient with his abdominal pain.        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   ??   HERNIA REPAIR  1990   HYDROCELE EXCISION Left 09/10/2016   Procedure: HYDROCELECTOMY ADULT;  Surgeon: Ihor Gully, MD;  Location: [facility];  Service: Urology;  Laterality: Left;   UMBILICAL HERNIA REPAIR  1980's    Family History  Problem Relation Age of Onset  Coronary artery disease Father    Diabetes Father    Prostate cancer Father        died at 67   Alcohol abuse Father    Coronary artery disease Mother        died age 22   Alcohol abuse Mother    Cancer Mother        unsure, + thyroid cancer   Breast cancer Paternal Grandmother     Social History   Socioeconomic History   Marital status: Married    Spouse name: Not on file   Number of children: Not on file   Years of education: Not on file   Highest education level: Bachelor's degree (e.g., BA, AB, BS)  Occupational History   Not on  file  Tobacco Use   Smoking status: Former    Current packs/day: 0.00    Types: Cigarettes    Start date: 03/26/1989    Quit date: 03/27/1991    Years since quitting: 32.2   Smokeless tobacco: Never   Tobacco comments:    light smoker (smoked on 2 cigarettes per day for almost 2 years)  Vaping Use   Vaping status: Never Used  Substance and Sexual Activity   Alcohol use: Yes    Alcohol/week: 1.0 standard drink of alcohol    Types: 1 Standard drinks or equivalent per week   Drug use: No   Sexual activity: Yes  Other Topics Concern   Not on file  Social History Narrative   Retired Electronics engineer, 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  No grandchildren.  Oldest in [place], youngest in [place]   Completed bachelors, some graduate (Actuary)   Enjoys travelling, walking, cruises   Social Determinants of Health   Financial Resource Strain: Low Risk  (06/03/2023)   Overall Financial Resource Strain (CARDIA)    Difficulty of Paying Living Expenses: Not hard at all  Food Insecurity: No Food Insecurity (06/03/2023)   Hunger Vital Sign    Worried About Running Out of Food in the Last Year: Never true    Ran Out of Food in the Last Year: Never true  Transportation Needs: No Transportation Needs (06/03/2023)   PRAPARE - Administrator, Civil Service (Medical): No    Lack of Transportation (Non-Medical): No  Physical Activity: Sufficiently Active (06/03/2023)   Exercise Vital Sign    Days of Exercise per Week: 5 days    Minutes of Exercise per Session: 40 min  Recent Concern: Physical Activity - Insufficiently Active (03/18/2023)   Exercise Vital Sign    Days of Exercise per Week: 3 days    Minutes of Exercise per Session: 40 min  Stress: Stress Concern Present (06/03/2023)   [employer] of Occupational Health - Occupational Stress Questionnaire    Feeling of Stress : Rather much  Social  Connections: Socially Integrated (06/03/2023)   Social Connection and Isolation Panel [NHANES]    Frequency of Communication with Friends and Family: More than three times a week    Frequency of Social Gatherings with Friends and Family: More than three times a week    Attends Religious Services: More than 4 times per year    Active Member of [employer] or Organizations: No    Attends Engineer, structural: More than 4 times per year    Marital Status: Married  Catering manager Violence: Not At  Risk (03/19/2023)   Humiliation, Afraid, Rape, and Kick questionnaire    Fear of Current or Ex-Partner: No    Emotionally Abused: No    Physically Abused: No    Sexually Abused: No    Outpatient Medications Prior to Visit  Medication Sig Dispense Refill   amLODipine (NORVASC) 5 MG tablet Take 1 tablet (5 mg total) by mouth daily. 90 tablet 1   atorvastatin (LIPITOR) 80 MG tablet Take 1 tablet (80 mg total) by mouth daily. 90 tablet 1   clobetasol cream (TEMOVATE) 0.05 % Apply 1 application topically daily.     clopidogrel (PLAVIX) 75 MG tablet Take 1 tablet (75 mg total) by mouth daily. 90 tablet 1   colchicine 0.6 MG tablet Take 1 tablet (0.6 mg total) by mouth daily as needed (Gout). gout 20 tablet 0   ezetimibe (ZETIA) 10 MG tablet Take 1 tablet (10 mg total) by mouth daily. 90 tablet 2   famotidine (PEPCID) 20 MG tablet Take 1 tablet (20 mg total) by mouth 2 (two) times daily. 180 tablet 1   hydrochlorothiazide (HYDRODIURIL) 25 MG tablet TAKE 1 TABLET DAILY. 90 tablet 3   Lancets (FREESTYLE) lancets TEST UP TO FOUR TIMES DAILY 300 each 3   metFORMIN (GLUCOPHAGE) 1000 MG tablet TAKE 1 TABLET TWICE A DAY WITH MEALS ( THIS SHOULD REPLACE 500 MG TWICE A DAY ) 180 tablet 1   RESTASIS 0.05 % ophthalmic emulsion Place 1 drop into both eyes daily.     sacubitril-valsartan (ENTRESTO) 24-26 MG Take 1 tablet by mouth 2 (two) times daily. Needs appointment for future refills 180 tablet 3   sildenafil  (VIAGRA) 100 MG tablet Take 1 tablet (100 mg total) by mouth daily as needed for erectile dysfunction. 10 tablet 11   Testosterone 1.62 % GEL Place 1 Pump onto the skin daily. 225 g 1   No facility-administered medications prior to visit.    Allergies  Allergen Reactions   Albiglutide Other (See Comments) and Itching    constipation    ROS See HPI    Objective:    Physical Exam Constitutional:      General: He is not in acute distress.    Appearance: He is well-developed.  HENT:     Head: Normocephalic and atraumatic.  Cardiovascular:     Rate and Rhythm: Normal rate and regular rhythm.     Heart sounds: No murmur heard. Pulmonary:     Effort: Pulmonary effort is normal. No respiratory distress.     Breath sounds: Normal breath sounds. No wheezing or rales.  Abdominal:     General: Bowel sounds are increased.     Tenderness: There is no abdominal tenderness. There is no guarding (mild abdominal distension).     Hernia: No hernia is present.  Skin:    General: Skin is warm and dry.  Neurological:     Mental Status: He is alert and oriented to person, place, and time.  Psychiatric:        Behavior: Behavior normal.        Thought Content: Thought content normal.     BP 139/69 (BP Location: Right Arm, Patient Position: Sitting, Cuff Size: Large)   Pulse (!) 58   Temp 97.8 F (36.6 C) (Oral)   Resp 16   Wt 259 lb (117.5 kg)   SpO2 100%   BMI 41.80 kg/m  Wt Readings from Last 3 Encounters:  07/02/23 259 lb (117.5 kg)  06/04/23 263 lb 8 oz (119.5  kg)  03/18/23 247 lb (112 kg)       Assessment & Plan:    Problem List Items Addressed This Visit       Unprioritized   Pain of upper abdomen - Primary    Pain for almost 2 weeks. Bowel sounds are active. Checking labs as below.  Differential includes pancreatitis, constipation. Suggested prune juice or miralax to help move bowels to see if this helps with the pain. If symptoms worsen he is advised to go to the  ED.  If symptoms do not improve with bowel movement he is to let me know.      Relevant Orders   CBC w/Diff   Lipase   Comp Met (CMET)    I am having Greig Castilla B. Pablo "Mardelle Matte" maintain his freestyle, colchicine, clobetasol cream, Restasis, metFORMIN, hydrochlorothiazide, Testosterone, sildenafil, Entresto, ezetimibe, famotidine, clopidogrel, atorvastatin, and amLODipine.  No orders of the defined types were placed in this encounter.

## 2023-07-02 NOTE — Telephone Encounter (Signed)
Patient advised of results and information noted by provider, he verbalized understanding. Was scheduled to return in one week

## 2023-07-02 NOTE — Telephone Encounter (Signed)
Please advise pt that his lab work shows that he had pancreatitis (inflammation of pancreas).  I would recommend that he switch to a clear liquid diet for the next 1-2 days (broth, jello, apple juice).  Then if feeling better change to a full liquid diet (milk, soup etc.) for another few days. Then if improved may advance diet as tolerated. Avoid alcohol.  Go to ER if pain worsens.  Otherwise, I would like to see him back in the office in 1 week.

## 2023-07-02 NOTE — Assessment & Plan Note (Addendum)
Pain for almost 2 weeks. Bowel sounds are active. Checking labs as below.  Differential includes pancreatitis, constipation. Suggested prune juice or miralax to help move bowels to see if this helps with the pain. If symptoms worsen he is advised to go to the ED.  If symptoms do not improve with bowel movement he is to let me know.

## 2023-07-02 NOTE — Patient Instructions (Signed)
Please go to the ER if pain worsens or if you develop nausea/vomiting. Try miralax or prune juice to help move your bowels.  If this does not help your abdominal discomfort.

## 2023-07-09 ENCOUNTER — Ambulatory Visit (INDEPENDENT_AMBULATORY_CARE_PROVIDER_SITE_OTHER): Payer: Medicare Other | Admitting: Family

## 2023-07-09 VITALS — BP 136/69 | HR 60 | Temp 98.0°F | Resp 16 | Wt 255.0 lb

## 2023-07-09 DIAGNOSIS — K5900 Constipation, unspecified: Secondary | ICD-10-CM | POA: Diagnosis not present

## 2023-07-09 DIAGNOSIS — K219 Gastro-esophageal reflux disease without esophagitis: Secondary | ICD-10-CM | POA: Diagnosis not present

## 2023-07-09 DIAGNOSIS — E782 Mixed hyperlipidemia: Secondary | ICD-10-CM

## 2023-07-09 DIAGNOSIS — K859 Acute pancreatitis without necrosis or infection, unspecified: Secondary | ICD-10-CM | POA: Diagnosis not present

## 2023-07-09 DIAGNOSIS — I10 Essential (primary) hypertension: Secondary | ICD-10-CM

## 2023-07-09 LAB — CBC WITH DIFFERENTIAL/PLATELET
Basophils Absolute: 0 10*3/uL (ref 0.0–0.1)
Basophils Relative: 0.2 % (ref 0.0–3.0)
Eosinophils Absolute: 0.1 10*3/uL (ref 0.0–0.7)
Eosinophils Relative: 1.8 % (ref 0.0–5.0)
HCT: 43.2 % (ref 39.0–52.0)
Hemoglobin: 14.1 g/dL (ref 13.0–17.0)
Lymphocytes Relative: 41 % (ref 12.0–46.0)
Lymphs Abs: 1.9 10*3/uL (ref 0.7–4.0)
MCHC: 32.5 g/dL (ref 30.0–36.0)
MCV: 84.5 fl (ref 78.0–100.0)
Monocytes Absolute: 0.4 10*3/uL (ref 0.1–1.0)
Monocytes Relative: 8.2 % (ref 3.0–12.0)
Neutro Abs: 2.3 10*3/uL (ref 1.4–7.7)
Neutrophils Relative %: 48.8 % (ref 43.0–77.0)
Platelets: 306 10*3/uL (ref 150.0–400.0)
RBC: 5.11 Mil/uL (ref 4.22–5.81)
RDW: 15.1 % (ref 11.5–15.5)
WBC: 4.7 10*3/uL (ref 4.0–10.5)

## 2023-07-09 LAB — HEPATIC FUNCTION PANEL
ALT: 5 U/L (ref 0–53)
AST: 14 U/L (ref 0–37)
Albumin: 4.3 g/dL (ref 3.5–5.2)
Alkaline Phosphatase: 76 U/L (ref 39–117)
Bilirubin, Direct: 0.2 mg/dL (ref 0.0–0.3)
Total Bilirubin: 0.7 mg/dL (ref 0.2–1.2)
Total Protein: 7.1 g/dL (ref 6.0–8.3)

## 2023-07-09 LAB — LIPASE: Lipase: 279 U/L — ABNORMAL HIGH (ref 11.0–59.0)

## 2023-07-09 NOTE — Assessment & Plan Note (Signed)
Clinically improving, advance diet as tolerated update labs to ensure lipase is trending downwards.

## 2023-07-09 NOTE — Progress Notes (Signed)
Subjective:     Patient ID: Steven Payne, male    DOB: 20-Apr-1946, 77 y.o.   MRN: [id number]  Chief Complaint  Patient presents with   Pancreatitis    Here for follow up    HPI  Discussed the use of AI scribe software for clinical note transcription with the patient, who gave verbal consent to proceed.  77 year old male coming onto office today for follow up on pancreatitis. Patient states that he just finished his liquid diet and that he is feeling a lot better. No more abdominal pain noted from patient. Patient states that his only issue that he is having is that he is still having constipation issues. He states he has not have a bowel movement in almost two days. Patient states that he has not started the miralax that was suggested to him last office visit due to him just finishing his liquid diet as ordered.       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   no significant cad, sluggish ectatic coronary artery flow (rca, lad, lcx), normal LVSF, ef 60%   CATARACT EXTRACTION  11/30/2021   HEMORROIDECTOMY  1985 ?   ??   HERNIA REPAIR  1990   HYDROCELE EXCISION Left 09/10/2016   Procedure: HYDROCELECTOMY ADULT;  Surgeon: Ihor Gully, MD;  Location: [facility];  Service: Urology;  Laterality: Left;   UMBILICAL HERNIA REPAIR  1980's    Family History  Problem Relation Age of Onset   Coronary artery disease Father    Diabetes Father  Prostate cancer Father        died at 60   Alcohol abuse Father    Coronary artery disease Mother        died age 20   Alcohol abuse Mother    Cancer Mother        unsure, + thyroid cancer   Breast cancer Paternal Grandmother     Social History   Socioeconomic History   Marital status: Married    Spouse name: Not on file   Number of children: Not on file   Years of education: Not on file   Highest education level: Bachelor's degree (e.g., BA, AB, BS)  Occupational History   Not on file  Tobacco Use   Smoking status: Former    Current packs/day: 0.00     Types: Cigarettes    Start date: 03/26/1989    Quit date: 03/27/1991    Years since quitting: 32.3   Smokeless tobacco: Never   Tobacco comments:    light smoker (smoked on 2 cigarettes per day for almost 2 years)  Vaping Use   Vaping status: Never Used  Substance and Sexual Activity   Alcohol use: Yes    Alcohol/week: 1.0 standard drink of alcohol    Types: 1 Standard drinks or equivalent per week   Drug use: No   Sexual activity: Yes  Other Topics Concern   Not on file  Social History Narrative   Retired Electronics engineer, 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  No grandchildren.  Oldest in [place], youngest in [place]   Completed bachelors, some graduate (Actuary)   Enjoys travelling, walking, cruises   Social Determinants of Health   Financial Resource Strain: Low Risk  (06/03/2023)   Overall Financial Resource Strain (CARDIA)    Difficulty of Paying Living Expenses: Not hard at all  Food Insecurity: No Food Insecurity (06/03/2023)   Hunger Vital Sign    Worried About Running Out of Food in the Last Year: Never true    Ran Out of Food in the Last Year: Never true  Transportation Needs: No Transportation Needs (06/03/2023)   PRAPARE - Administrator, Civil Service (Medical): No    Lack of Transportation (Non-Medical): No  Physical Activity: Sufficiently Active (06/03/2023)   Exercise Vital Sign    Days of Exercise per Week: 5 days    Minutes of Exercise per Session: 40 min  Recent Concern: Physical Activity - Insufficiently Active (03/18/2023)   Exercise Vital Sign    Days of Exercise per Week: 3 days    Minutes of Exercise per Session: 40 min  Stress: Stress Concern Present (06/03/2023)   [employer] of Occupational Health - Occupational Stress Questionnaire    Feeling of Stress : Rather much  Social Connections: Socially Integrated (06/03/2023)   Social Connection and  Isolation Panel [NHANES]    Frequency of Communication with Friends and Family: More than three times a week    Frequency of Social Gatherings with Friends and Family: More than three times a week    Attends Religious Services: More than 4 times per year    Active Member of [employer] or Organizations: No    Attends Engineer, structural: More than 4 times per year    Marital Status: Married  Catering manager Violence: Not At Risk (03/19/2023)   Humiliation, Afraid, Rape, and Kick questionnaire  Fear of Current or Ex-Partner: No    Emotionally Abused: No    Physically Abused: No    Sexually Abused: No    Outpatient Medications Prior to Visit  Medication Sig Dispense Refill   amLODipine (NORVASC) 5 MG tablet Take 1 tablet (5 mg total) by mouth daily. 90 tablet 1   atorvastatin (LIPITOR) 80 MG tablet Take 1 tablet (80 mg total) by mouth daily. 90 tablet 1   clobetasol cream (TEMOVATE) 0.05 % Apply 1 application topically daily.     clopidogrel (PLAVIX) 75 MG tablet Take 1 tablet (75 mg total) by mouth daily. 90 tablet 1   colchicine 0.6 MG tablet Take 1 tablet (0.6 mg total) by mouth daily as needed (Gout). gout 20 tablet 0   ezetimibe (ZETIA) 10 MG tablet Take 1 tablet (10 mg total) by mouth daily. 90 tablet 2   famotidine (PEPCID) 20 MG tablet Take 1 tablet (20 mg total) by mouth 2 (two) times daily. 180 tablet 1   hydrochlorothiazide (HYDRODIURIL) 25 MG tablet TAKE 1 TABLET DAILY. 90 tablet 3   Lancets (FREESTYLE) lancets TEST UP TO FOUR TIMES DAILY 300 each 3   metFORMIN (GLUCOPHAGE) 1000 MG tablet TAKE 1 TABLET TWICE A DAY WITH MEALS ( THIS SHOULD REPLACE 500 MG TWICE A DAY ) 180 tablet 1   RESTASIS 0.05 % ophthalmic emulsion Place 1 drop into both eyes daily.     sacubitril-valsartan (ENTRESTO) 24-26 MG Take 1 tablet by mouth 2 (two) times daily. Needs appointment for future refills 180 tablet 3   sildenafil (VIAGRA) 100 MG tablet Take 1 tablet (100 mg total) by mouth daily as  needed for erectile dysfunction. 10 tablet 11   Testosterone 1.62 % GEL Place 1 Pump onto the skin daily. 225 g 1   No facility-administered medications prior to visit.    Allergies  Allergen Reactions   Albiglutide Other (See Comments) and Itching    constipation    Review of Systems  Constitutional:  Negative for chills and fever.  HENT:  Negative for congestion, ear discharge, ear pain, nosebleeds, sinus pain and sore throat.   Eyes:  Negative for blurred vision, double vision, pain and discharge.  Respiratory:  Negative for cough, hemoptysis and shortness of breath.   Cardiovascular:  Negative for chest pain, palpitations and claudication.  Gastrointestinal:  Negative for abdominal pain, heartburn, nausea and vomiting.  Genitourinary:  Negative for frequency and urgency.  Musculoskeletal:  Negative for back pain and joint pain.  Neurological:  Negative for dizziness, tingling, speech change and headaches.  Psychiatric/Behavioral:  Negative for depression.        Objective:    Physical Exam Constitutional:      Appearance: Normal appearance.  HENT:     Head: Normocephalic.     Nose: Nose normal.  Cardiovascular:     Rate and Rhythm: Normal rate and regular rhythm.     Pulses: Normal pulses.     Heart sounds: Normal heart sounds.  Pulmonary:     Effort: Pulmonary effort is normal.     Breath sounds: Normal breath sounds.  Abdominal:     General: Abdomen is flat.     Palpations: Abdomen is soft.     Tenderness: There is no abdominal tenderness.  Musculoskeletal:        General: Normal range of motion.     Cervical back: Normal range of motion.  Skin:    General: Skin is warm.  Neurological:     General:  No focal deficit present.     Mental Status: He is alert. Mental status is at baseline.      BP 136/69 (BP Location: Right Arm, Patient Position: Sitting, Cuff Size: Large)   Pulse 60   Temp 98 F (36.7 C) (Oral)   Resp 16   Wt 255 lb (115.7 kg)   SpO2  100%   BMI 41.16 kg/m  Wt Readings from Last 3 Encounters:  07/09/23 255 lb (115.7 kg)  07/02/23 259 lb (117.5 kg)  06/04/23 263 lb 8 oz (119.5 kg)       Assessment & Plan:   Problem List Items Addressed This Visit       Unprioritized   Hyperlipidemia    Continue current medications       HTN (hypertension)    Continue current medication. Blood pressure is stable       GERD (gastroesophageal reflux disease)    Continue with current medication. No issues noted with GERD symptoms.       Constipation      Start miralax to help with your constipation issues that you are having. If you are stilling having issues with constipation please call the office back.       Acute pancreatitis - Primary    Clinically improving, advance diet as tolerated update labs to ensure lipase is trending downwards.      Relevant Orders   Lipase   Hepatic function panel   CBC w/Diff    I am having Greig Castilla B. Dubiel "Mardelle Matte" maintain his freestyle, colchicine, clobetasol cream, Restasis, metFORMIN, hydrochlorothiazide, Testosterone, sildenafil, Entresto, ezetimibe, famotidine, clopidogrel, atorvastatin, and amLODipine.  No orders of the defined types were placed in this encounter.

## 2023-07-09 NOTE — Assessment & Plan Note (Signed)
   Start miralax to help with your constipation issues that you are having. If you are stilling having issues with constipation please call the office back.

## 2023-07-09 NOTE — Assessment & Plan Note (Signed)
Continue with current medication. No issues noted with GERD symptoms.

## 2023-07-09 NOTE — Assessment & Plan Note (Signed)
Continue current medication. Blood pressure is stable

## 2023-07-09 NOTE — Assessment & Plan Note (Signed)
Continue current medications. 

## 2023-07-10 ENCOUNTER — Telehealth: Payer: Self-pay | Admitting: Family

## 2023-07-10 DIAGNOSIS — K859 Acute pancreatitis without necrosis or infection, unspecified: Secondary | ICD-10-CM

## 2023-07-10 NOTE — Telephone Encounter (Signed)
Suprisingly, his lipase (pancreatic enzyme) has actually risen.  I would like to refer him to gastroenterology and also obtain a CT abd to further evaluation.

## 2023-07-10 NOTE — Telephone Encounter (Signed)
Patient notified of result and plan of care. He agrees and verbalized understanding

## 2023-07-10 NOTE — Telephone Encounter (Signed)
Called patient but no answer, left voice mail for patient to call back.   

## 2023-07-12 ENCOUNTER — Ambulatory Visit (HOSPITAL_BASED_OUTPATIENT_CLINIC_OR_DEPARTMENT_OTHER)
Admission: RE | Admit: 2023-07-12 | Discharge: 2023-07-12 | Disposition: A | Discharge status: Home / Self Care | Payer: Medicare Other | Source: Ambulatory Visit | Attending: Family | Admitting: Family

## 2023-07-12 DIAGNOSIS — K5750 Diverticulosis of both small and large intestine without perforation or abscess without bleeding: Secondary | ICD-10-CM | POA: Diagnosis not present

## 2023-07-12 DIAGNOSIS — E119 Type 2 diabetes mellitus without complications: Secondary | ICD-10-CM | POA: Diagnosis not present

## 2023-07-12 DIAGNOSIS — N281 Cyst of kidney, acquired: Secondary | ICD-10-CM | POA: Diagnosis not present

## 2023-07-12 DIAGNOSIS — H04123 Dry eye syndrome of bilateral lacrimal glands: Secondary | ICD-10-CM | POA: Diagnosis not present

## 2023-07-12 DIAGNOSIS — K859 Acute pancreatitis without necrosis or infection, unspecified: Secondary | ICD-10-CM | POA: Diagnosis not present

## 2023-07-12 DIAGNOSIS — H40041 Steroid responder, right eye: Secondary | ICD-10-CM | POA: Diagnosis not present

## 2023-07-12 DIAGNOSIS — Z961 Presence of intraocular lens: Secondary | ICD-10-CM | POA: Diagnosis not present

## 2023-07-12 LAB — HM DIABETES EYE EXAM

## 2023-07-12 MED ORDER — IOHEXOL 300 MG/ML  SOLN
100.0000 mL | Freq: Once | INTRAMUSCULAR | Status: AC | PRN
  Administered 2023-07-12: 100 mL via INTRAVENOUS

## 2023-07-14 ENCOUNTER — Telehealth: Payer: Self-pay | Admitting: Family

## 2023-07-14 DIAGNOSIS — R748 Abnormal levels of other serum enzymes: Secondary | ICD-10-CM

## 2023-07-14 NOTE — Telephone Encounter (Signed)
Please advise pt that his pancreas looks normal on CT.  I would like to repeat lipase in 1 week, dx pancreatitis.  If still elevated, will refer him to gastroenterology.  There was some slowing in the small bowel- only an issue if he has nausea/vomiting, or new abdominal pain- let us know if this occurs/

## 2023-07-15 NOTE — Telephone Encounter (Signed)
Patient notified of this test results, and scheduled to come in for Lipase in one week.

## 2023-07-22 ENCOUNTER — Other Ambulatory Visit (INDEPENDENT_AMBULATORY_CARE_PROVIDER_SITE_OTHER): Payer: Medicare Other

## 2023-07-22 DIAGNOSIS — R748 Abnormal levels of other serum enzymes: Secondary | ICD-10-CM | POA: Diagnosis not present

## 2023-07-22 LAB — LIPASE: Lipase: 110 U/L — ABNORMAL HIGH (ref 11.0–59.0)

## 2023-07-23 ENCOUNTER — Telehealth: Payer: Self-pay | Admitting: Family

## 2023-07-23 NOTE — Telephone Encounter (Signed)
Lipase is coming down but still elevated. I would still like for him to see GI. Referral in the system. Please give him number to call.

## 2023-07-23 NOTE — Telephone Encounter (Signed)
Called and spoke with Steven Payne. Notified of results and provided with number to schedule appointment. J.J.

## 2023-07-29 DIAGNOSIS — R748 Abnormal levels of other serum enzymes: Secondary | ICD-10-CM | POA: Diagnosis not present

## 2023-07-29 DIAGNOSIS — K5900 Constipation, unspecified: Secondary | ICD-10-CM | POA: Diagnosis not present

## 2023-07-29 DIAGNOSIS — K859 Acute pancreatitis without necrosis or infection, unspecified: Secondary | ICD-10-CM | POA: Diagnosis not present

## 2023-07-29 DIAGNOSIS — R140 Abdominal distension (gaseous): Secondary | ICD-10-CM | POA: Diagnosis not present

## 2023-08-16 ENCOUNTER — Other Ambulatory Visit (HOSPITAL_BASED_OUTPATIENT_CLINIC_OR_DEPARTMENT_OTHER): Payer: Self-pay

## 2023-08-16 DIAGNOSIS — Z23 Encounter for immunization: Secondary | ICD-10-CM | POA: Diagnosis not present

## 2023-08-16 MED ORDER — FLUAD 0.5 ML IM SUSY
0.5000 mL | PREFILLED_SYRINGE | Freq: Once | INTRAMUSCULAR | 0 refills | Status: AC
  Filled 2023-08-16: qty 0.5, 1d supply, fill #0

## 2023-08-16 MED ORDER — COMIRNATY 30 MCG/0.3ML IM SUSY
0.3000 mL | PREFILLED_SYRINGE | Freq: Once | INTRAMUSCULAR | 0 refills | Status: AC
  Filled 2023-08-16: qty 0.3, 1d supply, fill #0

## 2023-08-22 ENCOUNTER — Encounter: Payer: Self-pay | Admitting: Urology

## 2023-08-22 ENCOUNTER — Ambulatory Visit (INDEPENDENT_AMBULATORY_CARE_PROVIDER_SITE_OTHER): Payer: Medicare Other | Admitting: Urology

## 2023-08-22 ENCOUNTER — Telehealth: Payer: Self-pay | Admitting: Family

## 2023-08-22 VITALS — BP 142/87 | HR 57 | Ht 67.0 in | Wt 250.0 lb

## 2023-08-22 DIAGNOSIS — E291 Testicular hypofunction: Secondary | ICD-10-CM | POA: Diagnosis not present

## 2023-08-22 DIAGNOSIS — N529 Male erectile dysfunction, unspecified: Secondary | ICD-10-CM | POA: Diagnosis not present

## 2023-08-22 DIAGNOSIS — N138 Other obstructive and reflux uropathy: Secondary | ICD-10-CM | POA: Diagnosis not present

## 2023-08-22 DIAGNOSIS — N401 Enlarged prostate with lower urinary tract symptoms: Secondary | ICD-10-CM

## 2023-08-22 MED ORDER — TESTOSTERONE 1.62 % TD GEL
1.0000 | Freq: Every day | TRANSDERMAL | 1 refills | Status: DC
Start: 2023-08-22 — End: 2024-02-19

## 2023-08-22 NOTE — Progress Notes (Signed)
Assessment: 1. Hypogonadism male   2. Organic impotence   3. BPH with obstruction/lower urinary tract symptoms     Plan: Labs today:  testosterone, CBC Continue Androgel 1.62% 1 pump every day.  Rx sent to [facility] He is not interested in any treatment for his LUTS at this time. Return to office in 6 months  Chief Complaint:  Chief Complaint  Patient presents with   Hypogonadism    History of Present Illness:  Steven Payne is a 77 y.o. male who is seen for continued evaluation of hypogonadism. He was previously followed by Dr. Benancio Deeds at [facility].  He initially presented with decreased energy and decreased muscle mass as well as erectile dysfunction.  His testosterone was found to be 89.  He was prescribed topical testosterone replacement therapy.  He noted significant improvement in his symptoms with therapy.  He was using 1 pump daily of AndroGel.  He was last seen by Dr. Benancio Deeds in 2/22.  He was subsequently seen by Dr. Sabino Gasser at [facility] in April 2023.  At that time, he was using AndroGel 1 pump every other day and Cialis 20 mg as needed for erectile dysfunction. Labs from 4/23: PSA 0.6 H&H 14.4/43.8 Testosterone 167  He had not used AndroGel for approximately 5 months as his prescription expired and he had been unable to get a refill.  He reported a return of his hypogonadal symptoms with decreased energy, erectile dysfunction, and difficulty losing weight.  He also reported a decrease in his libido.  He continued to have problems with erectile dysfunction.  He had not used Cialis in some time.  This was previously working well for him.  He reported some lower urinary tract symptoms including frequency, decreased force of stream, and nocturia x 2.   No dysuria or gross hematuria.  His symptoms are not particularly bothersome for him. IPSS = 20.  He was restarted on Androgel 1.62% in March 2024. Labs from  4/24: Testosterone 207 H&H  14.4/43.1 PSA  0.9  He returns today for follow-up.  He continues on AndroGel 1.62% 1 pump daily.  He feels like his symptoms are stable at the present time.  He continues with lower urinary tract symptoms including frequency, decreased stream, nocturia x 3-4.  No dysuria or gross hematuria. IPSS = 23. He has a prescription for sildenafil but is not using this on a regular basis.  Portions of the above documentation were copied from a prior visit for review purposes only.   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   ??   HERNIA REPAIR  1990   HYDROCELE EXCISION Left 09/10/2016   Procedure: HYDROCELECTOMY ADULT;  Surgeon: Ihor Gully, MD;  Location: [facility];  Service: Urology;  Laterality: Left;   UMBILICAL HERNIA REPAIR  1980's    Allergies:  Allergies  Allergen Reactions   Albiglutide Other (See Comments) and Itching    constipation    Family History:  Family History  Problem Relation Age of Onset   Coronary artery disease Father    Diabetes Father    Prostate cancer Father        died at 25   Alcohol abuse Father    Coronary artery disease Mother        died age 9   Alcohol abuse Mother    Cancer Mother        unsure, + thyroid cancer   Breast cancer Paternal Grandmother     Social History:  Social History   Tobacco Use   Smoking status: Former    Current packs/day: 0.00    Types: Cigarettes    Start date: 03/26/1989    Quit date: 03/27/1991    Years since quitting: 32.4   Smokeless tobacco: Never   Tobacco comments:    light smoker (smoked on 2 cigarettes per day for almost 2 years)  Vaping Use   Vaping status: Never Used  Substance Use Topics   Alcohol use: Yes    Alcohol/week: 1.0 standard drink of alcohol    Types: 1 Standard drinks or equivalent per week   Drug use: No    ROS: Constitutional:  Negative for fever, chills, weight loss CV: Negative for chest pain, previous MI, hypertension Respiratory:   Negative for shortness of breath, wheezing, sleep apnea, frequent cough GI:  Negative for nausea, vomiting, bloody stool, GERD  Physical exam: BP (!) 153/83   Pulse (!) 58   Ht 5\' 7"  (1.702 m)   Wt 250 lb (113.4 kg)   BMI 39.16 kg/m  GENERAL APPEARANCE:  Well appearing, well developed, well nourished, NAD HEENT:  Atraumatic, normocephalic, oropharynx clear NECK:  Supple without lymphadenopathy or thyromegaly ABDOMEN:  Soft, non-tender, no masses EXTREMITIES:  Moves all extremities well, without clubbing, cyanosis, or edema NEUROLOGIC:  Alert and oriented x 3, normal gait, CN II-XII grossly intact MENTAL STATUS:  appropriate BACK:  Non-tender to palpation, No CVAT SKIN:  Warm, dry, and intact  Results: None

## 2023-08-22 NOTE — Telephone Encounter (Signed)
-----   Message from Ely[place] Revankar sent at 08/22/2023  2:09 PM EDT ----- As long as he has not had any intervention of the coronary arteries in the past 12 months, he should be fine.  Thank you check with GI to see if this can be replaced with aspirin. ----- Message ----- From: Sandford Craze, NP Sent: 08/22/2023   2:03 PM EDT To: Garwin Brothers, MD  Hello,  Just wanted to double check with you- ok to hold plavix for 5 days prior to planned EGD?   Thanks,  [employer]

## 2023-08-22 NOTE — Addendum Note (Signed)
Addended by: Lizbeth Bark on: 08/22/2023 09:30 AM   Modules accepted: Orders

## 2023-08-23 LAB — TESTOSTERONE: Testosterone: 141 ng/dL — ABNORMAL LOW (ref 264–916)

## 2023-08-25 ENCOUNTER — Other Ambulatory Visit: Payer: Self-pay | Admitting: Family

## 2023-08-25 DIAGNOSIS — E119 Type 2 diabetes mellitus without complications: Secondary | ICD-10-CM

## 2023-08-26 ENCOUNTER — Encounter: Payer: Self-pay | Admitting: Urology

## 2023-08-28 DIAGNOSIS — R748 Abnormal levels of other serum enzymes: Secondary | ICD-10-CM | POA: Diagnosis not present

## 2023-08-28 DIAGNOSIS — K2970 Gastritis, unspecified, without bleeding: Secondary | ICD-10-CM | POA: Diagnosis not present

## 2023-08-28 DIAGNOSIS — K3189 Other diseases of stomach and duodenum: Secondary | ICD-10-CM | POA: Diagnosis not present

## 2023-08-28 DIAGNOSIS — K862 Cyst of pancreas: Secondary | ICD-10-CM | POA: Diagnosis not present

## 2023-08-28 DIAGNOSIS — K838 Other specified diseases of biliary tract: Secondary | ICD-10-CM | POA: Diagnosis not present

## 2023-09-25 DIAGNOSIS — K8020 Calculus of gallbladder without cholecystitis without obstruction: Secondary | ICD-10-CM | POA: Diagnosis not present

## 2023-10-07 ENCOUNTER — Telehealth: Payer: Self-pay | Admitting: Family

## 2023-10-07 ENCOUNTER — Ambulatory Visit (INDEPENDENT_AMBULATORY_CARE_PROVIDER_SITE_OTHER): Payer: Medicare Other | Admitting: Family

## 2023-10-07 VITALS — BP 133/76 | HR 62 | Temp 98.7°F | Resp 16 | Ht 67.0 in | Wt 254.0 lb

## 2023-10-07 DIAGNOSIS — E782 Mixed hyperlipidemia: Secondary | ICD-10-CM

## 2023-10-07 DIAGNOSIS — F32A Depression, unspecified: Secondary | ICD-10-CM | POA: Insufficient documentation

## 2023-10-07 DIAGNOSIS — N529 Male erectile dysfunction, unspecified: Secondary | ICD-10-CM | POA: Diagnosis not present

## 2023-10-07 DIAGNOSIS — H9313 Tinnitus, bilateral: Secondary | ICD-10-CM | POA: Diagnosis not present

## 2023-10-07 DIAGNOSIS — I10 Essential (primary) hypertension: Secondary | ICD-10-CM

## 2023-10-07 DIAGNOSIS — E1142 Type 2 diabetes mellitus with diabetic polyneuropathy: Secondary | ICD-10-CM | POA: Diagnosis not present

## 2023-10-07 DIAGNOSIS — G4733 Obstructive sleep apnea (adult) (pediatric): Secondary | ICD-10-CM | POA: Diagnosis not present

## 2023-10-07 DIAGNOSIS — Z8719 Personal history of other diseases of the digestive system: Secondary | ICD-10-CM | POA: Diagnosis not present

## 2023-10-07 DIAGNOSIS — K859 Acute pancreatitis without necrosis or infection, unspecified: Secondary | ICD-10-CM

## 2023-10-07 DIAGNOSIS — E291 Testicular hypofunction: Secondary | ICD-10-CM | POA: Diagnosis not present

## 2023-10-07 DIAGNOSIS — I2510 Atherosclerotic heart disease of native coronary artery without angina pectoris: Secondary | ICD-10-CM | POA: Diagnosis not present

## 2023-10-07 DIAGNOSIS — E119 Type 2 diabetes mellitus without complications: Secondary | ICD-10-CM

## 2023-10-07 DIAGNOSIS — M109 Gout, unspecified: Secondary | ICD-10-CM

## 2023-10-07 LAB — COMPREHENSIVE METABOLIC PANEL
ALT: 6 U/L (ref 0–53)
AST: 14 U/L (ref 0–37)
Albumin: 4.1 g/dL (ref 3.5–5.2)
Alkaline Phosphatase: 65 U/L (ref 39–117)
BUN: 6 mg/dL (ref 6–23)
CO2: 26 meq/L (ref 19–32)
Calcium: 8.9 mg/dL (ref 8.4–10.5)
Chloride: 105 meq/L (ref 96–112)
Creatinine, Ser: 0.71 mg/dL (ref 0.40–1.50)
GFR: 87.62 mL/min (ref >60.00)
Glucose, Bld: 90 mg/dL (ref 70–99)
Potassium: 4.3 meq/L (ref 3.5–5.1)
Sodium: 141 meq/L (ref 135–145)
Total Bilirubin: 0.7 mg/dL (ref 0.2–1.2)
Total Protein: 6.7 g/dL (ref 6.0–8.3)

## 2023-10-07 LAB — HEMOGLOBIN A1C: Hgb A1c MFr Bld: 6.1 % (ref 4.6–6.5)

## 2023-10-07 LAB — LIPASE: Lipase: 139 U/L — ABNORMAL HIGH (ref 11.0–59.0)

## 2023-10-07 MED ORDER — GABAPENTIN 300 MG PO CAPS: 300.0000 mg | ORAL_CAPSULE | Freq: Every day | ORAL | 1 refills | Status: DC

## 2023-10-07 NOTE — Assessment & Plan Note (Signed)
Uncontrolled and affecting his sleep and mood. Will rx with gabapentin 300mg  HS.

## 2023-10-07 NOTE — Assessment & Plan Note (Signed)
Lab Results  Component Value Date   CHOL 133 02/06/2023   HDL 46.00 02/06/2023   LDLCALC 69 02/06/2023   TRIG 89.0 02/06/2023   CHOLHDL 3 02/06/2023   Stable on lipitor.  Continue same.

## 2023-10-07 NOTE — Assessment & Plan Note (Signed)
BP Readings from Last 3 Encounters:  10/07/23 133/76  08/22/23 (!) 142/87  07/09/23 136/69   BP at goal.  Maintained on amlodipine, hydrochlorothiazide.

## 2023-10-07 NOTE — Assessment & Plan Note (Signed)
Reports good compliance with CPAP 

## 2023-10-07 NOTE — Telephone Encounter (Signed)
Please call [facility] and request a copy of DM eye exam.

## 2023-10-07 NOTE — Assessment & Plan Note (Signed)
Being managed by Dr. Pete Glatter, with topical testosterone.

## 2023-10-07 NOTE — Assessment & Plan Note (Signed)
He has not found the viagra to be helpful.  He does not wish to pursue further treatment at this time.

## 2023-10-07 NOTE — Patient Instructions (Signed)
VISIT SUMMARY:  During your visit today, we discussed your ongoing mental health concerns, sleep disturbances, chronic foot pain, ear ringing, and gastrointestinal issues. We reviewed your current medications and made some adjustments to better manage your symptoms. We also discussed general health maintenance and ensured that you are up to date with your vaccinations and routine lab work.  YOUR PLAN:  -ANXIETY AND DEPRESSION: Anxiety and depression are mental health conditions that can cause feelings of worry, sadness, and other emotional distress. You will continue taking sertraline as prescribed by your [facility] doctor, and we discussed the possibility of starting therapy. I provided you with a pamphlet for [facility].  -PERIPHERAL NEUROPATHY: Peripheral neuropathy is a condition that causes chronic pain in the nerves, often in the feet. We will start you on gabapentin 300mg  at bedtime to help manage your nerve pain and improve your sleep. We will reevaluate in a few weeks to see if the dosage needs adjustment.  -TINNITUS: Tinnitus is a condition characterized by ringing or noise in the ears. The [facility] will provide you with hearing aids that include sound therapy to help manage this condition.  -GASTROINTESTINAL ISSUES: You reported excessive gas without abdominal pain. I advised you to continue following a low gas diet to help manage this symptom.  -GENERAL HEALTH MAINTENANCE: For your general health, continue using your CPAP machine for sleep apnea, and take testosterone as prescribed by Dr. Corbin Ade. Keep taking your Jardiance/metformin combo for diabetes management and use eye drops for dry eyes. Your flu, RSV, and COVID vaccines are up to date. We will check your Bypace level and complete lab work today.  INSTRUCTIONS:  Please follow up in three months for a routine check-up. We will also reevaluate your gabapentin dosage at that time. Make sure to complete your lab work today.

## 2023-10-07 NOTE — Assessment & Plan Note (Signed)
Lab Results  Component Value Date   HGBA1C 6.4 06/04/2023   HGBA1C 6.2 02/06/2023   HGBA1C 6.3 08/29/2022   Lab Results  Component Value Date   MICROALBUR <0.7 11/07/2022   LDLCALC 69 02/06/2023   CREATININE 0.77 07/02/2023

## 2023-10-07 NOTE — Assessment & Plan Note (Signed)
No recent symptoms

## 2023-10-07 NOTE — Assessment & Plan Note (Signed)
Being [facility] by [facility] with [facility]ine. Plans to start counseling.

## 2023-10-07 NOTE — Telephone Encounter (Signed)
Electronic request made 

## 2023-10-07 NOTE — Assessment & Plan Note (Signed)
Clinically resolved. Will update Lipase.

## 2023-10-07 NOTE — Assessment & Plan Note (Signed)
Wt Readings from Last 3 Encounters:  10/07/23 254 lb (115.2 kg)  08/22/23 250 lb (113.4 kg)  07/09/23 255 lb (115.7 kg)

## 2023-10-07 NOTE — Assessment & Plan Note (Signed)
Clinically resolved.  

## 2023-10-07 NOTE — Assessment & Plan Note (Signed)
>>  ASSESSMENT AND PLAN FOR GOUT WRITTEN ON 10/07/2023  7:26 AM BY O'SULLIVAN, Hobson Lax, NP  No recent symptoms.

## 2023-10-07 NOTE — Assessment & Plan Note (Signed)
He is following with audiology at the [facility].

## 2023-10-07 NOTE — Progress Notes (Signed)
Subjective:     Patient ID: Steven Payne, male    DOB: 12-29-1945, 77 y.o.   MRN: [id number]  Chief Complaint  Patient presents with   Diabetes    Here for follow up   Hypertension    Here for follow up    HPI  Discussed the use of AI scribe software for clinical note transcription with the patient, who gave verbal consent to proceed.  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 The patient reports ongoing mental health concerns, including mild depression and anxiety, which are currently being managed by [facility] doctors with sertraline. The patient mentions sleep disturbances, which are causing significant distress and impacting his marital life. The patient also reports chronic foot pain, which is present day and night, and seems to worsen when lying down. The patient has previously been on gabapentin for this pain, but is currently not taking it. The patient also mentions a long-standing issue of ear ringing, which has recently become more bothersome.  [facility] The patient also reports excessive gas, but denies any abdominal pain. The patient is currently using a CPAP machine for sleep apnea and is on testosterone. The patient denies any recent gout flare-ups and reports that his pancreatitis has been managed well.          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   ??   HERNIA REPAIR  1990   HYDROCELE EXCISION Left 09/10/2016   Procedure: HYDROCELECTOMY ADULT;  Surgeon: Ihor Gully, MD;  Location: [facility];  Service: Urology;  Laterality: Left;   UMBILICAL HERNIA REPAIR  1980's    Family History  Problem Relation Age of Onset   Coronary artery disease Father     Diabetes Father    Prostate cancer Father        died at 4   Alcohol abuse Father    Coronary artery disease Mother        died age 64   Alcohol abuse Mother    Cancer Mother        unsure, + thyroid cancer   Breast cancer Paternal Grandmother     Social History   Socioeconomic History   Marital status: Married    Spouse name: Not on file   Number of children: Not on file   Years of education: Not on file   Highest education level: Bachelor's degree (e.g., BA, AB, BS)  Occupational History   Not on file  Tobacco Use   Smoking status: Former    Current packs/day: 0.00    Types: Cigarettes    Start date: 03/26/1989    Quit date: 03/27/1991    Years since quitting: 32.5   Smokeless tobacco: Never   Tobacco comments:    light smoker (smoked on 2 cigarettes per day for almost 2 years)  Vaping Use   Vaping status: Never Used  Substance and Sexual Activity   Alcohol use: Yes    Alcohol/week: 1.0 standard drink of alcohol    Types: 1 Standard drinks or equivalent per week   Drug use: No   Sexual activity: Yes  Other Topics Concern   Not on file  Social History Narrative   Retired Electronics engineer, 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  No grandchildren.  Oldest in [place], youngest in [place]   Completed bachelors, some graduate (Actuary)   Enjoys travelling, walking, cruises   Social Determinants of Health   Financial Resource Strain: Medium Risk (10/03/2023)   Overall Financial Resource Strain (CARDIA)    Difficulty of Paying Living Expenses: Somewhat hard  Food Insecurity: Food Insecurity Present (10/03/2023)   Hunger Vital Sign    Worried About Running Out of Food in the Last Year: Sometimes true    Ran Out of Food in the Last Year: Patient declined  Transportation Needs: No Transportation Needs (10/03/2023)   PRAPARE - Administrator, Civil Service (Medical): No     Lack of Transportation (Non-Medical): No  Physical Activity: Insufficiently Active (10/03/2023)   Exercise Vital Sign    Days of Exercise per Week: 3 days    Minutes of Exercise per Session: 30 min  Stress: Stress Concern Present (10/03/2023)   [employer] of Occupational Health - Occupational Stress Questionnaire    Feeling of Stress : Very much  Social Connections: Moderately Integrated (10/03/2023)   Social Connection and Isolation Panel [NHANES]    Frequency of Communication with Friends and Family: Once a week    Frequency of Social Gatherings with Friends and Family: Never    Attends Religious Services: More than 4 times per year  Active Member of Clubs or Organizations: Yes    Attends Banker Meetings: More than 4 times per year    Marital Status: Married  Catering manager Violence: Not At Risk (07/26/2023)   Received from [facility]   HITS    Over the last 12 months how often did your partner physically hurt you?: 1    Over the last 12 months how often did your partner insult you or talk down to you?: 1    Over the last 12 months how often did your partner threaten you with physical harm?: 1    Over the last 12 months how often did your partner scream or curse at you?: 1    Outpatient Medications Prior to Visit  Medication Sig Dispense Refill   amLODipine (NORVASC) 5 MG tablet Take 1 tablet (5 mg total) by mouth daily. 90 tablet 1   atorvastatin (LIPITOR) 80 MG tablet Take 1 tablet (80 mg total) by mouth daily. 90 tablet 1   clobetasol cream (TEMOVATE) 0.05 % Apply 1 application topically daily.     clopidogrel (PLAVIX) 75 MG tablet Take 1 tablet (75 mg total) by mouth daily. 90 tablet 1   colchicine 0.6 MG tablet Take 1 tablet (0.6 mg total) by mouth daily as needed (Gout). gout 20 tablet 0   Empagliflozin-metFORMIN HCl ER 04-999 MG TB24 Take by mouth.     ezetimibe (ZETIA) 10 MG tablet Take 1 tablet (10 mg total) by mouth daily. 90 tablet 2    famotidine (PEPCID) 20 MG tablet Take 1 tablet (20 mg total) by mouth 2 (two) times daily. 180 tablet 1   hydrochlorothiazide (HYDRODIURIL) 25 MG tablet TAKE 1 TABLET DAILY. 90 tablet 3   Lancets (FREESTYLE) lancets TEST UP TO FOUR TIMES DAILY 300 each 3   RESTASIS 0.05 % ophthalmic emulsion Place 1 drop into both eyes daily.     sacubitril-valsartan (ENTRESTO) 24-26 MG Take 1 tablet by mouth 2 (two) times daily. Needs appointment for future refills 180 tablet 3   sertraline (ZOLOFT) 100 MG tablet Take by mouth.     sildenafil (VIAGRA) 100 MG tablet Take 1 tablet (100 mg total) by mouth daily as needed for erectile dysfunction. 10 tablet 11   Testosterone 1.62 % GEL Place 1 Pump onto the skin daily. 225 g 1   metFORMIN (GLUCOPHAGE) 1000 MG tablet TAKE 1 TABLET TWICE A DAY WITH MEALS 180 tablet 1   No facility-administered medications prior to visit.    Allergies  Allergen Reactions   Albiglutide Other (See Comments) and Itching    constipation    ROS See HPI    Objective:    Physical Exam Constitutional:      General: He is not in acute distress.    Appearance: He is well-developed.  HENT:     Head: Normocephalic and atraumatic.  Cardiovascular:     Rate and Rhythm: Normal rate and regular rhythm.     Heart sounds: No murmur heard. Pulmonary:     Effort: Pulmonary effort is normal. No respiratory distress.     Breath sounds: Normal breath sounds. No wheezing or rales.  Skin:    General: Skin is warm and dry.  Neurological:     Mental Status: He is alert and oriented to person, place, and time.  Psychiatric:        Behavior: Behavior normal.        Thought Content: Thought content normal.      BP 133/76  Pulse 62   Temp 98.7 F (37.1 C) (Oral)   Resp 16   Ht 5\' 7"  (1.702 m)   Wt 254 lb (115.2 kg)   SpO2 99%   BMI 39.78 kg/m  Wt Readings from Last 3 Encounters:  10/07/23 254 lb (115.2 kg)  08/22/23 250 lb (113.4 kg)  07/09/23 255 lb (115.7 kg)        Assessment & Plan:   Problem List Items Addressed This Visit       Unprioritized   Tinnitus of both ears - Primary    He is following with audiology at the [facility].      OSA (obstructive sleep apnea)    Reports good compliance with CPAP.        Organic impotence    He has not found the viagra to be helpful.  He does not wish to pursue further treatment at this time.       Morbid obesity (HCC)    Wt Readings from Last 3 Encounters:  10/07/23 254 lb (115.2 kg)  08/22/23 250 lb (113.4 kg)  07/09/23 255 lb (115.7 kg)         Relevant Medications   Empagliflozin-metFORMIN HCl ER 04-999 MG TB24   Hypogonadism male    Being managed by Dr. Pete Glatter, with topical testosterone.       Hyperlipidemia    Lab Results  Component Value Date   CHOL 133 02/06/2023   HDL 46.00 02/06/2023   LDLCALC 69 02/06/2023   TRIG 89.0 02/06/2023   CHOLHDL 3 02/06/2023   Stable on lipitor.  Continue same.        HTN (hypertension)    BP Readings from Last 3 Encounters:  10/07/23 133/76  08/22/23 (!) 142/87  07/09/23 136/69   BP at goal.  Maintained on amlodipine, hydrochlorothiazide.       History of pancreatitis    Clinically resolved. Will update Lipase.       Gout    No recent symptoms.       Diabetic polyneuropathy associated with type 2 diabetes mellitus (HCC)    Uncontrolled and affecting his sleep and mood. Will rx with gabapentin 300mg  HS.        Relevant Medications   Empagliflozin-metFORMIN HCl ER 04-999 MG TB24   sertraline (ZOLOFT) 100 MG tablet   gabapentin (NEURONTIN) 300 MG capsule   Diabetes type 2, controlled (HCC)    Lab Results  Component Value Date   HGBA1C 6.4 06/04/2023   HGBA1C 6.2 02/06/2023   HGBA1C 6.3 08/29/2022   Lab Results  Component Value Date   MICROALBUR <0.7 11/07/2022   LDLCALC 69 02/06/2023   CREATININE 0.77 07/02/2023         Relevant Medications   Empagliflozin-metFORMIN HCl ER 04-999 MG TB24   Other Relevant Orders   HgB  A1c   Comp Met (CMET)   Depression    [facility]. Plans to start counseling.       Relevant Medications   sertraline (ZOLOFT) 100 MG tablet   Coronary artery disease    He is seeing cardiology annually.       RESOLVED: Acute pancreatitis    Clinically resolved.       Relevant Orders   Lipase    I have discontinued Greig Castilla B. Knoch "Andy"'s metFORMIN. I am also having him start on gabapentin. Additionally, I am having him maintain his freestyle, colchicine, clobetasol cream, Restasis, hydrochlorothiazide, sildenafil, Entresto, ezetimibe, famotidine, clopidogrel, atorvastatin, amLODipine,  Testosterone, Empagliflozin-metFORMIN HCl ER, and sertraline.  Meds ordered this encounter  Medications   gabapentin (NEURONTIN) 300 MG capsule    Sig: Take 1 capsule (300 mg total) by mouth at bedtime.    Dispense:  90 capsule    Refill:  1    Order Specific Question:   Supervising Provider    Answer:   Danise Edge A [[id number]]

## 2023-10-07 NOTE — Assessment & Plan Note (Signed)
He is seeing cardiology annually.

## 2023-10-08 ENCOUNTER — Telehealth: Payer: Self-pay | Admitting: Family

## 2023-10-08 NOTE — Telephone Encounter (Signed)
Please send a copy of pt's lab work to his gastroenterologist.  Berniece Pap at [facility] in [place].

## 2023-10-09 NOTE — Telephone Encounter (Signed)
Labs faxed to [facility] at [phone]

## 2024-01-01 DIAGNOSIS — R000 Tachycardia, unspecified: Secondary | ICD-10-CM | POA: Diagnosis not present

## 2024-01-01 DIAGNOSIS — J101 Influenza due to other identified influenza virus with other respiratory manifestations: Secondary | ICD-10-CM | POA: Diagnosis not present

## 2024-01-01 DIAGNOSIS — Z20822 Contact with and (suspected) exposure to covid-19: Secondary | ICD-10-CM | POA: Diagnosis not present

## 2024-01-01 DIAGNOSIS — R509 Fever, unspecified: Secondary | ICD-10-CM | POA: Diagnosis not present

## 2024-01-01 DIAGNOSIS — R531 Weakness: Secondary | ICD-10-CM | POA: Diagnosis not present

## 2024-01-01 DIAGNOSIS — R0689 Other abnormalities of breathing: Secondary | ICD-10-CM | POA: Diagnosis not present

## 2024-01-02 DIAGNOSIS — I252 Old myocardial infarction: Secondary | ICD-10-CM | POA: Diagnosis not present

## 2024-01-02 DIAGNOSIS — R000 Tachycardia, unspecified: Secondary | ICD-10-CM | POA: Diagnosis not present

## 2024-01-08 ENCOUNTER — Ambulatory Visit: Payer: Medicare Other | Admitting: Family

## 2024-01-15 ENCOUNTER — Telehealth: Payer: Self-pay | Admitting: Family

## 2024-01-15 ENCOUNTER — Ambulatory Visit: Payer: Medicare Other | Admitting: Family

## 2024-01-15 ENCOUNTER — Encounter: Payer: Self-pay | Admitting: Family

## 2024-01-15 VITALS — BP 159/73 | HR 59 | Temp 97.7°F | Resp 16 | Ht 67.0 in | Wt 243.0 lb

## 2024-01-15 DIAGNOSIS — E782 Mixed hyperlipidemia: Secondary | ICD-10-CM

## 2024-01-15 DIAGNOSIS — Z8739 Personal history of other diseases of the musculoskeletal system and connective tissue: Secondary | ICD-10-CM | POA: Diagnosis not present

## 2024-01-15 DIAGNOSIS — I10 Essential (primary) hypertension: Secondary | ICD-10-CM | POA: Diagnosis not present

## 2024-01-15 DIAGNOSIS — E1142 Type 2 diabetes mellitus with diabetic polyneuropathy: Secondary | ICD-10-CM | POA: Diagnosis not present

## 2024-01-15 DIAGNOSIS — E088 Diabetes mellitus due to underlying condition with unspecified complications: Secondary | ICD-10-CM

## 2024-01-15 DIAGNOSIS — F32A Depression, unspecified: Secondary | ICD-10-CM | POA: Diagnosis not present

## 2024-01-15 DIAGNOSIS — K219 Gastro-esophageal reflux disease without esophagitis: Secondary | ICD-10-CM

## 2024-01-15 DIAGNOSIS — Z8719 Personal history of other diseases of the digestive system: Secondary | ICD-10-CM

## 2024-01-15 DIAGNOSIS — N401 Enlarged prostate with lower urinary tract symptoms: Secondary | ICD-10-CM

## 2024-01-15 DIAGNOSIS — I77810 Thoracic aortic ectasia: Secondary | ICD-10-CM | POA: Diagnosis not present

## 2024-01-15 DIAGNOSIS — N138 Other obstructive and reflux uropathy: Secondary | ICD-10-CM

## 2024-01-15 DIAGNOSIS — E291 Testicular hypofunction: Secondary | ICD-10-CM

## 2024-01-15 LAB — COMPREHENSIVE METABOLIC PANEL
ALT: 5 U/L (ref 0–53)
AST: 11 U/L (ref 0–37)
Albumin: 4.1 g/dL (ref 3.5–5.2)
Alkaline Phosphatase: 77 U/L (ref 39–117)
BUN: 9 mg/dL (ref 6–23)
CO2: 27 meq/L (ref 19–32)
Calcium: 8.8 mg/dL (ref 8.4–10.5)
Chloride: 103 meq/L (ref 96–112)
Creatinine, Ser: 0.62 mg/dL (ref 0.40–1.50)
GFR: 91.1 mL/min (ref >60.00)
Glucose, Bld: 94 mg/dL (ref 70–99)
Potassium: 4.3 meq/L (ref 3.5–5.1)
Sodium: 139 meq/L (ref 135–145)
Total Bilirubin: 0.8 mg/dL (ref 0.2–1.2)
Total Protein: 6.9 g/dL (ref 6.0–8.3)

## 2024-01-15 LAB — LIPID PANEL
Cholesterol: 163 mg/dL (ref 0–200)
HDL: 42.9 mg/dL (ref >39.00)
LDL Cholesterol: 94 mg/dL (ref 0–99)
NonHDL: 119.76
Total CHOL/HDL Ratio: 4
Triglycerides: 130 mg/dL (ref 0.0–149.0)
VLDL: 26 mg/dL (ref 0.0–40.0)

## 2024-01-15 LAB — HEMOGLOBIN A1C: Hgb A1c MFr Bld: 6.1 % (ref 4.6–6.5)

## 2024-01-15 LAB — LIPASE: Lipase: 37 U/L (ref 11.0–59.0)

## 2024-01-15 NOTE — Assessment & Plan Note (Signed)
Feeling calmer on sertraline.

## 2024-01-15 NOTE — Assessment & Plan Note (Signed)
Lab Results  Component Value Date   HGBA1C 6.1 10/07/2023   HGBA1C 6.4 06/04/2023   HGBA1C 6.2 02/06/2023   Lab Results  Component Value Date   MICROALBUR <0.7 11/07/2022   LDLCALC 69 02/06/2023   CREATININE 0.71 10/07/2023  A1C stable.  Diet controlled.

## 2024-01-15 NOTE — Assessment & Plan Note (Addendum)
BP Readings from Last 3 Encounters:  01/15/24 (!) 159/73  10/07/23 133/76  08/22/23 (!) 142/87   Reports sbp 120 this am at home. Advised pt to recheck bp this evening and send me his home reading via mychart. If elevated at home, consider increase amlodipine dose.

## 2024-01-15 NOTE — Assessment & Plan Note (Signed)
He is being treated by Dr. Pete Glatter, Urology with topical testosterone therapy.

## 2024-01-15 NOTE — Progress Notes (Signed)
Subjective:     Patient ID: Steven Payne, male    DOB: February 20, 1946, 78 y.o.   MRN: [id number]  Chief Complaint  Patient presents with   Hypertension    Here for follow up    HPI  Discussed the use of AI scribe software for clinical note transcription with the patient, who gave verbal consent to proceed.  History of Present Illness   VENCIL BASNETT "Mardelle Matte" is a 78 year old male with hypertension and sleep apnea who presents for management of CPAP and blood pressure monitoring.  His blood pressure was slightly elevated during today's visit. He monitors his blood pressure daily and notes it is usually around 'one twenty something over eighty something' in the mornings. He is currently taking amlodipine, hydrochlorothiazide, and Entresto for his blood pressure and cardiology management.   He experiences digestive issues, which have been calming down. He was advised by a surgeon that removing his gallbladder would not make a difference in his symptoms, so he opted not to proceed with surgery. He uses Pepcid (famotidine) as needed for digestive issues and is trying to manage his diet by reducing red meat and dairy intake.  He is taking Zoloft (sertraline) for mood stabilization, which has calmed him down significantly. He also takes gabapentin at bedtime for burning in his feet and to aid sleep.  He has a history of gout, which has not been problematic recently.          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   ??   HERNIA REPAIR  1990   HYDROCELE EXCISION Left 09/10/2016   Procedure: HYDROCELECTOMY ADULT;  Surgeon: Ihor Gully, MD;  Location: [facility];  Service: Urology;  Laterality: Left;   UMBILICAL HERNIA REPAIR  1980's    Family History  Problem Relation Age of Onset   Coronary artery disease Father    Diabetes Father    Prostate cancer Father         died at 20   Alcohol abuse Father    Coronary artery disease Mother        died age 37   Alcohol abuse Mother    Cancer Mother        unsure, + thyroid cancer   Breast cancer Paternal Grandmother     Social History   Socioeconomic History   Marital status: Married    Spouse name: Not on file   Number of children: Not on file   Years of education: Not on file   Highest education level: Bachelor's degree (e.g., BA, AB, BS)  Occupational History   Not on file  Tobacco Use   Smoking status: Former    Current packs/day: 0.00    Types: Cigarettes    Start date: 03/26/1989    Quit date: 03/27/1991    Years since quitting: 32.8   Smokeless tobacco: Never   Tobacco comments:    light smoker (smoked on 2 cigarettes per day for almost 2 years)  Vaping Use   Vaping status: Never Used  Substance and Sexual Activity   Alcohol use: Yes    Alcohol/week: 1.0 standard drink of alcohol    Types: 1 Standard drinks or equivalent per week   Drug use: No   Sexual activity: Yes  Other Topics Concern   Not on file  Social History Narrative   Retired Electronics engineer, 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  No grandchildren.  Oldest in [place], youngest in [place]   Completed bachelors, some graduate (Actuary)   Enjoys travelling, walking, cruises   Social Drivers of Corporate investment banker Strain: Low Risk  (01/14/2024)   Overall Financial Resource Strain (CARDIA)    Difficulty of Paying Living Expenses: Not hard at all  Food Insecurity: Food Insecurity Present (01/14/2024)   Hunger Vital Sign    Worried About Running Out of Food in the Last Year: Sometimes true    Ran Out of Food in the Last Year: Sometimes true  Transportation Needs: No Transportation Needs (01/14/2024)   PRAPARE - Administrator, Civil Service (Medical): No    Lack of Transportation (Non-Medical): No  Physical  Activity: Insufficiently Active (01/14/2024)   Exercise Vital Sign    Days of Exercise per Week: 4 days    Minutes of Exercise per Session: 30 min  Stress: Stress Concern Present (01/14/2024)   [employer] of Occupational Health - Occupational Stress Questionnaire    Feeling of Stress : Rather much  Social Connections: Socially Integrated (01/14/2024)   Social Connection and Isolation Panel [NHANES]    Frequency of Communication with Friends and Family: Three times a week    Frequency of Social Gatherings with Friends and Family: Once a week    Attends Religious Services: More than 4 times per  year    Active Member of Clubs or Organizations: Yes    Attends Banker Meetings: More than 4 times per year    Marital Status: Married  Catering manager Violence: Not At Risk (07/26/2023)   Received from [facility]   HITS    Over the last 12 months how often did your partner physically hurt you?: Never    Over the last 12 months how often did your partner insult you or talk down to you?: Never    Over the last 12 months how often did your partner threaten you with physical harm?: Never    Over the last 12 months how often did your partner scream or curse at you?: Never    Outpatient Medications Prior to Visit  Medication Sig Dispense Refill   amLODipine (NORVASC) 5 MG tablet Take 1 tablet (5 mg total) by mouth daily. 90 tablet 1   atorvastatin (LIPITOR) 80 MG tablet Take 1 tablet (80 mg total) by mouth daily. 90 tablet 1   clobetasol cream (TEMOVATE) 0.05 % Apply 1 application topically daily.     clopidogrel (PLAVIX) 75 MG tablet Take 1 tablet (75 mg total) by mouth daily. 90 tablet 1   colchicine 0.6 MG tablet Take 1 tablet (0.6 mg total) by mouth daily as needed (Gout). gout 20 tablet 0   Empagliflozin-metFORMIN HCl ER 04-999 MG TB24 Take by mouth.     ezetimibe (ZETIA) 10 MG tablet Take 1 tablet (10 mg total) by mouth daily. 90 tablet 2   famotidine (PEPCID) 20 MG tablet  Take 1 tablet (20 mg total) by mouth 2 (two) times daily. 180 tablet 1   gabapentin (NEURONTIN) 300 MG capsule Take 1 capsule (300 mg total) by mouth at bedtime. 90 capsule 1   hydrochlorothiazide (HYDRODIURIL) 25 MG tablet TAKE 1 TABLET DAILY. 90 tablet 3   Lancets (FREESTYLE) lancets TEST UP TO FOUR TIMES DAILY 300 each 3   RESTASIS 0.05 % ophthalmic emulsion Place 1 drop into both eyes daily.     sacubitril-valsartan (ENTRESTO) 24-26 MG Take 1 tablet by mouth 2 (two) times daily. Needs appointment for future refills 180 tablet 3   sertraline (ZOLOFT) 100 MG tablet Take by mouth.     sildenafil (VIAGRA) 100 MG tablet Take 1 tablet (100 mg total) by mouth daily as needed for erectile dysfunction. 10 tablet 11   Testosterone 1.62 % GEL Place 1 Pump onto the skin daily. 225 g 1   No facility-administered medications prior to visit.    Allergies  Allergen Reactions   Albiglutide Other (See Comments) and Itching    constipation    ROS See HPI    Objective:    Physical Exam Constitutional:      General: He is not in acute distress.    Appearance: He is well-developed.  HENT:     Head: Normocephalic and atraumatic.  Cardiovascular:     Rate and Rhythm: Normal rate and regular rhythm.     Heart sounds: No murmur heard. Pulmonary:     Effort: Pulmonary effort is normal. No respiratory distress.     Breath sounds: Normal breath sounds. No wheezing or rales.  Skin:    General: Skin is warm and dry.  Neurological:     Mental Status: He is alert and oriented to person, place, and time.  Psychiatric:        Behavior: Behavior normal.        Thought Content: Thought content normal.  BP (!) 159/73   Pulse (!) 59   Temp 97.7 F (36.5 C) (Oral)   Resp 16   Ht 5\' 7"  (1.702 m)   Wt 243 lb (110.2 kg)   SpO2 99%   BMI 38.06 kg/m  Wt Readings from Last 3 Encounters:  01/15/24 243 lb (110.2 kg)  10/07/23 254 lb (115.2 kg)  08/22/23 250 lb (113.4 kg)       Assessment &  Plan:   Problem List Items Addressed This Visit       Unprioritized   Morbid obesity (HCC)   Discussed importance of weight loss.       Hypogonadism male   He is being treated by Dr. Pete Glatter, Urology with topical testosterone therapy.       Hyperlipidemia   Lab Results  Component Value Date   CHOL 133 02/06/2023   HDL 46.00 02/06/2023   LDLCALC 69 02/06/2023   TRIG 89.0 02/06/2023   CHOLHDL 3 02/06/2023   Stable on lipitor and zetia. Continue same. Will update lipid panel.       Relevant Orders   Lipid panel   HTN (hypertension) - Primary   BP Readings from Last 3 Encounters:  01/15/24 (!) 159/73  10/07/23 133/76  08/22/23 (!) 142/87   Reports sbp 120 this am at home. Advised pt to recheck bp this evening and send me his home reading via mychart. If elevated at home, consider increase amlodipine dose.       Relevant Orders   Comp Met (CMET)   History of pancreatitis   No current GI symptoms.       Relevant Orders   Lipase   History of gout   No recent gout symptoms.       GERD (gastroesophageal reflux disease)   Stable. Using pepcid prn.        Diabetic polyneuropathy associated with type 2 diabetes mellitus (HCC)   Stable with HS gabapentin.       Diabetes mellitus due to underlying condition with unspecified complications [facility])   Lab Results  Component Value Date   HGBA1C 6.1 10/07/2023   HGBA1C 6.4 06/04/2023   HGBA1C 6.2 02/06/2023   Lab Results  Component Value Date   MICROALBUR <0.7 11/07/2022   LDLCALC 69 02/06/2023   CREATININE 0.71 10/07/2023  A1C stable.  Diet controlled.        Relevant Orders   HgB A1c   Urine Microalbumin w/creat. ratio   Comp Met (CMET)   Depression   Feeling calmer on sertraline.        BPH with obstruction/lower urinary tract symptoms   Reports no urinary issues currently.        Ascending aorta dilatation (HCC)   CT performed 2023 noted no evidence of thoracic aortic aneurysm or dissection.        I am having Greig Castilla B. Werth "Mardelle Matte" maintain his freestyle, colchicine, clobetasol cream, Restasis, hydrochlorothiazide, sildenafil, Entresto, ezetimibe, famotidine, clopidogrel, atorvastatin, amLODipine, Testosterone, Empagliflozin-metFORMIN HCl ER, sertraline, and gabapentin.  No orders of the defined types were placed in this encounter.

## 2024-01-15 NOTE — Assessment & Plan Note (Addendum)
Lab Results  Component Value Date   CHOL 133 02/06/2023   HDL 46.00 02/06/2023   LDLCALC 69 02/06/2023   TRIG 89.0 02/06/2023   CHOLHDL 3 02/06/2023   Stable on lipitor and zetia. Continue same. Will update lipid panel.

## 2024-01-15 NOTE — Patient Instructions (Signed)
VISIT SUMMARY:  Today, we discussed the management of your CPAP machine, blood pressure, and other health concerns. We reviewed your current medications and made plans for follow-up tests and consultations.  YOUR PLAN:  -HYPERTENSION: Hypertension means high blood pressure. Your blood pressure was slightly elevated today, but you report normal readings at home. Please continue to monitor your blood pressure daily and report your readings via MyChart.  -HYPERLIPIDEMIA: Hyperlipidemia means high levels of fats in the blood, such as cholesterol. Your last cholesterol check was a year ago and was normal. We will order a lipid panel to update your cholesterol levels.  -OBSTRUCTIVE SLEEP APNEA (OSA): OSA is a condition where your breathing stops and starts during sleep. You are currently using a CPAP machine and seeking VA benefits for it. No changes to your current management are needed.  -GASTROINTESTINAL ISSUES: You have occasional digestive issues and have decided against gallbladder surgery. Please follow up with a gastroenterologist for further management.  -MOOD DISORDER: A mood disorder affects your emotional state. You report feeling calmer on Sertraline (Zoloft). Continue taking Sertraline as prescribed.  -PERIPHERAL NEUROPATHY: Peripheral neuropathy is a condition that causes burning or tingling in your feet. You are taking Gabapentin at bedtime, which helps with the burning sensation and improves your sleep. Continue taking Gabapentin as prescribed.  -CARDIOLOGY FOLLOW-UP: You expressed interest in switching to a male cardiologist. We will provide you with names of potential male cardiologists to consider.  -AORTIC DILATION: Aortic dilation is an enlargement of the aorta, the main artery in your body. We will review your chart to see if updated imaging is needed and inform you via MyChart.  -GENERAL HEALTH MAINTENANCE: Continue taking your current medications as prescribed. Maintain a  healthy lifestyle to support weight loss. Monitor your blood pressure at home and report your readings.  INSTRUCTIONS:  Please check your blood pressure at home and report the readings via MyChart. We will order a lipid panel to update your cholesterol levels. Follow up with a gastroenterologist for your digestive issues. We will review your chart for the need for updated imaging of your aorta and inform you via MyChart. Additionally, we will provide you with names of potential male cardiologists for you to consider.

## 2024-01-15 NOTE — Assessment & Plan Note (Signed)
Discussed importance of weight loss.

## 2024-01-15 NOTE — Assessment & Plan Note (Addendum)
Stable. Using pepcid prn.

## 2024-01-15 NOTE — Assessment & Plan Note (Addendum)
Stable with HS gabapentin.

## 2024-01-15 NOTE — Telephone Encounter (Signed)
See mychart.

## 2024-01-15 NOTE — Assessment & Plan Note (Signed)
CT performed 2023 noted no evidence of thoracic aortic aneurysm or dissection.

## 2024-01-15 NOTE — Assessment & Plan Note (Signed)
No current GI symptoms

## 2024-01-15 NOTE — Assessment & Plan Note (Signed)
Reports no urinary issues currently.

## 2024-01-15 NOTE — Assessment & Plan Note (Signed)
No recent gout symptoms.

## 2024-01-16 LAB — MICROALBUMIN / CREATININE URINE RATIO
Creatinine,U: 50.3 mg/dL
Microalb Creat Ratio: 13.9 mg/g (ref 0.0–30.0)
Microalb, Ur: <0.7 mg/dL (ref 0.0–1.9)

## 2024-02-07 ENCOUNTER — Telehealth: Payer: Self-pay | Admitting: Family

## 2024-02-07 NOTE — Telephone Encounter (Signed)
 Copied from CRM [id number]. Topic: Medicare AWV >> Feb 07, 2024 10:32 AM Payton Doughty wrote: Reason for CRM: Called LVM 02/07/2024 to schedule AWV. Please schedule Virtual or Telehealth visits ONLY.   Verlee Rossetti; Care Guide Ambulatory Clinical Support [facility] Direct Dial: [phone]

## 2024-02-19 ENCOUNTER — Ambulatory Visit (INDEPENDENT_AMBULATORY_CARE_PROVIDER_SITE_OTHER): Payer: Medicare Other | Admitting: Urology

## 2024-02-19 ENCOUNTER — Encounter: Payer: Self-pay | Admitting: Urology

## 2024-02-19 VITALS — BP 153/90 | HR 64 | Ht 66.0 in | Wt 240.0 lb

## 2024-02-19 DIAGNOSIS — N401 Enlarged prostate with lower urinary tract symptoms: Secondary | ICD-10-CM

## 2024-02-19 DIAGNOSIS — N529 Male erectile dysfunction, unspecified: Secondary | ICD-10-CM | POA: Diagnosis not present

## 2024-02-19 DIAGNOSIS — N138 Other obstructive and reflux uropathy: Secondary | ICD-10-CM | POA: Diagnosis not present

## 2024-02-19 DIAGNOSIS — E291 Testicular hypofunction: Secondary | ICD-10-CM | POA: Diagnosis not present

## 2024-02-19 MED ORDER — TESTOSTERONE 1.62 % TD GEL
2.0000 | Freq: Every day | TRANSDERMAL | 1 refills | Status: DC
Start: 2024-02-19 — End: 2024-08-24

## 2024-02-19 NOTE — Progress Notes (Signed)
 Assessment: 1. Hypogonadism male   2. Organic impotence   3. BPH with obstruction/lower urinary tract symptoms     Plan: Labs today:  testosterone, CBC, PSA Continue Androgel 1.62% 2 pumps every day.  Rx sent to [facility] Return to office in 6 months  Chief Complaint:  Chief Complaint  Patient presents with   Hypogonadism    History of Present Illness:  Steven Payne is a 78 y.o. male who is seen for continued evaluation of hypogonadism. He was previously followed by Dr. Benancio Deeds at [facility].  He initially presented with decreased energy and decreased muscle mass as well as erectile dysfunction.  His testosterone was found to be 89.  He was prescribed topical testosterone replacement therapy.  He noted significant improvement in his symptoms with therapy.  He was using 1 pump daily of AndroGel.  He was last seen by Dr. Benancio Deeds in 2/22.  He was subsequently seen by Dr. Sabino Gasser at [facility] in April 2023.  At that time, he was using AndroGel 1 pump every other day and Cialis 20 mg as needed for erectile dysfunction. Labs from 4/23: PSA 0.6 H&H 14.4/43.8 Testosterone 167  He had not used AndroGel for approximately 5 months as his prescription expired and he had been unable to get a refill.  He reported a return of his hypogonadal symptoms with decreased energy, erectile dysfunction, and difficulty losing weight.  He also reported a decrease in his libido.  He continued to have problems with erectile dysfunction.  He had not used Cialis in some time.  This was previously working well for him.  He reported some lower urinary tract symptoms including frequency, decreased force of stream, and nocturia x 2.   No dysuria or gross hematuria.  His symptoms are not particularly bothersome for him. IPSS = 20.  He was restarted on Androgel 1.62% in March 2024. Labs from 4/24: Testosterone 207 H&H  14.4/43.1 PSA  0.9  At his visit in 10/24, he continued on  AndroGel 1.62% 1 pump daily.  His symptoms were stable.  He continued with lower urinary tract symptoms including frequency, decreased stream, nocturia x 3-4.  No dysuria or gross hematuria. IPSS = 23. He has a prescription for sildenafil but is not using this on a regular basis. Testosterone 10/24:  141 I recommended that he increase his dose to 2 pumps/day.  He returns today for scheduled follow-up.  He continues on AndroGel 1.60% 1 pump daily.  He did not increase to 2 pumps per day as recommended.  He reports that his hypogonadal symptoms are stable.  He actually reports occasional morning erections.  He continues to use sildenafil for his ED with good results. His lower urinary tract symptoms are stable with frequency, weak stream, and nocturia x 3.  No dysuria or gross hematuria. IPSS = 11/2 today.  Portions of the above documentation were copied from a prior visit for review purposes only.   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   ??   HERNIA REPAIR  1990   HYDROCELE EXCISION Left 09/10/2016   Procedure: HYDROCELECTOMY ADULT;  Surgeon: Ihor Gully, MD;  Location: [facility];  Service: Urology;  Laterality: Left;   UMBILICAL HERNIA REPAIR  1980's    Allergies:  Allergies  Allergen Reactions   Albiglutide Other (See Comments) and Itching    constipation    Family History:  Family History  Problem Relation Age of Onset   Coronary artery disease Father    Diabetes Father    Prostate cancer Father        died at 17   Alcohol abuse Father    Coronary artery disease Mother        died age 49   Alcohol abuse Mother    Cancer Mother        unsure, + thyroid cancer   Breast cancer Paternal Grandmother     Social History:  Social History   Tobacco Use   Smoking status: Former    Current packs/day: 0.00    Types: Cigarettes    Start date: 03/26/1989    Quit date: 03/27/1991    Years since quitting: 32.9   Smokeless tobacco: Never   Tobacco comments:     light smoker (smoked on 2 cigarettes per day for almost 2 years)  Vaping Use   Vaping status: Never Used  Substance Use Topics   Alcohol use: Yes    Alcohol/week: 1.0 standard drink of alcohol    Types: 1 Standard drinks or equivalent per week   Drug use: No    ROS: Constitutional:  Negative for fever, chills, weight loss CV: Negative for chest pain, previous MI, hypertension Respiratory:  Negative for shortness of breath, wheezing, sleep apnea, frequent cough GI:  Negative for nausea, vomiting, bloody stool, GERD  Physical exam: BP (!) 153/90   Pulse 64   Ht 5\' 6"  (1.676 m)   Wt 240 lb (108.9 kg)   BMI 38.74 kg/m  GENERAL APPEARANCE:  Well appearing, well  developed, well nourished, NAD HEENT:  Atraumatic, normocephalic, oropharynx clear NECK:  Supple without lymphadenopathy or thyromegaly ABDOMEN:  Soft, non-tender, no masses EXTREMITIES:  Moves all extremities well, without clubbing, cyanosis, or edema NEUROLOGIC:  Alert and oriented x 3, normal gait, CN II-XII grossly intact MENTAL STATUS:  appropriate BACK:  Non-tender to palpation, No CVAT SKIN:  Warm, dry, and intact  Results: None

## 2024-02-20 ENCOUNTER — Encounter: Payer: Self-pay | Admitting: Urology

## 2024-02-20 LAB — CBC
Hematocrit: 45 % (ref 37.5–51.0)
Hemoglobin: 14.5 g/dL (ref 13.0–17.7)
MCH: 27.1 pg (ref 26.6–33.0)
MCHC: 32.2 g/dL (ref 31.5–35.7)
MCV: 84 fL (ref 79–97)
Platelets: 362 10*3/uL (ref 150–450)
RBC: 5.35 x10E6/uL (ref 4.14–5.80)
RDW: 14.2 % (ref 11.6–15.4)
WBC: 5 10*3/uL (ref 3.4–10.8)

## 2024-02-20 LAB — PSA: Prostate Specific Ag, Serum: 0.5 ng/mL (ref 0.0–4.0)

## 2024-02-20 LAB — TESTOSTERONE: Testosterone: 178 ng/dL — ABNORMAL LOW (ref 264–916)

## 2024-03-27 ENCOUNTER — Other Ambulatory Visit: Payer: Self-pay | Admitting: Family

## 2024-04-14 ENCOUNTER — Ambulatory Visit: Payer: Medicare Other | Admitting: Family

## 2024-04-14 VITALS — BP 136/57 | HR 61 | Temp 98.6°F | Resp 16 | Ht 66.0 in | Wt 242.0 lb

## 2024-04-14 DIAGNOSIS — Z7984 Long term (current) use of oral hypoglycemic drugs: Secondary | ICD-10-CM

## 2024-04-14 DIAGNOSIS — E119 Type 2 diabetes mellitus without complications: Secondary | ICD-10-CM

## 2024-04-14 DIAGNOSIS — K219 Gastro-esophageal reflux disease without esophagitis: Secondary | ICD-10-CM

## 2024-04-14 DIAGNOSIS — F32A Depression, unspecified: Secondary | ICD-10-CM

## 2024-04-14 DIAGNOSIS — E782 Mixed hyperlipidemia: Secondary | ICD-10-CM | POA: Diagnosis not present

## 2024-04-14 DIAGNOSIS — E1142 Type 2 diabetes mellitus with diabetic polyneuropathy: Secondary | ICD-10-CM

## 2024-04-14 DIAGNOSIS — G4733 Obstructive sleep apnea (adult) (pediatric): Secondary | ICD-10-CM | POA: Diagnosis not present

## 2024-04-14 DIAGNOSIS — N529 Male erectile dysfunction, unspecified: Secondary | ICD-10-CM

## 2024-04-14 DIAGNOSIS — M109 Gout, unspecified: Secondary | ICD-10-CM | POA: Diagnosis not present

## 2024-04-14 DIAGNOSIS — I10 Essential (primary) hypertension: Secondary | ICD-10-CM | POA: Diagnosis not present

## 2024-04-14 MED ORDER — GABAPENTIN 300 MG PO CAPS
300.0000 mg | ORAL_CAPSULE | Freq: Every day | ORAL | 1 refills | Status: DC
Start: 2024-04-14 — End: 2024-08-25

## 2024-04-14 NOTE — Assessment & Plan Note (Addendum)
 Maintained on pepcid  bid. Stable.

## 2024-04-14 NOTE — Assessment & Plan Note (Signed)
>>  ASSESSMENT AND PLAN FOR GOUT WRITTEN ON 04/14/2024  9:12 AM BY O'SULLIVAN, Rosaleen Mazer, NP  Stable no recent flares.

## 2024-04-14 NOTE — Assessment & Plan Note (Signed)
 Waiting for new machine from [place].

## 2024-04-14 NOTE — Assessment & Plan Note (Signed)
Uses viagra prn.

## 2024-04-14 NOTE — Progress Notes (Signed)
 Subjective:     Patient ID: Steven Payne, male    DOB: 1946/01/08, 78 y.o.   MRN: [id number]  Chief Complaint  Patient presents with   Hypertension    Here for follow up   Diabetes    Here for follow up    Hypertension  Diabetes    Discussed the use of AI scribe software for clinical note transcription with the patient, who gave verbal consent to proceed.  History of Present Illness  Steven Payne "Steven Payne" is a 78 year old male who presents for a regular follow-up visit.  He is experiencing issues with his CPAP machine due to leakage and has not received a replacement. His sleep apnea was previously assessed as mild. He is attempting to lose weight, aiming for 210 pounds by year-end, to potentially improve his condition.  He experiences chronic pain in his legs and feet, affecting his ability to walk long distances. He avoids NSAIDs due to pancreatic concerns and uses gabapentin  for pain and sleep, which he finds beneficial. He is applying for [employer] due to his inability to work.  He reports no recent gout flare-ups, attributing this to weight loss. He is concerned about finding a new foot doctor due to his previous provider's retirement.  He experiences infrequent heartburn and recent gas issues, which he attributes to dietary changes. He uses Gas X for relief and follows a low gas diet plan.  He is on sertraline, which helps with depression and panic attacks. [facility]     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   ??   HERNIA REPAIR  1990   HYDROCELE EXCISION Left 09/10/2016   Procedure: HYDROCELECTOMY ADULT;  Surgeon: Mark Ottelin, MD;  Location: [facility];  Service: Urology;  Laterality: Left;   UMBILICAL HERNIA REPAIR  1980's    Family History  Problem Relation Age of Onset   Coronary artery disease Father     Diabetes Father    Prostate cancer Father        died at 20   Alcohol abuse Father    Coronary artery disease Mother        died age 65   Alcohol abuse Mother    Cancer Mother        unsure, + thyroid  cancer   Breast cancer Paternal Grandmother     Social History   Socioeconomic History   Marital status: Married    Spouse name: Not on file   Number of children: Not on file   Years of education: Not on file   Highest education level: Bachelor's degree (e.g., BA, AB, BS)  Occupational History   Not on file  Tobacco Use   Smoking status: Former    Current packs/day: 0.00    Types: Cigarettes    Start date: 03/26/1989    Quit date: 03/27/1991    Years since quitting: 33.0   Smokeless tobacco: Never   Tobacco comments:    light smoker (smoked on 2 cigarettes per day for almost 2 years)  Vaping Use   Vaping status: Never Used  Substance and Sexual Activity   Alcohol use: Yes    Alcohol/week: 1.0 standard drink of alcohol    Types: 1 Standard drinks or equivalent per week   Drug use: No   Sexual activity: Yes  Other Topics Concern   Not on file  Social History Narrative   Retired Electronics engineer, 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  No grandchildren.  Oldest in [place], youngest in [place]   Completed bachelors, some graduate (Actuary)   Enjoys travelling, walking, cruises   Social Drivers of Corporate investment banker Strain: Low Risk  (01/14/2024)   Overall Financial Resource Strain (CARDIA)    Difficulty of Paying Living Expenses: Not hard at all  Food Insecurity: Food Insecurity Present (01/14/2024)   Hunger Vital Sign    Worried About Running Out of Food in the Last Year: Sometimes true    Ran Out of Food in the Last Year: Sometimes true  Transportation Needs: No Transportation Needs (01/14/2024)   PRAPARE - Administrator, Civil Service (Medical): No     Lack of Transportation (Non-Medical): No  Physical Activity: Insufficiently Active (01/14/2024)   Exercise Vital Sign    Days of Exercise per Week: 4 days    Minutes of Exercise per Session: 30 min  Stress: Stress Concern Present (01/14/2024)   [employer] of Occupational Health - Occupational Stress Questionnaire    Feeling of Stress : Rather much  Social Connections: Socially Integrated (01/14/2024)   Social Connection and Isolation Panel [NHANES]    Frequency of Communication with Friends and Family: Three times a week    Frequency of Social Gatherings with Friends  and Family: Once a week    Attends Religious Services: More than 4 times per year    Active Member of Clubs or Organizations: Yes    Attends Banker Meetings: More than 4 times per year    Marital Status: Married  Catering manager Violence: Not At Risk (07/26/2023)   Received from [facility]   HITS    Over the last 12 months how often did your partner physically hurt you?: Never    Over the last 12 months how often did your partner insult you or talk down to you?: Never    Over the last 12 months how often did your partner threaten you with physical harm?: Never    Over the last 12 months how often did your partner scream or curse at you?: Never    Outpatient Medications Prior to Visit  Medication Sig Dispense Refill   amLODipine  (NORVASC ) 5 MG tablet Take 1 tablet (5 mg total) by mouth daily. 90 tablet 1   atorvastatin  (LIPITOR) 80 MG tablet Take 1 tablet (80 mg total) by mouth daily. 90 tablet 1   clobetasol cream (TEMOVATE) 0.05 % Apply 1 application topically daily.     clopidogrel  (PLAVIX ) 75 MG tablet Take 1 tablet (75 mg total) by mouth daily. 90 tablet 1   colchicine  0.6 MG tablet Take 1 tablet (0.6 mg total) by mouth daily as needed (Gout). gout 20 tablet 0   Empagliflozin-metFORMIN  HCl ER 04-999 MG TB24 Take by mouth.     ezetimibe  (ZETIA ) 10 MG tablet Take 1 tablet (10 mg total) by mouth  daily. 90 tablet 2   famotidine  (PEPCID ) 20 MG tablet Take 1 tablet (20 mg total) by mouth 2 (two) times daily. 180 tablet 1   hydrochlorothiazide  (HYDRODIURIL ) 25 MG tablet TAKE 1 TABLET DAILY. 90 tablet 3   Lancets (FREESTYLE) lancets TEST UP TO FOUR TIMES DAILY 300 each 3   RESTASIS 0.05 % ophthalmic emulsion Place 1 drop into both eyes daily.     sacubitril -valsartan  (ENTRESTO ) 24-26 MG Take 1 tablet by mouth 2 (two) times daily. Needs appointment for future refills 180 tablet 3   sertraline (ZOLOFT) 100 MG tablet Take by mouth.     sildenafil  (VIAGRA ) 100 MG tablet Take 1 tablet (100 mg total) by mouth daily as needed for erectile dysfunction. 10 tablet 11   Testosterone  1.62 % GEL Place 2 Pump onto the skin daily. 225 g 1   gabapentin  (NEURONTIN ) 300 MG capsule TAKE ONE CAPSULE BY MOUTH AT BEDTIME 90 capsule 1   No facility-administered medications prior to visit.    Allergies  Allergen Reactions   Albiglutide Other (See Comments) and Itching    constipation    ROS See HPI    Objective:     Physical Exam Constitutional:      General: He is not in acute distress.    Appearance: He is well-developed.  HENT:     Head: Normocephalic and atraumatic.  Cardiovascular:     Rate and Rhythm: Normal rate and regular rhythm.     Heart sounds: No murmur heard. Pulmonary:     Effort: Pulmonary effort is normal. No respiratory distress.     Breath sounds: Normal breath sounds. No wheezing or rales.  Skin:    General: Skin is warm and dry.  Neurological:     Mental Status: He is alert and oriented to person, place, and time.  Psychiatric:        Behavior: Behavior normal.  Thought Content: Thought content normal.      BP (!) 136/57 (BP Location: Right Arm, Patient Position: Sitting)   Pulse 61   Temp 98.6 F (37 C) (Oral)   Resp 16   Ht 5\' 6"  (1.676 m)   Wt 242 lb (109.8 kg)   SpO2 99%   BMI 39.06 kg/m  Wt Readings from Last 3 Encounters:  04/14/24 242 lb  (109.8 kg)  02/19/24 240 lb (108.9 kg)  01/15/24 243 lb (110.2 kg)       Assessment & Plan:   Problem List Items Addressed This Visit       Unprioritized   OSA (obstructive sleep apnea) - Primary   Waiting for new machine from [place].        Hyperlipidemia   Lab Results  Component Value Date   CHOL 163 01/15/2024   HDL 42.90 01/15/2024   LDLCALC 94 01/15/2024   TRIG 130.0 01/15/2024   CHOLHDL 4 01/15/2024  LDL at goal on lipitor/zetia .       HTN (hypertension)   BP Readings from Last 3 Encounters:  04/14/24 (!) 136/57  02/19/24 (!) 153/90  01/15/24 (!) 159/73   Maintained on amlodipine  and hydrochlorothiazide .  BP looks good.       Gout   Stable no recent flares.       GERD (gastroesophageal reflux disease)   Maintained on pepcid  bid. Stable.        ED (erectile dysfunction)   Uses viagra  prn.        Diabetic polyneuropathy associated with type 2 diabetes mellitus (HCC)   Relevant Medications   gabapentin  (NEURONTIN ) 300 MG capsule   Diabetes type 2, controlled (HCC)   Lab Results  Component Value Date   HGBA1C 6.1 01/15/2024   HGBA1C 6.1 10/07/2023   HGBA1C 6.4 06/04/2023   Lab Results  Component Value Date   MICROALBUR <0.7 01/15/2024   LDLCALC 94 01/15/2024   CREATININE 0.62 01/15/2024   A1C at goal.  On empagliflozin-metformin .       Relevant Orders   Basic Metabolic Panel (BMET)   HgB A1c   Depression   Mood is stable on sertraline.         I have changed Steven Payne "Andy"'s gabapentin . I am also having him maintain his freestyle, colchicine , clobetasol cream, Restasis, hydrochlorothiazide , sildenafil , Entresto , ezetimibe , famotidine , clopidogrel , atorvastatin , amLODipine , Empagliflozin-metFORMIN  HCl ER, sertraline, and Testosterone .  Meds ordered this encounter  Medications   gabapentin  (NEURONTIN ) 300 MG capsule    Sig: Take 1 capsule (300 mg total) by mouth at bedtime.    Dispense:  90 capsule    Refill:  1    Supervising  Provider:   Randie Bustle A [[id number]]

## 2024-04-14 NOTE — Assessment & Plan Note (Signed)
Stable no recent flares 

## 2024-04-14 NOTE — Assessment & Plan Note (Signed)
Mood is stable on sertraline.

## 2024-04-14 NOTE — Assessment & Plan Note (Addendum)
 Lab Results  Component Value Date   CHOL 163 01/15/2024   HDL 42.90 01/15/2024   LDLCALC 94 01/15/2024   TRIG 130.0 01/15/2024   CHOLHDL 4 01/15/2024  LDL at goal on lipitor/zetia .

## 2024-04-14 NOTE — Patient Instructions (Signed)
 VISIT SUMMARY:  Today, we addressed several of your ongoing health concerns, including sleep apnea, chronic pain, GERD, and mental health. We also discussed your current medications and made plans for follow-up appointments and referrals.  YOUR PLAN:  OBSTRUCTIVE SLEEP APNEA: You have mild obstructive sleep apnea and are experiencing issues with your CPAP machine due to leakage. -[facility]. -Avoid sleeping on your back. -Continue working on weight loss, as it may improve your symptoms.  PANIC ATTACKS: Your panic attacks are being managed with sertraline, and you are working with the [facility] to identify triggers. -Continue taking sertraline as prescribed. -[facility] with the [facility] to identify and manage triggers.  DEPRESSION: Your depression is being managed with sertraline, and there are no current concerns. -Continue taking sertraline as prescribed.  CHRONIC PAIN IN LEGS AND FEET: You have chronic pain in your legs and feet, possibly related to flat feet. You are avoiding NSAIDs and using gabapentin  for relief. -Continue taking gabapentin  as prescribed. Your prescription has been sent to [facility].  GASTROESOPHAGEAL REFLUX DISEASE (GERD): Your GERD is well-controlled with Pepcid , but you have experienced increased gas due to dietary changes. -Follow a low gas diet. -Use Gas X as needed for relief.  ERECTILE DYSFUNCTION: We discussed your erectile dysfunction, and no current intervention is needed.  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 Kennedy Peabody, with recent dosage adjustments due to low levels. -Continue following up with Dr. Kennedy Peabody for your testosterone  therapy.  FOLLOW-UP: We need to schedule some follow-up appointments and referrals. -Schedule a lab appointment in one week for A1c. -Schedule a follow-up appointment in four months. -Arrange a referral to a new cardiologist in the building.

## 2024-04-14 NOTE — Assessment & Plan Note (Signed)
 BP Readings from Last 3 Encounters:  04/14/24 (!) 136/57  02/19/24 (!) 153/90  01/15/24 (!) 159/73   Maintained on amlodipine  and hydrochlorothiazide .  BP looks good.

## 2024-04-14 NOTE — Assessment & Plan Note (Signed)
 Lab Results  Component Value Date   HGBA1C 6.1 01/15/2024   HGBA1C 6.1 10/07/2023   HGBA1C 6.4 06/04/2023   Lab Results  Component Value Date   MICROALBUR <0.7 01/15/2024   LDLCALC 94 01/15/2024   CREATININE 0.62 01/15/2024   A1C at goal.  On empagliflozin-metformin .

## 2024-04-22 ENCOUNTER — Ambulatory Visit: Payer: Self-pay | Admitting: Family

## 2024-04-22 ENCOUNTER — Other Ambulatory Visit (INDEPENDENT_AMBULATORY_CARE_PROVIDER_SITE_OTHER)

## 2024-04-22 DIAGNOSIS — E119 Type 2 diabetes mellitus without complications: Secondary | ICD-10-CM | POA: Diagnosis not present

## 2024-04-22 LAB — BASIC METABOLIC PANEL WITH GFR
BUN: 8 mg/dL (ref 6–23)
CO2: 23 meq/L (ref 19–32)
Calcium: 8.8 mg/dL (ref 8.4–10.5)
Chloride: 105 meq/L (ref 96–112)
Creatinine, Ser: 0.72 mg/dL (ref 0.40–1.50)
GFR: 86.92 mL/min (ref >60.00)
Glucose, Bld: 94 mg/dL (ref 70–99)
Potassium: 4 meq/L (ref 3.5–5.1)
Sodium: 139 meq/L (ref 135–145)

## 2024-04-22 LAB — HEMOGLOBIN A1C: Hgb A1c MFr Bld: 6.1 % (ref 4.6–6.5)

## 2024-05-05 ENCOUNTER — Other Ambulatory Visit (HOSPITAL_BASED_OUTPATIENT_CLINIC_OR_DEPARTMENT_OTHER): Payer: Self-pay

## 2024-05-05 DIAGNOSIS — Z23 Encounter for immunization: Secondary | ICD-10-CM | POA: Diagnosis not present

## 2024-05-05 MED ORDER — COMIRNATY 30 MCG/0.3ML IM SUSY
0.3000 mL | PREFILLED_SYRINGE | Freq: Once | INTRAMUSCULAR | 0 refills | Status: AC
  Filled 2024-05-05: qty 0.3, 1d supply, fill #0

## 2024-05-24 ENCOUNTER — Encounter (HOSPITAL_BASED_OUTPATIENT_CLINIC_OR_DEPARTMENT_OTHER): Payer: Self-pay | Admitting: Emergency Medicine

## 2024-05-24 ENCOUNTER — Emergency Department (HOSPITAL_BASED_OUTPATIENT_CLINIC_OR_DEPARTMENT_OTHER)

## 2024-05-24 ENCOUNTER — Emergency Department (HOSPITAL_BASED_OUTPATIENT_CLINIC_OR_DEPARTMENT_OTHER): Admission: EM | Admit: 2024-05-24 | Discharge: 2024-05-24 | Disposition: A | Discharge status: Home / Self Care

## 2024-05-24 ENCOUNTER — Other Ambulatory Visit: Payer: Self-pay

## 2024-05-24 DIAGNOSIS — Z79899 Other long term (current) drug therapy: Secondary | ICD-10-CM | POA: Diagnosis not present

## 2024-05-24 DIAGNOSIS — Z7901 Long term (current) use of anticoagulants: Secondary | ICD-10-CM | POA: Diagnosis not present

## 2024-05-24 DIAGNOSIS — J069 Acute upper respiratory infection, unspecified: Secondary | ICD-10-CM | POA: Diagnosis not present

## 2024-05-24 DIAGNOSIS — I10 Essential (primary) hypertension: Secondary | ICD-10-CM | POA: Diagnosis not present

## 2024-05-24 DIAGNOSIS — R059 Cough, unspecified: Secondary | ICD-10-CM | POA: Diagnosis not present

## 2024-05-24 DIAGNOSIS — E119 Type 2 diabetes mellitus without complications: Secondary | ICD-10-CM | POA: Insufficient documentation

## 2024-05-24 DIAGNOSIS — B9789 Other viral agents as the cause of diseases classified elsewhere: Secondary | ICD-10-CM | POA: Diagnosis not present

## 2024-05-24 LAB — RESP PANEL BY RT-PCR (RSV, FLU A&B, COVID)  RVPGX2
Influenza A by PCR: NEGATIVE
Influenza B by PCR: NEGATIVE
Resp Syncytial Virus by PCR: NEGATIVE
SARS Coronavirus 2 by RT PCR: NEGATIVE

## 2024-05-24 NOTE — ED Triage Notes (Signed)
 Pt c/o cough and congestion x 2 days after returning from cruise

## 2024-05-24 NOTE — Discharge Instructions (Signed)
 You appear to have an upper respiratory infection (URI). An upper respiratory tract infection, or cold, is a viral infection of the air passages leading to the lungs. It should improve gradually after 5-7 days. You may have a lingering cough that lasts for 2- 4 weeks after the infection.  Your flu, covid, and RSV test were negative today.  Your x-ray did not show any signs of pneumonia.  There are no medications, such as antibiotics, that will cure your infection.   Home care instructions:  You can take Tylenol  and/or Ibuprofen as directed on the packaging for fever reduction and pain relief.    For cough: honey 1/2 to 1 teaspoon (you can dilute the honey in water or another fluid).  You can also use guaifenesin  and dextromethorphan for cough which are over-the-counter medications. You can use a humidifier for chest congestion and cough.  If you don't have a humidifier, you can sit in the bathroom with the hot shower running.      For sore throat: try warm salt water gargles, cepacol lozenges, throat spray, warm tea or water with lemon/honey, popsicles or ice, or OTC cold relief medicine for throat discomfort.    For congestion: Flonase (Fluticasone) 1-2 sprays in each nostril daily. This is an over the counter medication.    It is important to stay hydrated: drink plenty of fluids (water, gatorade/powerade/pedialyte, juices, or teas) to keep your throat moisturized and help further relieve irritation/discomfort.   Your illness is contagious and can be spread to others, especially during the first 3 or 4 days. It cannot be cured by antibiotics or other medicines. Take basic precautions such as washing your hands often, covering your mouth when you cough or sneeze, and avoiding public places where you could spread your illness to others.   Follow-up instructions: Please follow-up with your primary care provider for further evaluation of your symptoms if you are not feeling better within the next 5  days.   Return instructions:  Please return to the Emergency Department if you experience worsening symptoms.  RETURN IMMEDIATELY IF you develop shortness of breath, confusion or altered mental status, a new rash, become dizzy, faint, or poorly responsive, or are unable to be cared for at home. Please return if you have persistent vomiting and cannot keep down fluids or develop a fever that is not controlled by tylenol  or motrin.   Please return if you have any other emergent concerns.

## 2024-05-24 NOTE — ED Provider Notes (Signed)
 [place] [facility]PARTMENT AT [facility] Provider Note   CSN: [id number] Arrival date & time: 05/24/24  1548     Patient presents with: Cough   Steven Payne is a 78 y.o. male with pertinent history of type 2 diabetes, hypertension, presents with concern for a productive cough and congestion for the past 2 days.  States the mucus is green appearing.  Steven Payne denies any fever, chills, shortness of breath, abdominal pain, nausea or vomiting.  Does report a scratchy throat.  States this started after returning from his cruise.     Cough      Prior to Admission medications   Medication Sig Start Date End Date Taking? Authorizing Provider  amLODipine  (NORVASC ) 5 MG tablet Take 1 tablet (5 mg total) by mouth daily. 06/04/23   O'Sullivan, Melissa, NP  atorvastatin  (LIPITOR) 80 MG tablet Take 1 tablet (80 mg total) by mouth daily. 06/04/23   O'Sullivan, Melissa, NP  clobetasol cream (TEMOVATE) 0.05 % Apply 1 application topically daily. 11/29/21   [provider]  clopidogrel  (PLAVIX ) 75 MG tablet Take 1 tablet (75 mg total) by mouth daily. 06/04/23   O'Sullivan, Melissa, NP  colchicine  0.6 MG tablet Take 1 tablet (0.6 mg total) by mouth daily as needed (Gout). gout 09/29/21   Daryl Setter, NP  Empagliflozin-metFORMIN  HCl ER 04-999 MG TB24 Take by mouth. 09/03/23   [provider]  ezetimibe  (ZETIA ) 10 MG tablet Take 1 tablet (10 mg total) by mouth daily. 05/20/23   Revankar, Jennifer SAUNDERS, MD  famotidine  (PEPCID ) 20 MG tablet Take 1 tablet (20 mg total) by mouth 2 (two) times daily. 06/04/23   O'Sullivan, Melissa, NP  gabapentin  (NEURONTIN ) 300 MG capsule Take 1 capsule (300 mg total) by mouth at bedtime. 04/14/24   O'Sullivan, Melissa, NP  hydrochlorothiazide  (HYDRODIURIL ) 25 MG tablet TAKE 1 TABLET DAILY. 02/18/23   Webb, Padonda B, FNP  Lancets (FREESTYLE) lancets TEST UP TO FOUR TIMES DAILY 10/03/20   Daryl Setter, NP  RESTASIS 0.05 % ophthalmic emulsion Place 1  drop into both eyes daily. 11/29/21   [provider]  sacubitril -valsartan  (ENTRESTO ) 24-26 MG Take 1 tablet by mouth 2 (two) times daily. Needs appointment for future refills 03/18/23   Revankar, Jennifer SAUNDERS, MD  sertraline (ZOLOFT) 100 MG tablet Take by mouth. 08/07/23   [provider]  sildenafil  (VIAGRA ) 100 MG tablet Take 1 tablet (100 mg total) by mouth daily as needed for erectile dysfunction. 02/26/23   Stoneking, Adine PARAS., MD  Testosterone  1.62 % GEL Place 2 Pump onto the skin daily. 02/19/24   Stoneking, Adine PARAS., MD    Allergies: Albiglutide    Review of Systems  Respiratory:  Positive for cough.     Updated Vital Signs BP 120/81 (BP Location: Right Arm)   Pulse 72   Temp (!) 97.1 F (36.2 C)   Resp 16   Ht 5' 6 (1.676 m)   Wt 108.9 kg   SpO2 93%   BMI 38.74 kg/m   Physical Exam Vitals and nursing note reviewed.  Constitutional:      General: Steven Payne is not in acute distress.    Appearance: Steven Payne is well-developed.  HENT:     Head: Normocephalic and atraumatic.     Mouth/Throat:     Pharynx: No oropharyngeal exudate or posterior oropharyngeal erythema.   Eyes:     Conjunctiva/sclera: Conjunctivae normal.    Cardiovascular:     Rate and Rhythm: Normal rate and regular rhythm.  Heart sounds: No murmur heard. Pulmonary:     Effort: Pulmonary effort is normal. No respiratory distress.     Comments: Mild rhonchi in the bases bilaterally Talking in full sentences on room air without difficulty. Abdominal:     Palpations: Abdomen is soft.     Tenderness: There is no abdominal tenderness.   Musculoskeletal:        General: No swelling.     Cervical back: Neck supple.  Lymphadenopathy:     Cervical: No cervical adenopathy.   Skin:    General: Skin is warm and dry.     Capillary Refill: Capillary refill takes less than 2 seconds.   Neurological:     Mental Status: Steven Payne is alert.   Psychiatric:        Mood and Affect: Mood normal.     (all  labs ordered are listed, but only abnormal results are displayed) Labs Reviewed  RESP PANEL BY RT-PCR (RSV, FLU A&B, COVID)  [id number]    EKG: None  Radiology: DG Chest 2 View Result Date: 05/24/2024 CLINICAL DATA:  Cough EXAM: CHEST - 2 VIEW COMPARISON:  None Available. FINDINGS: Normal mediastinum and cardiac silhouette. Normal pulmonary vasculature. No evidence of effusion, infiltrate, or pneumothorax. No acute bony abnormality. IMPRESSION: No acute cardiopulmonary process Electronically Signed   By: Jackquline Boxer M.D.   On: 05/24/2024 16:47     Procedures   Medications Ordered in the ED - No data to display                                  Medical Decision Making Amount and/or Complexity of Data Reviewed Radiology: ordered.     Differential diagnosis includes but is not limited to COVID, flu, RSV, viral URI, strep pharyngitis, viral pharyngitis, allergic rhinitis, pneumonia, bronchitis   ED Course:  Upon initial evaluation, patient is well-appearing, no acute distress.  Stable vitals.  Reporting productive cough for the past 2 days.  Lungs with mild rhonchi bilaterally.  Able to talk in full sentences on arrival without difficulty.  Not feeling short of breath.  Reports throat irritation, but posterior oropharynx without any significant erythema, no tonsillar exudate. Swallowing without difficulty  Labs Ordered: I Ordered, and personally interpreted labs.  The pertinent results include:   COVID, flu, RSV negative  Imaging Studies ordered: I ordered imaging studies including chest x-ray I independently visualized the imaging with scope of interpretation limited to determining acute life threatening conditions related to emergency care. Imaging showed no acute abnormalities I agree with the radiologist interpretation  Upon re-evaluation, patient remains well-appearing.  Given cough to started 2 days ago, no fevers, and chest x-ray without any consolidations, I have low  concern for pneumonia.  COVID, flu, RSV testing negative.  Suspect viral URI.  Stable and appropriate for discharge home    Impression: Viral URI  Disposition:  The patient was discharged home with instructions to use over-the-counter medications as needed for symptom control.  Follow-up with PCP if symptoms not improving within the next 5 days. Return precautions given.    This chart was dictated using voice recognition software, Dragon. Despite the best efforts of this provider to proofread and correct errors, errors may still occur which can change documentation meaning.       Final diagnoses:  Viral URI with cough    ED Discharge Orders     None  Veta Palma, PA-C 05/24/24 1718    Ula Prentice SAUNDERS, MD 05/24/24 [phone]

## 2024-06-01 ENCOUNTER — Other Ambulatory Visit (HOSPITAL_BASED_OUTPATIENT_CLINIC_OR_DEPARTMENT_OTHER): Payer: Self-pay

## 2024-06-01 ENCOUNTER — Emergency Department (HOSPITAL_BASED_OUTPATIENT_CLINIC_OR_DEPARTMENT_OTHER)
Admission: EM | Admit: 2024-06-01 | Discharge: 2024-06-01 | Disposition: A | Discharge status: Home / Self Care | Attending: Emergency Medicine | Admitting: Emergency Medicine

## 2024-06-01 ENCOUNTER — Emergency Department (HOSPITAL_BASED_OUTPATIENT_CLINIC_OR_DEPARTMENT_OTHER)

## 2024-06-01 ENCOUNTER — Other Ambulatory Visit: Payer: Self-pay

## 2024-06-01 ENCOUNTER — Encounter (HOSPITAL_BASED_OUTPATIENT_CLINIC_OR_DEPARTMENT_OTHER): Payer: Self-pay | Admitting: Emergency Medicine

## 2024-06-01 DIAGNOSIS — R052 Subacute cough: Secondary | ICD-10-CM | POA: Diagnosis not present

## 2024-06-01 DIAGNOSIS — R059 Cough, unspecified: Secondary | ICD-10-CM | POA: Diagnosis present

## 2024-06-01 MED ORDER — AZITHROMYCIN 250 MG PO TABS
ORAL_TABLET | ORAL | 0 refills | Status: AC
  Filled 2024-06-01: qty 6, 5d supply, fill #0

## 2024-06-01 NOTE — Discharge Instructions (Addendum)
 We are treating you with a 5-day course of antibiotics.  If this does not improve your symptoms, please follow-up with your primary care physician.  If you develop fever, coughing up blood, trouble breathing, or any other new/concerning symptoms then return to the ER for evaluation.

## 2024-06-01 NOTE — ED Triage Notes (Signed)
 Persistent cough x over 1 week , was seen and treated . Denies chest pain or shortness of breath . Wakes up with sweats . Green sputum

## 2024-06-01 NOTE — ED Provider Notes (Signed)
 [place] [facility] Provider Note   CSN: [id number] Arrival date & time: 06/01/24  9195     Patient presents with: Cough   Steven Payne is a 78 y.o. male.   HPI 78 year old male presents with a cough.  He has had this cough for 2+ weeks.  It is productive of sputum.  He denies fevers but has been having some sweating at night recently.  No unintentional weight loss.  No shortness of breath.  When he is in a coughing spell his chest will get sore but he does not have any chest pain otherwise.  He was seen here about 8 days ago with no specific findings but states he is continuing over-the-counter meds such as Mucinex  but still having progressive and continued symptoms.  No leg swelling.  Prior to Admission medications   Medication Sig Start Date End Date Taking? Authorizing Provider  azithromycin  (ZITHROMAX ) 250 MG tablet Take 1 tablet (250 mg total) by mouth daily. Take first 2 tablets together, then 1 every day until finished. 06/01/24  Yes Freddi Hamilton, MD  amLODipine  (NORVASC ) 5 MG tablet Take 1 tablet (5 mg total) by mouth daily. 06/04/23   O'Sullivan, Melissa, NP  atorvastatin  (LIPITOR) 80 MG tablet Take 1 tablet (80 mg total) by mouth daily. 06/04/23   O'Sullivan, Melissa, NP  clobetasol cream (TEMOVATE) 0.05 % Apply 1 application topically daily. 11/29/21   [provider]  clopidogrel  (PLAVIX ) 75 MG tablet Take 1 tablet (75 mg total) by mouth daily. 06/04/23   O'Sullivan, Melissa, NP  colchicine  0.6 MG tablet Take 1 tablet (0.6 mg total) by mouth daily as needed (Gout). gout 09/29/21   Daryl Setter, NP  Empagliflozin-metFORMIN  HCl ER 04-999 MG TB24 Take by mouth. 09/03/23   [provider]  ezetimibe  (ZETIA ) 10 MG tablet Take 1 tablet (10 mg total) by mouth daily. 05/20/23   Revankar, Jennifer SAUNDERS, MD  famotidine  (PEPCID ) 20 MG tablet Take 1 tablet (20 mg total) by mouth 2 (two) times daily. 06/04/23   O'Sullivan, Melissa, NP   gabapentin  (NEURONTIN ) 300 MG capsule Take 1 capsule (300 mg total) by mouth at bedtime. 04/14/24   O'Sullivan, Melissa, NP  hydrochlorothiazide  (HYDRODIURIL ) 25 MG tablet TAKE 1 TABLET DAILY. 02/18/23   Webb, Padonda B, FNP  Lancets (FREESTYLE) lancets TEST UP TO FOUR TIMES DAILY 10/03/20   Daryl Setter, NP  RESTASIS 0.05 % ophthalmic emulsion Place 1 drop into both eyes daily. 11/29/21   [provider]  sacubitril -valsartan  (ENTRESTO ) 24-26 MG Take 1 tablet by mouth 2 (two) times daily. Needs appointment for future refills 03/18/23   Revankar, Jennifer SAUNDERS, MD  sertraline (ZOLOFT) 100 MG tablet Take by mouth. 08/07/23   [provider]  sildenafil  (VIAGRA ) 100 MG tablet Take 1 tablet (100 mg total) by mouth daily as needed for erectile dysfunction. 02/26/23   Stoneking, Adine PARAS., MD  Testosterone  1.62 % GEL Place 2 Pump onto the skin daily. 02/19/24   Stoneking, Adine PARAS., MD    Allergies: Albiglutide    Review of Systems  Constitutional:  Positive for diaphoresis. Negative for fever and unexpected weight change.  Respiratory:  Positive for cough. Negative for shortness of breath.   Cardiovascular:  Negative for leg swelling.    Updated Vital Signs BP (!) 149/89   Pulse 63   Temp 98.8 F (37.1 C) (Oral)   Resp 18   Wt 108 kg   SpO2 99%   BMI 38.43 kg/m  Physical Exam Vitals and nursing note reviewed.  Constitutional:      Appearance: He is well-developed.  HENT:     Head: Normocephalic and atraumatic.   Cardiovascular:     Rate and Rhythm: Normal rate and regular rhythm.     Heart sounds: Normal heart sounds.  Pulmonary:     Effort: Pulmonary effort is normal. No tachypnea or accessory muscle usage.     Comments: Mildly decreased breath sounds and faint crackles in left lower lung fields  Skin:    General: Skin is warm and dry.   Neurological:     Mental Status: He is alert.     (all labs ordered are listed, but only abnormal results are  displayed) Labs Reviewed - No data to display  EKG: None  Radiology: DG Chest 2 View Result Date: 06/01/2024 CLINICAL DATA:  Cough. EXAM: CHEST - 2 VIEW COMPARISON:  Chest radiograph dated 05/24/2024 FINDINGS: No focal consolidation, pleural effusion, or pneumothorax. The cardiac silhouette is within normal limits. No acute osseous pathology. IMPRESSION: No active cardiopulmonary disease. Electronically Signed   By: Vanetta Chou M.D.   On: 06/01/2024 09:24     Procedures   Medications Ordered in the ED - No data to display                                  Medical Decision Making Amount and/or Complexity of Data Reviewed Radiology: ordered and independent interpretation performed.    Details: No lobar pneumonia  Risk Prescription drug management.   Patient presents with continued cough.  Given how long he has been having symptoms (is on his third week) as well as no improvement and maybe a little worsening, I think is reasonable to put him on a course of azithromycin  for possible atypical pneumonia or pertussis.  Otherwise, he is not distress.  O2 sats are normal.  Mild hypertension but can get this rechecked at his doctor's office.  Will discharge home with return precautions.  Of note, there is a potential medication interaction with azithromycin  and colchicine  but he states he has not taken colchicine  in years due to no recent gout flares and does not even have any more of this medicine.     Final diagnoses:  Subacute cough    ED Discharge Orders          Ordered    azithromycin  (ZITHROMAX ) 250 MG tablet  Daily        06/01/24 0945               Freddi Hamilton, MD 06/01/24 [phone]

## 2024-06-22 ENCOUNTER — Other Ambulatory Visit (HOSPITAL_BASED_OUTPATIENT_CLINIC_OR_DEPARTMENT_OTHER): Payer: Self-pay

## 2024-06-22 ENCOUNTER — Ambulatory Visit (INDEPENDENT_AMBULATORY_CARE_PROVIDER_SITE_OTHER): Admitting: Internal Medicine

## 2024-06-22 VITALS — BP 138/86 | HR 61 | Temp 97.8°F | Resp 16 | Ht 66.0 in | Wt 239.1 lb

## 2024-06-22 DIAGNOSIS — R052 Subacute cough: Secondary | ICD-10-CM

## 2024-06-22 MED ORDER — PANTOPRAZOLE SODIUM 40 MG PO TBEC
40.0000 mg | DELAYED_RELEASE_TABLET | Freq: Every day | ORAL | 0 refills | Status: AC
  Filled 2024-06-22: qty 30, 30d supply, fill #0

## 2024-06-22 NOTE — Patient Instructions (Signed)
 Take pantoprazole  40 mg 1 tablet daily on empty stomach  Consistent use of Astepro: Over-the-counter allergy medicine, 2 sprays on each side of the nose twice daily  Call if not gradually better in the next 2 to 3 weeks.

## 2024-06-22 NOTE — Progress Notes (Signed)
 Subjective:    Patient ID: Steven Payne, male    DOB: 12-16-1945, 78 y.o.   MRN: [id number]  DOS:  06/22/2024 Type of visit - description: Acute  Symptoms started approximately 05/22/2024 shortly after the patient and his wife came back from cruise. Both developed upper respiratory symptoms but his wife is now back to normal.  Presented to the ER on 05/24/2024: Had a runny nose, sinus congestion, cough which occasional sputum production. Chest x-ray negative. COVID and flu negative.  06/01/2024, went to the ER again with similar symptoms, on exam, the provider noted left lower lobe  faint crackles on exam, Rx Zithromax   He is here because he is still coughing. After the cruise, he did not have any calf pain or swelling. No chest pain or difficulty breathing Denies fevers, some night sweats?. Denies weight loss No nausea vomiting, no diarrhea.  No blood in the stools.  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   ??   HERNIA REPAIR  1990   HYDROCELE EXCISION Left 09/10/2016   Procedure: HYDROCELECTOMY ADULT;  Surgeon: Mark Ottelin, MD;  Location: [facility];  Service: Urology;  Laterality: Left;   UMBILICAL HERNIA REPAIR  1980's    Current Outpatient Medications  Medication Instructions   amLODipine  (NORVASC ) 5 mg, Oral, Daily   atorvastatin  (LIPITOR) 80 mg, Oral,  Daily   clobetasol cream (TEMOVATE) 0.05 % 1 application , Daily   clopidogrel  (PLAVIX ) 75 mg, Oral, Daily   colchicine  0.6 mg, Oral, Daily PRN, gout   Empagliflozin-metFORMIN  HCl ER 04-999 MG TB24 Take by mouth.   ezetimibe  (ZETIA ) 10 mg, Oral, Daily   famotidine  (PEPCID ) 20 mg, Oral, 2 times daily   gabapentin  (NEURONTIN ) 300 mg, Oral, Daily at bedtime   hydrochlorothiazide  (HYDRODIURIL ) 25 mg, Oral, Daily   Lancets (FREESTYLE) lancets TEST UP TO FOUR TIMES DAILY    pantoprazole  (PROTONIX ) 40 mg, Oral, Daily   RESTASIS 0.05 % ophthalmic emulsion 1 drop, Daily   sacubitril -valsartan  (ENTRESTO ) 24-26 MG 1 tablet, Oral, 2 times daily, Needs appointment for future refills   sertraline (ZOLOFT) 100 MG tablet Take by mouth.   sildenafil  (VIAGRA ) 100 mg, Oral, Daily PRN   Testosterone  1.62 % GEL 2 Pump, Transdermal, Daily       Objective:   Physical Exam BP 138/86   Pulse 61   Temp 97.8 F (36.6 C) (Oral)   Resp 16   Ht 5' 6 (1.676 m)   Wt 239 lb 2 oz (108.5 kg)   SpO2 97%   BMI 38.60 kg/m  General:   Well developed, NAD, BMI noted. HEENT:  Normocephalic . Face symmetric, atraumatic. TMs: Normal. Throat: Symmetric, normal rate. Lungs:  CTA B Normal respiratory effort, no intercostal retractions, no accessory muscle use. Heart: RRR,  no murmur.  Lower extremities: no pretibial edema bilaterally  Skin: Not pale. Not jaundice Neurologic:  alert & oriented X3.  Speech normal, gait appropriate for age and unassisted Psych--  Cognition and judgment appear intact.  Cooperative with normal attention span and concentration.  Behavior appropriate. No anxious or depressed appearing.      Assessment    78 year old patient, PMH includes gout, DM, GERD, HTN,  high cholesterol, OSA, among other conditions, presents with  Cough:  Persisting cough after he came back from a cruise, see HPI: Chest x-ray negative, status post Zithromax . No red flag symptoms other than some night sweats.  Physical exam is benign.  Former smoker, no history of asthma. He does have postnasal dripping. Plan: Astepro consistently for a month. Trial with pantoprazole  Come back if not better in approximately 2 weeks, we will have to consider further evaluation.

## 2024-07-24 DIAGNOSIS — H04123 Dry eye syndrome of bilateral lacrimal glands: Secondary | ICD-10-CM | POA: Diagnosis not present

## 2024-07-24 DIAGNOSIS — E119 Type 2 diabetes mellitus without complications: Secondary | ICD-10-CM | POA: Diagnosis not present

## 2024-07-24 DIAGNOSIS — Z961 Presence of intraocular lens: Secondary | ICD-10-CM | POA: Diagnosis not present

## 2024-07-24 DIAGNOSIS — H40041 Steroid responder, right eye: Secondary | ICD-10-CM | POA: Diagnosis not present

## 2024-07-24 LAB — HM DIABETES EYE EXAM

## 2024-08-11 ENCOUNTER — Ambulatory Visit (INDEPENDENT_AMBULATORY_CARE_PROVIDER_SITE_OTHER)

## 2024-08-11 VITALS — Ht 66.0 in | Wt 239.0 lb

## 2024-08-11 DIAGNOSIS — Z0000 Encounter for general adult medical examination without abnormal findings: Secondary | ICD-10-CM

## 2024-08-11 NOTE — Progress Notes (Signed)
 Subjective:   Steven Payne is a 78 y.o. who presents for a Medicare Wellness preventive visit.  As a reminder, Annual Wellness Visits don't include a physical exam, and some assessments may be limited, especially if this visit is performed virtually. We may recommend an in-person follow-up visit with your provider if needed.  Visit Complete: Virtual I connected with  Steven Payne on 08/11/24 by a audio enabled telemedicine application and verified that I am speaking with the correct person using two identifiers.  Patient Location: Home  Provider Location: Home Office  I discussed the limitations of evaluation and management by telemedicine. The patient expressed understanding and agreed to proceed.  Vital Signs: Because this visit was a virtual/telehealth visit, some criteria may be missing or patient reported. Any vitals not documented were not able to be obtained and vitals that have been documented are patient reported.    Persons Participating in Visit: Patient.  AWV Questionnaire: Yes: Patient Medicare AWV questionnaire was completed by the patient on 08/04/24; I have confirmed that all information answered by patient is correct and no changes since this date.  Cardiac Risk Factors include: advanced age (>17men, >57 women);male gender;diabetes mellitus;hypertension     Objective:    Today's Vitals   08/11/24 0813  Weight: 239 lb (108.4 kg)  Height: 5' 6 (1.676 m)   Body mass index is 38.58 kg/m.     08/11/2024    8:25 AM 06/01/2024    8:21 AM 05/24/2024    3:55 PM 03/19/2023    8:29 AM 12/22/2022    7:49 AM 08/30/2022    8:56 AM 01/01/2022    7:43 AM  Advanced Directives  Does Patient Have a Medical Advance Directive? No No No No No No No  Would patient like information on creating a medical advance directive? No - Patient declined   No - Patient declined  Yes (MAU/Ambulatory/Procedural Areas - Information given) No - Patient declined    Current Medications  (verified) Outpatient Encounter Medications as of 08/11/2024  Medication Sig   amLODipine  (NORVASC ) 5 MG tablet Take 1 tablet (5 mg total) by mouth daily.   atorvastatin  (LIPITOR) 80 MG tablet Take 1 tablet (80 mg total) by mouth daily.   clobetasol cream (TEMOVATE) 0.05 % Apply 1 application topically daily.   clopidogrel  (PLAVIX ) 75 MG tablet Take 1 tablet (75 mg total) by mouth daily.   colchicine  0.6 MG tablet Take 1 tablet (0.6 mg total) by mouth daily as needed (Gout). gout   Empagliflozin-metFORMIN  HCl ER 04-999 MG TB24 Take by mouth.   ezetimibe  (ZETIA ) 10 MG tablet Take 1 tablet (10 mg total) by mouth daily.   famotidine  (PEPCID ) 20 MG tablet Take 1 tablet (20 mg total) by mouth 2 (two) times daily.   gabapentin  (NEURONTIN ) 300 MG capsule Take 1 capsule (300 mg total) by mouth at bedtime.   hydrochlorothiazide  (HYDRODIURIL ) 25 MG tablet TAKE 1 TABLET DAILY.   Lancets (FREESTYLE) lancets TEST UP TO FOUR TIMES DAILY   pantoprazole  (PROTONIX ) 40 MG tablet Take 1 tablet (40 mg total) by mouth daily.   RESTASIS 0.05 % ophthalmic emulsion Place 1 drop into both eyes daily.   sacubitril -valsartan  (ENTRESTO ) 24-26 MG Take 1 tablet by mouth 2 (two) times daily. Needs appointment for future refills   sertraline (ZOLOFT) 100 MG tablet Take by mouth.   sildenafil  (VIAGRA ) 100 MG tablet Take 1 tablet (100 mg total) by mouth daily as needed for erectile dysfunction.   Testosterone  1.62 %  GEL Place 2 Pump onto the skin daily.   No facility-administered encounter medications on file as of 08/11/2024.    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   ??   HERNIA REPAIR  1990   HYDROCELE EXCISION Left 09/10/2016   Procedure: HYDROCELECTOMY ADULT;  Surgeon: Mark Ottelin, MD;  Location: [facility];  Service: Urology;  Laterality: Left;   UMBILICAL HERNIA REPAIR  1980's   Family History  Problem Relation Age of Onset   Coronary artery disease Father    Diabetes Father    Prostate cancer Father         died at 38   Alcohol abuse Father    Coronary artery disease Mother        died age 73   Alcohol abuse Mother    Cancer Mother        unsure, + thyroid  cancer   Breast cancer Paternal Grandmother    Social History   Socioeconomic History   Marital status: Married    Spouse name: Not on file   Number of children: Not on file   Years of education: Not on file   Highest education level: Bachelor's degree (e.g., BA, AB, BS)  Occupational History   Not on file  Tobacco Use   Smoking status: Former    Current packs/day: 0.00    Types: Cigarettes    Start date: 03/26/1989    Quit date: 03/27/1991    Years since quitting: 33.4   Smokeless tobacco: Never   Tobacco comments:  light smoker (smoked on 2 cigarettes per day for almost 2 years)  Vaping Use   Vaping status: Never Used  Substance and Sexual Activity   Alcohol use: Yes    Alcohol/week: 1.0 standard drink of alcohol    Types: 1 Standard drinks or equivalent per week   Drug use: No   Sexual activity: Yes  Other Topics Concern   Not on file  Social History Narrative   Retired Electronics engineer, 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  No grandchildren.  Oldest in [place], youngest in [place]   Completed bachelors, some graduate (Actuary)   Enjoys travelling, walking, cruises   Social Drivers of Corporate investment banker Strain: Low Risk  (08/11/2024)   Overall Financial Resource Strain (CARDIA)    Difficulty of Paying Living Expenses: Not hard at all  Food Insecurity: No Food Insecurity (08/11/2024)   Hunger Vital Sign    Worried About Running Out of Food in the Last Year: Never true    Ran Out of Food in the Last Year: Never true  Transportation Needs: No Transportation Needs (08/11/2024)   PRAPARE - Administrator, Civil Service (Medical): No    Lack of Transportation (Non-Medical): No  Physical Activity:  Insufficiently Active (08/11/2024)   Exercise Vital Sign    Days of Exercise per Week: 3 days    Minutes of Exercise per Session: 30 min  Stress: No Stress Concern Present (08/11/2024)   [employer] of Occupational Health - Occupational Stress Questionnaire    Feeling of Stress: Not at all  Recent Concern: Stress - Stress Concern Present (06/22/2024)   [employer] of Occupational Health - Occupational Stress Questionnaire    Feeling of Stress: Rather much  Social Connections: Socially Integrated (08/11/2024)   Social Connection and Isolation Panel    Frequency of Communication with Friends and Family: More than three times a week    Frequency of Social Gatherings with Friends and Family: More than three times a week    Attends Religious Services: More than 4 times per year    Active Member of [employer] or Organizations: Yes    Attends Engineer, structural: More than 4 times per year    Marital Status: Married    Tobacco Counseling Counseling given: Not Answered Tobacco comments: light smoker (smoked on 2 cigarettes per day for almost 2 years)    Clinical Intake:  Pre-visit preparation completed: Yes  Pain : No/denies pain     BMI - recorded: 38.58 Nutritional Status: BMI > 30  Obese Nutritional Risks: None Diabetes: Yes CBG done?: No Did pt. bring in CBG monitor from home?: No  Lab Results  Component Value Date   HGBA1C 6.1 04/22/2024   HGBA1C 6.1 01/15/2024   HGBA1C 6.1 10/07/2023     How often do you need to have someone help you when you read instructions, pamphlets, or other written materials from your doctor or pharmacy?: 1 - Never  Interpreter Needed?: No  Information entered by :: Rojelio Blush LPN   Activities of Daily Living     08/11/2024    8:20 AM 08/04/2024    8:09 AM  In your present state of health, do you have any difficulty performing the following activities:  Hearing? 1 0  Comment Wears Hearing Aids   Vision? 0 0  Difficulty  concentrating or making decisions? 0  0  Walking or climbing stairs? 1 0  Comment Uses a Restaurant manager, fast food   Dressing or bathing? 0 0  Doing errands, shopping? 0 0  Preparing Food and eating ? N N  Using the Toilet? N N  In the past six months, have you accidently leaked urine? Y Y  Comment Followed by Urologist   Do you have problems with loss of bowel control? N N  Managing your Medications? N N  Managing your Finances? N N  Housekeeping or managing your Housekeeping? N N    Patient Care Team: Daryl Setter, NP as PCP - General (Internal Medicine) Marcey Elspeth PARAS, MD as Consulting Physician (Ophthalmology) Nancyann DOROTHA Ebbing, M.D., PA  I have updated your Care Teams any recent Medical Services you may have received from other providers in the past year.     Assessment:   This is a routine wellness examination for Steven Payne.  Hearing/Vision screen Hearing Screening - Comments:: Wears Hearing Aids Vision Screening - Comments:: Wears rx glasses - up to date with routine eye exams with  Dr Marcey   Goals Addressed               This Visit's Progress     Increase physical activity (pt-stated)        Remain active       Depression Screen     08/11/2024    8:19 AM 06/22/2024    1:44 PM 07/09/2023    7:47 AM 06/04/2023    1:28 PM 03/19/2023    8:31 AM 11/07/2022    8:44 AM 01/01/2022    7:45 AM  PHQ 2/9 Scores  PHQ - 2 Score 0 4 0 1 1 0 0  PHQ- 9 Score 0 16 0        Fall Risk     08/11/2024    8:21 AM 08/04/2024    8:09 AM 06/22/2024    1:22 PM 07/09/2023    7:47 AM 06/04/2023    1:27 PM  Fall Risk   Falls in the past year? 1 1 0 0 0  Number falls in past yr: 0 1 0 0 0  Injury with Fall? 0 1 0 0 0  Risk for fall due to : No Fall Risks   No Fall Risks   Follow up Falls evaluation completed  Falls evaluation completed;Education provided Falls evaluation completed Falls evaluation completed    MEDICARE RISK AT HOME:  Medicare Risk at Home Any stairs in or around the home?:  Yes If so, are there any without handrails?: No Home free of loose throw rugs in walkways, pet beds, electrical cords, etc?: No Adequate lighting in your home to reduce risk of falls?: Yes Life alert?: No Use of a cane, walker or w/c?: Yes Grab bars in the bathroom?: No Shower chair or bench in shower?: No Elevated toilet seat or a handicapped toilet?: Yes  TIMED UP AND GO:  Was the test performed?  No  Cognitive Function: 6CIT completed    03/26/2016    9:36 AM  MMSE - Mini Mental State Exam  Orientation to time 5   Orientation to Place 5   Registration 3   Attention/ Calculation 5   Recall 2   Language- name 2 objects 2   Language- repeat 1  Language- follow 3 step command 3   Language- read & follow direction 1   Write a sentence 1   Copy design 1   Total score 29  Data saved with a previous flowsheet row definition        08/11/2024    8:25 AM 03/19/2023    8:37 AM  6CIT Screen  What Year? 0 points 0 points  What month? 0 points 0 points  What time? 0 points 0 points  Count back from 20 0 points 0 points  Months in reverse 0 points 0 points  Repeat phrase 0 points 0 points  Total Score 0 points 0 points    Immunizations Immunization History  Administered Date(s) Administered    sv, Bivalent, Protein Subunit Rsvpref,pf Marlow) 08/26/2023   Fluad  Quad(high Dose 65+) 08/13/2019, 09/02/2020, 08/22/2021, 08/29/2022   Fluad  Trivalent(High Dose 65+) 08/16/2023   INFLUENZA, HIGH DOSE SEASONAL PF 08/25/2015, 09/04/2016, 08/23/2017, 09/08/2018   PFIZER Comirnaty Alejos Top)Covid-19 Tri-Sucrose Vaccine 03/04/2021   PFIZER(Purple Top)SARS-COV-2 Vaccination 01/08/2020, 02/02/2020, 09/07/2020   Pfizer Covid-19 Vaccine Bivalent Booster 51yrs & up 09/18/2021, 09/18/2021   Pfizer(Comirnaty )Fall Seasonal Vaccine 12 years and older 09/13/2022, 04/01/2023, 08/16/2023, 05/05/2024   Pneumococcal Conjugate-13 11/06/2016   Pneumococcal Polysaccharide-23 11/23/2010, 09/08/2018    Td 11/06/2016   Zoster Recombinant(Shingrix) 07/15/2017, 10/29/2017    Screening Tests Health Maintenance  Topic Date Due   Influenza Vaccine  07/03/2024   HEMOGLOBIN A1C  10/23/2024   Diabetic kidney evaluation - Urine ACR  01/14/2025   FOOT EXAM  04/14/2025   Diabetic kidney evaluation - eGFR measurement  04/22/2025   OPHTHALMOLOGY EXAM  07/24/2025   Medicare Annual Wellness (AWV)  08/11/2025   DTaP/Tdap/Td (2 - Tdap) 11/06/2026   Pneumococcal Vaccine: 50+ Years  Completed   COVID-19 Vaccine  Completed   Hepatitis C Screening  Completed   Zoster Vaccines- Shingrix  Completed   HPV VACCINES  Aged Out   Meningococcal B Vaccine  Aged Out   Colonoscopy  Discontinued    Health Maintenance Items Addressed:   Additional Screening:  Vision Screening: Recommended annual ophthalmology exams for early detection of glaucoma and other disorders of the eye. Is the patient up to date with their annual eye exam?  Yes Who is the provider or what is the name of the office in which the patient attends annual eye exams? Dr Marcey  Dental Screening: Recommended annual dental exams for proper oral hygiene  Community Resource Referral / Chronic Care Management: CRR required this visit?  No   CCM required this visit?  No   Plan:    I have personally reviewed and noted the following in the patient's chart:   Medical and social history Use of alcohol, tobacco or illicit drugs  Current medications and supplements including opioid prescriptions. Patient is not currently taking opioid prescriptions. Functional ability and status Nutritional status Physical activity Advanced directives List of other physicians Hospitalizations, surgeries, and ER visits in previous 12 months Vitals Screenings to include cognitive, depression, and falls Referrals and appointments  In addition, I have reviewed and discussed with patient certain preventive protocols, quality metrics, and best practice  recommendations. A written personalized care plan for preventive services as well as general preventive health recommendations were provided to patient.   Rojelio LELON Blush, LPN   [id number]   After Visit Summary: (MyChart) Due to this being a telephonic visit, the after visit summary with patients personalized plan was offered to patient via MyChart   Notes: Nothing significant to report at this time.

## 2024-08-11 NOTE — Patient Instructions (Addendum)
 Mr. Steven Payne,  Thank you for taking the time for your Medicare Wellness Visit. I appreciate your continued commitment to your health goals. Please review the care plan we discussed, and feel free to reach out if I can assist you further.  Medicare recommends these wellness visits once per year to help you and your care team stay ahead of potential health issues. These visits are designed to focus on prevention, allowing your provider to concentrate on managing your acute and chronic conditions during your regular appointments.  Please note that Annual Wellness Visits do not include a physical exam. Some assessments may be limited, especially if the visit was conducted virtually. If needed, we may recommend a separate in-person follow-up with your provider.  Ongoing Care Seeing your primary care provider every 3 to 6 months helps us  monitor your health and provide consistent, personalized care.   Referrals If a referral was made during today's visit and you haven't received any updates within two weeks, please contact the referred provider directly to check on the status.  Recommended Screenings:  Health Maintenance  Topic Date Due   Flu Shot  07/03/2024   Hemoglobin A1C  10/23/2024   Yearly kidney health urinalysis for diabetes  01/14/2025   Complete foot exam   04/14/2025   Yearly kidney function blood test for diabetes  04/22/2025   Eye exam for diabetics  07/24/2025   Medicare Annual Wellness Visit  08/11/2025   DTaP/Tdap/Td vaccine (2 - Tdap) 11/06/2026   Pneumococcal Vaccine for age over 77  Completed   COVID-19 Vaccine  Completed   Hepatitis C Screening  Completed   Zoster (Shingles) Vaccine  Completed   HPV Vaccine  Aged Out   Meningitis B Vaccine  Aged Out   Colon Cancer Screening  Discontinued       08/11/2024    8:25 AM  Advanced Directives  Does Patient Have a Medical Advance Directive? No  Would patient like information on creating a medical advance directive? No -  Patient declined   Advance Care Planning is important because it: Ensures you receive medical care that aligns with your values, goals, and preferences. Provides guidance to your family and loved ones, reducing the emotional burden of decision-making during critical moments.  Vision: Annual vision screenings are recommended for early detection of glaucoma, cataracts, and diabetic retinopathy. These exams can also reveal signs of chronic conditions such as diabetes and high blood pressure.  Dental: Annual dental screenings help detect early signs of oral cancer, gum disease, and other conditions linked to overall health, including heart disease and diabetes.  Please see the attached documents for additional preventive care recommendations.

## 2024-08-24 ENCOUNTER — Encounter: Payer: Self-pay | Admitting: Urology

## 2024-08-24 ENCOUNTER — Ambulatory Visit (INDEPENDENT_AMBULATORY_CARE_PROVIDER_SITE_OTHER): Admitting: Urology

## 2024-08-24 VITALS — BP 148/81 | HR 60 | Ht 66.0 in | Wt 242.0 lb

## 2024-08-24 DIAGNOSIS — N401 Enlarged prostate with lower urinary tract symptoms: Secondary | ICD-10-CM | POA: Diagnosis not present

## 2024-08-24 DIAGNOSIS — N529 Male erectile dysfunction, unspecified: Secondary | ICD-10-CM | POA: Diagnosis not present

## 2024-08-24 DIAGNOSIS — E291 Testicular hypofunction: Secondary | ICD-10-CM

## 2024-08-24 DIAGNOSIS — N138 Other obstructive and reflux uropathy: Secondary | ICD-10-CM

## 2024-08-24 MED ORDER — VARDENAFIL HCL 20 MG PO TABS: 20.0000 mg | ORAL_TABLET | Freq: Every day | ORAL | 11 refills | Status: AC | PRN

## 2024-08-24 MED ORDER — TESTOSTERONE 1.62 % TD GEL: 2.0000 | Freq: Every day | TRANSDERMAL | 1 refills | Status: AC

## 2024-08-24 NOTE — Progress Notes (Signed)
 Assessment: 1. Hypogonadism male   2. Organic impotence   3. BPH with obstruction/lower urinary tract symptoms     Plan: Labs today:  testosterone , CBC Continue Androgel  1.62% 2 pumps every day.  Rx sent to [facility] for vardenafil  20 mg prn Return to office in 6 months  Chief Complaint:  Chief Complaint  Patient presents with   Hypogonadism    History of Present Illness:  Steven Payne is a 78 y.o. male who is seen for continued evaluation of hypogonadism. He was previously followed by Dr. Rosalind at [facility].  He initially presented with decreased energy and decreased muscle mass as well as erectile dysfunction.  His testosterone  was found to be 89.  He was prescribed topical testosterone  replacement therapy.  He noted significant improvement in his symptoms with therapy.  He was using 1 pump daily of AndroGel .  He was last seen by Dr. Rosalind in 2/22.  He was subsequently seen by Dr. Steen at [facility] in April 2023.  At that time, he was using AndroGel  1 pump every other day and Cialis  20 mg as needed for erectile dysfunction. Labs from 4/23: PSA 0.6 H&H 14.4/43.8 Testosterone  167  He had not used AndroGel  for approximately 5 months as his prescription expired and he had been unable to get a refill.  He reported a return of his hypogonadal symptoms with decreased energy, erectile dysfunction, and difficulty losing weight.  He also reported a decrease in his libido.  He continued to have problems with erectile dysfunction.  He had not used Cialis  in some time.  This was previously working well for him.  He reported some lower urinary tract symptoms including frequency, decreased force of stream, and nocturia x 2.   No dysuria or gross hematuria.  His symptoms are not particularly bothersome for him. IPSS = 20.  He was restarted on Androgel  1.62% in March 2024. Labs from 4/24: Testosterone  207 H&H  14.4/43.1 PSA  0.9  At his visit in  10/24, he continued on AndroGel  1.62% 1 pump daily.  His symptoms were stable.  He continued with lower urinary tract symptoms including frequency, decreased stream, nocturia x 3-4.  No dysuria or gross hematuria. IPSS = 23. He has a prescription for sildenafil  but is not using this on a regular basis. Testosterone  10/24:  141 I recommended that he increase his dose to 2 pumps/day.  At his visit in March 2025, he continue on AndroGel  1.60% 1 pump daily.  He did not increase to 2 pumps per day as recommended.  He reported that his hypogonadal symptoms were stable.  He actually reported occasional morning erections.  He continued to use sildenafil  for his ED with good results. His lower urinary tract symptoms were stable with frequency, weak stream, and nocturia x 3.  No dysuria or gross hematuria. IPSS = 11/2.  Labs from 3/25: Testosterone  178 H&H  14.5/45.0 PSA  0.5  His dose of AndroGel  was increased to 2 pumps per day.  He returns today for follow-up.  He continues to use 2 pumps of AndroGel  daily.  He reports that his symptoms are fairly stable.  He is having some early morning erections.  He is requesting an alternative medication from the sildenafil  and tadalafil . His lower urinary tract symptoms are fairly stable.  He continues to have some decreased stream, nocturia x 3, and frequency.  He does associate the symptoms with his diuretic use.  No dysuria or gross hematuria.  IPSS = 17/2.   Portions of the above documentation were copied from a prior visit for review purposes only.   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   ??   HERNIA REPAIR  1990   HYDROCELE EXCISION Left 09/10/2016   Procedure: HYDROCELECTOMY ADULT;  Surgeon: Mark Ottelin, MD;  Location: [facility];  Service: Urology;  Laterality: Left;   UMBILICAL HERNIA REPAIR  1980's    Allergies:  Allergies  Allergen Reactions   Albiglutide Other (See Comments) and Itching    constipation    Family  History:  Family History  Problem Relation Age of Onset   Coronary artery disease Father    Diabetes Father    Prostate cancer Father        died at 31   Alcohol abuse Father    Coronary artery disease Mother        died age 23   Alcohol abuse Mother    Cancer Mother        unsure, + thyroid  cancer   Breast cancer Paternal Grandmother     Social History:  Social History   Tobacco Use   Smoking status: Former    Current packs/day: 0.00    Types: Cigarettes    Start date: 03/26/1989    Quit date: 03/27/1991    Years since quitting: 33.4   Smokeless tobacco: Never   Tobacco comments:    light smoker (smoked on 2 cigarettes per day for almost 2 years)  Vaping Use   Vaping status:  Never Used  Substance Use Topics   Alcohol use: Yes    Alcohol/week: 1.0 standard drink of alcohol    Types: 1 Standard drinks or equivalent per week   Drug use: No    ROS: Constitutional:  Negative for fever, chills, weight loss CV: Negative for chest pain, previous MI, hypertension Respiratory:  Negative for shortness of breath, wheezing, sleep apnea, frequent cough GI:  Negative for nausea, vomiting, bloody stool, GERD  Physical exam: BP (!) 148/81   Pulse 60   Ht 5' 6 (1.676 m)   Wt 242 lb (109.8 kg)   BMI 39.06 kg/m  GENERAL APPEARANCE:  Well appearing, well developed, well nourished, NAD HEENT:  Atraumatic, normocephalic, oropharynx clear NECK:  Supple without lymphadenopathy or thyromegaly ABDOMEN:  Soft, non-tender, no masses EXTREMITIES:  Moves all extremities well, without clubbing, cyanosis, or edema NEUROLOGIC:  Alert and oriented x 3, normal gait, CN II-XII grossly intact MENTAL STATUS:  appropriate BACK:  Non-tender to palpation, No CVAT SKIN:  Warm, dry, and intact  Results: None

## 2024-08-25 ENCOUNTER — Ambulatory Visit: Admitting: Family

## 2024-08-25 ENCOUNTER — Ambulatory Visit: Payer: Self-pay | Admitting: Urology

## 2024-08-25 ENCOUNTER — Ambulatory Visit: Payer: Self-pay | Admitting: Family

## 2024-08-25 VITALS — BP 141/77 | HR 62 | Temp 98.6°F | Resp 16 | Ht 66.0 in | Wt 242.6 lb

## 2024-08-25 DIAGNOSIS — N401 Enlarged prostate with lower urinary tract symptoms: Secondary | ICD-10-CM

## 2024-08-25 DIAGNOSIS — E782 Mixed hyperlipidemia: Secondary | ICD-10-CM | POA: Diagnosis not present

## 2024-08-25 DIAGNOSIS — F32A Depression, unspecified: Secondary | ICD-10-CM

## 2024-08-25 DIAGNOSIS — E1142 Type 2 diabetes mellitus with diabetic polyneuropathy: Secondary | ICD-10-CM | POA: Diagnosis not present

## 2024-08-25 DIAGNOSIS — E119 Type 2 diabetes mellitus without complications: Secondary | ICD-10-CM

## 2024-08-25 DIAGNOSIS — N138 Other obstructive and reflux uropathy: Secondary | ICD-10-CM | POA: Diagnosis not present

## 2024-08-25 DIAGNOSIS — Z8739 Personal history of other diseases of the musculoskeletal system and connective tissue: Secondary | ICD-10-CM

## 2024-08-25 DIAGNOSIS — G43009 Migraine without aura, not intractable, without status migrainosus: Secondary | ICD-10-CM

## 2024-08-25 DIAGNOSIS — K219 Gastro-esophageal reflux disease without esophagitis: Secondary | ICD-10-CM | POA: Diagnosis not present

## 2024-08-25 DIAGNOSIS — I10 Essential (primary) hypertension: Secondary | ICD-10-CM | POA: Diagnosis not present

## 2024-08-25 DIAGNOSIS — G4733 Obstructive sleep apnea (adult) (pediatric): Secondary | ICD-10-CM | POA: Diagnosis not present

## 2024-08-25 LAB — CBC
Hematocrit: 46.4 % (ref 37.5–51.0)
Hemoglobin: 14.6 g/dL (ref 13.0–17.7)
MCH: 26.8 pg (ref 26.6–33.0)
MCHC: 31.5 g/dL (ref 31.5–35.7)
MCV: 85 fL (ref 79–97)
Platelets: 300 x10E3/uL (ref 150–450)
RBC: 5.44 x10E6/uL (ref 4.14–5.80)
RDW: 14 % (ref 11.6–15.4)
WBC: 5.4 x10E3/uL (ref 3.4–10.8)

## 2024-08-25 LAB — BASIC METABOLIC PANEL WITH GFR
BUN: 6 mg/dL (ref 6–23)
CO2: 26 meq/L (ref 19–32)
Calcium: 9.3 mg/dL (ref 8.4–10.5)
Chloride: 103 meq/L (ref 96–112)
Creatinine, Ser: 0.66 mg/dL (ref 0.40–1.50)
GFR: 89.02 mL/min (ref >60.00)
Glucose, Bld: 93 mg/dL (ref 70–99)
Potassium: 4.3 meq/L (ref 3.5–5.1)
Sodium: 139 meq/L (ref 135–145)

## 2024-08-25 LAB — TESTOSTERONE: Testosterone: 191 ng/dL — ABNORMAL LOW (ref 264–916)

## 2024-08-25 LAB — HEMOGLOBIN A1C: Hgb A1c MFr Bld: 6.4 % (ref 4.6–6.5)

## 2024-08-25 MED ORDER — GABAPENTIN 300 MG PO CAPS: 300.0000 mg | ORAL_CAPSULE | Freq: Every day | ORAL | 1 refills | Status: DC

## 2024-08-25 MED ORDER — AMLODIPINE BESYLATE 5 MG PO TABS: 7.5000 mg | ORAL_TABLET | Freq: Every day | ORAL | 1 refills | Status: DC

## 2024-08-25 MED ORDER — COLCHICINE 0.6 MG PO TABS: 0.6000 mg | ORAL_TABLET | Freq: Every day | ORAL | 0 refills | Status: DC | PRN

## 2024-08-25 NOTE — Assessment & Plan Note (Signed)
 Continues CPAP, [facility]. Hoping for a new machine.

## 2024-08-25 NOTE — Assessment & Plan Note (Signed)
 Lab Results  Component Value Date   HGBA1C 6.1 04/22/2024   HGBA1C 6.1 01/15/2024   HGBA1C 6.1 10/07/2023   Lab Results  Component Value Date   MICROALBUR <0.7 01/15/2024   LDLCALC 94 01/15/2024   CREATININE 0.72 04/22/2024   Stable on jardiance/metformin . Update A1C.

## 2024-08-25 NOTE — Patient Instructions (Signed)
 VISIT SUMMARY:  Today, we discussed your worsening headaches, which have been occurring more frequently over the past six months. We also reviewed your current medications and made some adjustments to better manage your conditions, including hypertension and gout. Additionally, we discussed your upcoming sleep study and the new CPAP machine for your sleep apnea.  YOUR PLAN:  MIGRAINE HEADACHE: You have been experiencing headaches 3-4 times a month with symptoms like sensitivity to light and sound, nausea, and runny nose. A CT scan showed no abnormalities. -Continue taking Tylenol  and gabapentin  as needed for headache relief.  HYPERTENSION: Your blood pressure is slightly elevated. -Increase your amlodipine  dose to 7.5 mg daily.  CHRONIC NEUROPATHIC PAIN: Your chronic pain is manageable with gabapentin  and psychological support. -Continue taking gabapentin . -Continue with psychological support.  GASTROESOPHAGEAL REFLUX DISEASE (GERD): Your GERD symptoms are well-controlled with Pepcid  and pantoprazole . -Continue taking Pepcid  and pantoprazole .  TYPE 2 DIABETES MELLITUS: Your blood sugar levels are well-controlled with an A1c of 6.1. -Continue taking Jardiance and metformin . -We will update your A1c in your medical records.  HYPERLIPIDEMIA: Your cholesterol levels are normal. -Continue taking Lipitor.  OBSTRUCTIVE SLEEP APNEA: You are awaiting a new CPAP machine and have a sleep study scheduled next month. -Await the new CPAP machine and sleep study results.  GOUT: You have not had any recent gout attacks, but your colchicine  supply is outdated. -We will prescribe new colchicine .

## 2024-08-25 NOTE — Progress Notes (Signed)
 Subjective:     Patient ID: Steven Payne, male    DOB: August 25, 1946, 78 y.o.   MRN: [id number]  Chief Complaint  Patient presents with   Hypertension    Here for follow up     Diabetes    Here for follow up    Headache    Patient complains of headaches on the right side of head, light and sound sensitivity     Hypertension Associated symptoms include headaches.  Diabetes Hypoglycemia symptoms include headaches.  Headache  His past medical history is significant for hypertension.    Discussed the use of AI scribe software for clinical note transcription with the patient, who gave verbal consent to proceed.  History of Present Illness  Steven Payne is a 78 year old male who presents with worsening headache symptoms.  He has experienced headaches for six months, increasing in frequency to three or four times a month. Symptoms include photophobia, phonophobia, nausea, and rhinorrhea. There is no prior history of migraines or frequent headaches. He uses over-the-counter Tylenol  with uncertain effectiveness and finds gabapentin  aids sleep post-pain. A CT scan of the head at the [place] showed no abnormalities. Pepcid , taken for GERD, seems to alleviate nausea. He is awaiting a new CPAP machine for sleep apnea, with a sleep study scheduled next month. He maintains a headache log to identify triggers. Current medications include Lipitor, amlodipine , hydrochlorothiazide , Entresto , gabapentin , Jardiance, and metformin . He experiences frequent urination, especially at night, affecting sleep.     Health Maintenance Due  Topic Date Due   Influenza Vaccine  07/03/2024    Past Medical History:  Diagnosis Date   Anxiety 2019   Arthritis    Ascending aorta dilatation 07/19/2021   Asymmetrical sensorineural hearing loss 01/01/2023   Atherosclerosis of native coronary artery of native heart with angina pectoris 02/23/2016   Benign paroxysmal positional vertigo due to bilateral  vestibular disorder 02/06/2023   BPH with obstruction/lower urinary tract symptoms 02/18/2023   Cataract 2022   Coronary artery disease cardiologist-  dr dann   per cardiac cath 06/ 2013--  no sig. 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   ??   HERNIA REPAIR  1990   HYDROCELE EXCISION Left 09/10/2016   Procedure: HYDROCELECTOMY ADULT;  Surgeon: Mark Ottelin, MD;  Location: [facility];  Service: Urology;  Laterality: Left;   UMBILICAL HERNIA REPAIR  1980's    Family History  Problem Relation Age of Onset   Coronary artery disease Father    Diabetes  Father    Prostate cancer Father        died at 48   Alcohol abuse Father    Coronary artery disease Mother        died age 27   Alcohol abuse Mother    Cancer Mother        unsure, + thyroid  cancer   Breast cancer Paternal Grandmother     Social History   Socioeconomic History   Marital status: Married    Spouse name: Not on file   Number of children: Not on file   Years of education: Not on file   Highest education level: Bachelor's degree (e.g., BA, AB, BS)  Occupational History   Not on file  Tobacco Use   Smoking status: Former    Current packs/day: 0.00    Types: Cigarettes    Start date: 03/26/1989    Quit date: 03/27/1991    Years since quitting: 33.4   Smokeless tobacco: Never   Tobacco comments:    light smoker (smoked on 2 cigarettes per day for almost 2 years)  Vaping Use   Vaping status: Never Used  Substance and Sexual Activity   Alcohol use: Yes    Alcohol/week: 1.0 standard drink of alcohol    Types: 1 Standard drinks or equivalent per week   Drug use: No   Sexual activity: Yes  Other Topics Concern   Not on file  Social History Narrative   Retired Electronics engineer, 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  No grandchildren.  Oldest in [place], youngest in [place]   Completed bachelors, some graduate (Actuary)   Enjoys travelling, walking, cruises   Social Drivers of Corporate investment banker Strain: Low Risk  (08/11/2024)   Overall Financial Resource Strain (CARDIA)    Difficulty of Paying Living Expenses: Not hard at all  Food Insecurity: No Food Insecurity (08/11/2024)   Hunger Vital Sign    Worried About Running Out of Food in the Last Year: Never true    Ran Out of Food in the Last Year: Never true  Transportation Needs: No Transportation Needs (08/11/2024)   PRAPARE - Administrator, Civil Service (Medical): No    Lack of Transportation  (Non-Medical): No  Physical Activity: Insufficiently Active (08/11/2024)   Exercise Vital Sign    Days of Exercise per Week: 3 days    Minutes of Exercise per Session: 30 min  Stress: No Stress Concern Present (08/11/2024)   [employer] of Occupational Health - Occupational Stress Questionnaire    Feeling of Stress: Not at all  Recent Concern: Stress - Stress Concern Present (06/22/2024)   [employer] of Occupational Health - Occupational Stress Questionnaire    Feeling of Stress: Rather much  Social Connections: Socially Integrated (08/11/2024)   Social Connection and Isolation Panel    Frequency of Communication with Friends and Family: More than three times a week    Frequency of  Social Gatherings with Friends and Family: More than three times a week    Attends Religious Services: More than 4 times per year    Active Member of [employer] or Organizations: Yes    Attends Engineer, structural: More than 4 times per year    Marital Status: Married  Catering manager Violence: Not At Risk (08/11/2024)   Humiliation, Afraid, Rape, and Kick questionnaire    Fear of Current or Ex-Partner: No    Emotionally Abused: No    Physically Abused: No    Sexually Abused: No    Outpatient Medications Prior to Visit  Medication Sig Dispense Refill   atorvastatin  (LIPITOR) 80 MG tablet Take 1 tablet (80 mg total) by mouth daily. 90 tablet 1   clobetasol cream (TEMOVATE) 0.05 % Apply 1 application topically daily.     clopidogrel  (PLAVIX ) 75 MG tablet Take 1 tablet (75 mg total) by mouth daily. 90 tablet 1   Empagliflozin-metFORMIN  HCl ER 04-999 MG TB24 Take by mouth.     ezetimibe  (ZETIA ) 10 MG tablet Take 1 tablet (10 mg total) by mouth daily. 90 tablet 2   famotidine  (PEPCID ) 20 MG tablet Take 1 tablet (20 mg total) by mouth 2 (two) times daily. 180 tablet 1   hydrochlorothiazide  (HYDRODIURIL ) 25 MG tablet TAKE 1 TABLET DAILY. 90 tablet 3   Lancets (FREESTYLE) lancets TEST UP TO FOUR TIMES  DAILY 300 each 3   pantoprazole  (PROTONIX ) 40 MG tablet Take 1 tablet (40 mg total) by mouth daily. 30 tablet 0   RESTASIS 0.05 % ophthalmic emulsion Place 1 drop into both eyes daily.     sacubitril -valsartan  (ENTRESTO ) 24-26 MG Take 1 tablet by mouth 2 (two) times daily. Needs appointment for future refills 180 tablet 3   sertraline (ZOLOFT) 100 MG tablet Take by mouth.     Testosterone  1.62 % GEL Place 2 Pump onto the skin daily. 225 g 1   vardenafil  (LEVITRA ) 20 MG tablet Take 1 tablet (20 mg total) by mouth daily as needed for erectile dysfunction. 10 tablet 11   amLODipine  (NORVASC ) 5 MG tablet Take 1 tablet (5 mg total) by mouth daily. 90 tablet 1   colchicine  0.6 MG tablet Take 1 tablet (0.6 mg total) by mouth daily as needed (Gout). gout 20 tablet 0   gabapentin  (NEURONTIN ) 300 MG capsule Take 1 capsule (300 mg total) by mouth at bedtime. 90 capsule 1   No facility-administered medications prior to visit.    Allergies  Allergen Reactions   Albiglutide Other (See Comments) and Itching    constipation    Review of Systems  Neurological:  Positive for headaches.       Objective:    Physical Exam Constitutional:      General: He is not in acute distress.    Appearance: He is well-developed.  HENT:     Head: Normocephalic and atraumatic.  Cardiovascular:     Rate and Rhythm: Normal rate and regular rhythm.     Heart sounds: No murmur heard. Pulmonary:     Effort: Pulmonary effort is normal. No respiratory distress.     Breath sounds: Normal breath sounds. No wheezing or rales.  Skin:    General: Skin is warm and dry.  Neurological:     Mental Status: He is alert and oriented to person, place, and time.  Psychiatric:        Behavior: Behavior normal.        Thought Content: Thought content normal.  BP (!) 141/77 (BP Location: Right Arm, Patient Position: Sitting, Cuff Size: Large)   Pulse 62   Temp 98.6 F (37 C) (Oral)   Resp 16   Ht 5' 6 (1.676 m)    Wt 242 lb 9.6 oz (110 kg)   SpO2 100%   BMI 39.16 kg/m  Wt Readings from Last 3 Encounters:  08/25/24 242 lb 9.6 oz (110 kg)  08/24/24 242 lb (109.8 kg)  08/11/24 239 lb (108.4 kg)       Assessment & Plan:   Problem List Items Addressed This Visit       Unprioritized   OSA (obstructive sleep apnea)   Continues CPAP, [facility]. Hoping for a new machine.       Migraine without aura and without status migrainosus, not intractable - Primary    Headaches occur 3-4 times monthly with photophobia, phonophobia, nausea, and rhinorrhea. CT scan reportedly normal at the [facility] with Tylenol , gabapentin , and Pepcid . - Continue Tylenol  and gabapentin .       Relevant Medications   amLODipine  (NORVASC ) 5 MG tablet   colchicine  0.6 MG tablet   gabapentin  (NEURONTIN ) 300 MG capsule   Hyperlipidemia   Lab Results  Component Value Date   CHOL 163 01/15/2024   HDL 42.90 01/15/2024   LDLCALC 94 01/15/2024   TRIG 130.0 01/15/2024   CHOLHDL 4 01/15/2024   Stable on lipitor 80mg . Continue same.      Relevant Medications   amLODipine  (NORVASC ) 5 MG tablet   HTN (hypertension)   BP Readings from Last 3 Encounters:  08/25/24 (!) 141/77  08/24/24 (!) 148/81  06/22/24 138/86   BP mildly elevated today.  Will increase amlodipine  from 5mg  to 7.5 mg.       Relevant Medications   amLODipine  (NORVASC ) 5 MG tablet   History of gout   Stable on colchicine  prn.       Relevant Medications   colchicine  0.6 MG tablet   GERD (gastroesophageal reflux disease)   Stable on pepid and pantoprazole .       Diabetic polyneuropathy associated with type 2 diabetes mellitus (HCC)   Finds some improvement with HS Gabapentin . Continue same.       Relevant Medications   gabapentin  (NEURONTIN ) 300 MG capsule   Diabetes type 2, controlled (HCC)   Lab Results  Component Value Date   HGBA1C 6.1 04/22/2024   HGBA1C 6.1 01/15/2024   HGBA1C 6.1 10/07/2023    Lab Results  Component Value Date   MICROALBUR <0.7 01/15/2024   LDLCALC 94 01/15/2024   CREATININE 0.72 04/22/2024   Stable on jardiance/metformin . Update A1C.       Relevant Orders   HgB A1c   Basic Metabolic Panel (BMET)   Depression   Stable on zoloft 100mg  once daily       BPH with obstruction/lower urinary tract symptoms   Voiding without difficulty.        I have changed Steven Payne's amLODipine . I am also having him maintain his freestyle, clobetasol cream, Restasis, hydrochlorothiazide , Entresto , ezetimibe , famotidine , clopidogrel , atorvastatin , Empagliflozin-metFORMIN  HCl ER, sertraline, pantoprazole , Testosterone , vardenafil , colchicine , and gabapentin .  Meds ordered this encounter  Medications   amLODipine  (NORVASC ) 5 MG tablet    Sig: Take 1.5 tablets (7.5 mg total) by mouth daily.    Dispense:  135 tablet    Refill:  1    Supervising Provider:   DOMENICA BLACKBIRD A [[id number]]  colchicine  0.6 MG tablet    Sig: Take 1 tablet (0.6 mg total) by mouth daily as needed (Gout). gout    Dispense:  20 tablet    Refill:  0    Supervising Provider:   DOMENICA BLACKBIRD A [[id number]]   gabapentin  (NEURONTIN ) 300 MG capsule    Sig: Take 1 capsule (300 mg total) by mouth at bedtime.    Dispense:  90 capsule    Refill:  1    Supervising Provider:   DOMENICA BLACKBIRD A [[id number]]

## 2024-08-25 NOTE — Assessment & Plan Note (Signed)
 BP Readings from Last 3 Encounters:  08/25/24 (!) 141/77  08/24/24 (!) 148/81  06/22/24 138/86   BP mildly elevated today.  Will increase amlodipine  from 5mg  to 7.5 mg.

## 2024-08-25 NOTE — Assessment & Plan Note (Signed)
  Headaches occur 3-4 times monthly with photophobia, phonophobia, nausea, and rhinorrhea. CT scan reportedly normal at the [facility] with Tylenol , gabapentin , and Pepcid . - Continue Tylenol  and gabapentin .

## 2024-08-25 NOTE — Assessment & Plan Note (Signed)
 Stable on colchicine  prn.

## 2024-08-25 NOTE — Assessment & Plan Note (Signed)
 Finds some improvement with HS Gabapentin . Continue same.

## 2024-08-25 NOTE — Assessment & Plan Note (Signed)
 Stable on pepid and pantoprazole .

## 2024-08-25 NOTE — Assessment & Plan Note (Signed)
 Stable on zoloft 100mg  once daily

## 2024-08-25 NOTE — Assessment & Plan Note (Signed)
Voiding without difficulty

## 2024-08-25 NOTE — Assessment & Plan Note (Signed)
 Lab Results  Component Value Date   CHOL 163 01/15/2024   HDL 42.90 01/15/2024   LDLCALC 94 01/15/2024   TRIG 130.0 01/15/2024   CHOLHDL 4 01/15/2024   Stable on lipitor 80mg . Continue same.

## 2024-09-07 ENCOUNTER — Other Ambulatory Visit (HOSPITAL_COMMUNITY): Payer: Self-pay

## 2024-09-07 DIAGNOSIS — R519 Headache, unspecified: Secondary | ICD-10-CM

## 2024-09-10 ENCOUNTER — Ambulatory Visit (HOSPITAL_COMMUNITY)
Admission: RE | Admit: 2024-09-10 | Discharge: 2024-09-10 | Disposition: A | Discharge status: Home / Self Care | Source: Ambulatory Visit | Attending: Neuroradiology | Admitting: Neuroradiology

## 2024-09-10 ENCOUNTER — Other Ambulatory Visit (HOSPITAL_BASED_OUTPATIENT_CLINIC_OR_DEPARTMENT_OTHER): Payer: Self-pay

## 2024-09-10 DIAGNOSIS — R519 Headache, unspecified: Secondary | ICD-10-CM | POA: Diagnosis not present

## 2024-09-10 DIAGNOSIS — Z23 Encounter for immunization: Secondary | ICD-10-CM | POA: Diagnosis not present

## 2024-09-10 MED ORDER — COMIRNATY 30 MCG/0.3ML IM SUSY
0.3000 mL | PREFILLED_SYRINGE | Freq: Once | INTRAMUSCULAR | 0 refills | Status: AC
  Filled 2024-09-10: qty 0.3, 1d supply, fill #0

## 2024-09-10 MED ORDER — FLUZONE HIGH-DOSE 0.5 ML IM SUSY
0.5000 mL | PREFILLED_SYRINGE | Freq: Once | INTRAMUSCULAR | 0 refills | Status: AC
  Filled 2024-09-10: qty 0.5, 1d supply, fill #0

## 2024-12-25 ENCOUNTER — Ambulatory Visit: Admitting: Family

## 2024-12-25 ENCOUNTER — Ambulatory Visit: Payer: Self-pay | Admitting: Family

## 2024-12-25 VITALS — BP 130/61 | HR 58 | Temp 97.8°F | Resp 16 | Ht 66.0 in | Wt 249.0 lb

## 2024-12-25 DIAGNOSIS — I10 Essential (primary) hypertension: Secondary | ICD-10-CM | POA: Diagnosis not present

## 2024-12-25 DIAGNOSIS — K219 Gastro-esophageal reflux disease without esophagitis: Secondary | ICD-10-CM

## 2024-12-25 DIAGNOSIS — E782 Mixed hyperlipidemia: Secondary | ICD-10-CM

## 2024-12-25 DIAGNOSIS — F32A Depression, unspecified: Secondary | ICD-10-CM

## 2024-12-25 DIAGNOSIS — E1142 Type 2 diabetes mellitus with diabetic polyneuropathy: Secondary | ICD-10-CM | POA: Diagnosis not present

## 2024-12-25 DIAGNOSIS — N138 Other obstructive and reflux uropathy: Secondary | ICD-10-CM | POA: Diagnosis not present

## 2024-12-25 DIAGNOSIS — E785 Hyperlipidemia, unspecified: Secondary | ICD-10-CM

## 2024-12-25 DIAGNOSIS — G4733 Obstructive sleep apnea (adult) (pediatric): Secondary | ICD-10-CM | POA: Diagnosis not present

## 2024-12-25 DIAGNOSIS — Z8739 Personal history of other diseases of the musculoskeletal system and connective tissue: Secondary | ICD-10-CM | POA: Diagnosis not present

## 2024-12-25 DIAGNOSIS — N401 Enlarged prostate with lower urinary tract symptoms: Secondary | ICD-10-CM

## 2024-12-25 DIAGNOSIS — E088 Diabetes mellitus due to underlying condition with unspecified complications: Secondary | ICD-10-CM

## 2024-12-25 LAB — COMPREHENSIVE METABOLIC PANEL WITH GFR
ALT: 6 U/L (ref 3–53)
AST: 13 U/L (ref 5–37)
Albumin: 4.3 g/dL (ref 3.5–5.2)
Alkaline Phosphatase: 76 U/L (ref 39–117)
BUN: 9 mg/dL (ref 6–23)
CO2: 29 meq/L (ref 19–32)
Calcium: 9.4 mg/dL (ref 8.4–10.5)
Chloride: 101 meq/L (ref 96–112)
Creatinine, Ser: 0.68 mg/dL (ref 0.40–1.50)
GFR: 88.01 mL/min
Glucose, Bld: 88 mg/dL (ref 70–99)
Potassium: 3.8 meq/L (ref 3.5–5.1)
Sodium: 139 meq/L (ref 135–145)
Total Bilirubin: 1.1 mg/dL (ref 0.2–1.2)
Total Protein: 7.1 g/dL (ref 6.0–8.3)

## 2024-12-25 LAB — LIPID PANEL
Cholesterol: 128 mg/dL (ref 28–200)
HDL: 42.1 mg/dL
LDL Cholesterol: 62 mg/dL (ref 10–99)
NonHDL: 85.53
Total CHOL/HDL Ratio: 3
Triglycerides: 116 mg/dL (ref 10.0–149.0)
VLDL: 23.2 mg/dL (ref 0.0–40.0)

## 2024-12-25 MED ORDER — CLOPIDOGREL BISULFATE 75 MG PO TABS: 75.0000 mg | ORAL_TABLET | Freq: Every day | ORAL | 1 refills | Status: AC

## 2024-12-25 MED ORDER — AMLODIPINE BESYLATE 5 MG PO TABS: 7.5000 mg | ORAL_TABLET | Freq: Every day | ORAL | 1 refills | Status: AC

## 2024-12-25 MED ORDER — FAMOTIDINE 20 MG PO TABS: 20.0000 mg | ORAL_TABLET | Freq: Two times a day (BID) | ORAL | 1 refills | Status: AC

## 2024-12-25 MED ORDER — HYDROCHLOROTHIAZIDE 25 MG PO TABS: 25.0000 mg | ORAL_TABLET | Freq: Every day | ORAL | 1 refills | Status: AC

## 2024-12-25 MED ORDER — GABAPENTIN 300 MG PO CAPS: 300.0000 mg | ORAL_CAPSULE | Freq: Every day | ORAL | 1 refills | Status: AC

## 2024-12-25 MED ORDER — SERTRALINE HCL 100 MG PO TABS: 100.0000 mg | ORAL_TABLET | Freq: Every day | ORAL | 1 refills | Status: AC

## 2024-12-25 MED ORDER — COLCHICINE 0.6 MG PO TABS: 0.6000 mg | ORAL_TABLET | Freq: Every day | ORAL | 0 refills | Status: AC | PRN

## 2024-12-25 MED ORDER — ATORVASTATIN CALCIUM 80 MG PO TABS: 80.0000 mg | ORAL_TABLET | Freq: Every day | ORAL | 1 refills | Status: AC

## 2024-12-25 MED ORDER — EZETIMIBE 10 MG PO TABS: 10.0000 mg | ORAL_TABLET | Freq: Every day | ORAL | 1 refills | Status: AC

## 2024-12-25 NOTE — Assessment & Plan Note (Signed)
 Lab Results  Component Value Date   CHOL 163 01/15/2024   HDL 42.90 01/15/2024   LDLCALC 94 01/15/2024   TRIG 130.0 01/15/2024   CHOLHDL 4 01/15/2024   Update lipids, continue entresto .

## 2024-12-25 NOTE — Assessment & Plan Note (Signed)
 Stable on pantoprazole prn

## 2024-12-25 NOTE — Assessment & Plan Note (Signed)
 He has nocturia 2-3 times at night. Voiding without difficulty.

## 2024-12-25 NOTE — Progress Notes (Signed)
 "  Subjective:     Patient ID: Steven Payne, male    DOB: 1946/01/28, 79 y.o.   MRN: [id number]  Chief Complaint  Patient presents with   Hypertension    Here for follow up   Diabetes    Here for follow up    HPI  Discussed the use of AI scribe software for clinical note transcription with the patient, who gave verbal consent to proceed.  History of Present Illness Steven Payne is an 79 year old male who presents for follow-up on his mental health and medication management.  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 He describes feelings of being 'useless' and 'drifting' without purpose. His son plans to change the business name, which has been distressing for him. He is currently taking sertraline  for depression and anxiety.  He has difficulty sleeping and has been attending sleep therapy. He often falls asleep during the day, especially when watching TV or reading, and uses a CPAP machine at night and sometimes during the day.  He urinates two to three times a night and has increased water intake as part of a weight loss effort. 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  He experiences occasional bloating but no longer takes pantoprazole  frequently as he rarely experiences heartburn.  He notes some memory issues, with his wife commenting on him repeating himself. He is adjusting to spending more time at home with his wife after retiring.  He is taking Lipitor for cholesterol management and reports no recent gout attacks, though he keeps colchicine  on hand.      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   ??   HERNIA REPAIR  1990   HYDROCELE EXCISION Left 09/10/2016   Procedure: HYDROCELECTOMY ADULT;  Surgeon: Mark Ottelin, MD;  Location:  [facility];  Service: Urology;  Laterality: Left;   UMBILICAL HERNIA REPAIR  1980's    Family History  Problem Relation Age of Onset   Coronary artery disease Father    Diabetes Father    Prostate cancer Father        died at 2   Alcohol abuse Father    Coronary artery disease Mother        died age 65   Alcohol abuse Mother    Cancer Mother        unsure, + thyroid  cancer   Breast cancer Paternal Grandmother     Social History   Socioeconomic History   Marital status: Married    Spouse name: Not on file   Number of children: Not on file   Years of education: Not on file   Highest education level: Bachelor's degree (e.g., BA, AB, BS)  Occupational History   Not on file  Tobacco Use   Smoking status: Former    Current packs/day: 0.00    Types: Cigarettes    Start date: 03/26/1989    Quit date: 03/27/1991    Years since quitting: 33.7   Smokeless tobacco: Never   Tobacco comments:    light smoker (smoked on 2 cigarettes per day for almost 2 years)  Vaping Use   Vaping status: Never Used  Substance and Sexual Activity   Alcohol use: Yes    Alcohol/week: 1.0 standard drink of alcohol    Types: 1 Standard drinks or equivalent per week   Drug use: No   Sexual activity: Yes  Other Topics Concern   Not on file  Social History Narrative   Retired Electronics Engineer, 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  No grandchildren.  Oldest in [place], youngest in [place]   Completed bachelors, some graduate (actuary)   Enjoys travelling, walking, cruises   Social Drivers of Health   Tobacco Use: Medium Risk (08/24/2024)   Patient History    Smoking Tobacco Use: Former    Smokeless Tobacco Use: Never    Passive Exposure: Not on file  Financial Resource Strain: Low Risk (12/19/2024)   Overall Financial Resource Strain (CARDIA)    Difficulty of Paying Living Expenses: Not  very hard  Food Insecurity: No Food Insecurity (12/19/2024)   Epic    Worried About Programme Researcher, Broadcasting/film/video in the Last Year: Never true    Ran Out of Food in the Last Year: Never true  Transportation Needs: No Transportation Needs (12/19/2024)   Epic    Lack of Transportation (Medical): No    Lack of Transportation (Non-Medical): No  Physical Activity: Insufficiently Active (12/19/2024)   Exercise Vital Sign    Days of Exercise per Week: 3 days    Minutes of Exercise per Session: 30 min  Stress: Stress Concern Present (12/19/2024)   [employer] of  Occupational Health - Occupational Stress Questionnaire    Feeling of Stress: Very much  Social Connections: Socially Integrated (12/19/2024)   Social Connection and Isolation Panel    Frequency of Communication with Friends and Family: More than three times a week    Frequency of Social Gatherings with Friends and Family: Once a week    Attends Religious Services: More than 4 times per year    Active Member of Clubs or Organizations: Yes    Attends Banker Meetings: More than 4 times per year    Marital Status: Married  Catering Manager Violence: Not At Risk (08/11/2024)   Epic    Fear of Current or Ex-Partner: No    Emotionally Abused: No    Physically Abused: No    Sexually Abused: No  Depression (PHQ2-9): High Risk (12/25/2024)   Depression (PHQ2-9)    PHQ-2 Score: 13  Alcohol Screen: Low Risk (08/11/2024)   Alcohol Screen    Last Alcohol Screening Score (AUDIT): 0  Housing: Low Risk (12/19/2024)   Epic    Unable to Pay for Housing in the Last Year: No    Number of Times Moved in the Last Year: 0    Homeless in the Last Year: No  Utilities: Not At Risk (08/11/2024)   Epic    Threatened with loss of utilities: No  Health Literacy: Adequate Health Literacy (08/11/2024)   B1300 Health Literacy    Frequency of need for help with medical instructions: Never    Outpatient Medications Prior to Visit  Medication Sig Dispense  Refill   clobetasol cream (TEMOVATE) 0.05 % Apply 1 application topically daily.     Empagliflozin-metFORMIN  HCl ER 04-999 MG TB24 Take by mouth.     Lancets (FREESTYLE) lancets TEST UP TO FOUR TIMES DAILY 300 each 3   pantoprazole  (PROTONIX ) 40 MG tablet Take 1 tablet (40 mg total) by mouth daily. 30 tablet 0   RESTASIS 0.05 % ophthalmic emulsion Place 1 drop into both eyes daily.     sacubitril -valsartan  (ENTRESTO ) 24-26 MG Take 1 tablet by mouth 2 (two) times daily. Needs appointment for future refills 180 tablet 3   Testosterone  1.62 % GEL Place 2 Pump onto the skin daily. 225 g 1   vardenafil  (LEVITRA ) 20 MG tablet Take 1 tablet (20 mg total) by mouth daily as needed for erectile dysfunction. 10 tablet 11   amLODipine  (NORVASC ) 5 MG tablet Take 1.5 tablets (7.5 mg total) by mouth daily. 135 tablet 1   atorvastatin  (LIPITOR) 80 MG tablet Take 1 tablet (80 mg total) by mouth daily. 90 tablet 1   clopidogrel  (PLAVIX ) 75 MG tablet Take 1 tablet (75 mg total) by mouth daily. 90 tablet 1   colchicine  0.6 MG tablet Take 1 tablet (0.6 mg total) by mouth daily as needed (Gout). gout 20 tablet 0   ezetimibe  (ZETIA ) 10 MG tablet Take 1 tablet (10 mg total) by mouth daily. 90 tablet 2   famotidine  (PEPCID ) 20 MG tablet Take 1 tablet (20 mg total) by mouth 2 (two) times daily. 180 tablet 1   gabapentin  (NEURONTIN ) 300 MG capsule Take 1 capsule (300 mg total) by mouth at bedtime. 90 capsule 1   hydrochlorothiazide  (HYDRODIURIL ) 25 MG tablet TAKE 1 TABLET DAILY. 90 tablet 3   sertraline  (ZOLOFT ) 100 MG tablet Take by mouth.     No facility-administered medications prior to visit.    Allergies[1]  ROS See HPI    Objective:    Physical Exam  Constitutional:      General: He is not in acute distress.    Appearance: He is well-developed.  HENT:     Head: Normocephalic and atraumatic.  Cardiovascular:     Rate and Rhythm: Normal rate and regular rhythm.     Heart sounds: No murmur  heard. Pulmonary:     Effort: Pulmonary effort is normal. No respiratory distress.     Breath sounds: Normal breath sounds. No wheezing or rales.  Skin:    General: Skin is warm and dry.  Neurological:     Mental Status: He is alert and oriented to person, place, and time.  Psychiatric:        Behavior: Behavior normal.        Thought Content: Thought content normal.      BP 130/61 (BP Location: Right Arm, Patient Position: Sitting, Cuff Size: Large)   Pulse (!) 58   Temp 97.8 F (36.6 C) (Oral)   Resp 16   Ht 5' 6 (1.676 m)   Wt 249 lb (112.9 kg)   SpO2 100%   BMI 40.19 kg/m  Wt Readings from Last 3 Encounters:  12/25/24 249 lb (112.9 kg)  08/25/24 242 lb 9.6 oz (110 kg)  08/24/24 242 lb (109.8 kg)       Assessment & Plan:   Problem List Items Addressed This Visit       Unprioritized   OSA (obstructive sleep apnea)   Reports good compliance with CPAP.        Hyperlipidemia   Lab Results  Component Value Date   CHOL 163 01/15/2024   HDL 42.90 01/15/2024   LDLCALC 94 01/15/2024   TRIG 130.0 01/15/2024   CHOLHDL 4 01/15/2024   Update lipids, continue entresto .       Relevant Medications   atorvastatin  (LIPITOR) 80 MG tablet   amLODipine  (NORVASC ) 5 MG tablet   ezetimibe  (ZETIA ) 10 MG tablet   hydrochlorothiazide  (HYDRODIURIL ) 25 MG tablet   Other Relevant Orders   Lipid panel   Comp Met (CMET)   HTN (hypertension)   BP Readings from Last 3 Encounters:  12/25/24 130/61  08/25/24 (!) 141/77  08/24/24 (!) 148/81   BP stable on hydrochlorothiazide  and entresto .        Relevant Medications   atorvastatin  (LIPITOR) 80 MG tablet   amLODipine  (NORVASC ) 5 MG tablet   ezetimibe  (ZETIA ) 10 MG tablet   hydrochlorothiazide  (HYDRODIURIL ) 25 MG tablet   History of gout   Has not bothered him recently. Will refill colchicine  to have on hand.       Relevant Medications   colchicine  0.6 MG tablet   GERD (gastroesophageal reflux disease)   Stable on  pantoprazole  prn.        Relevant Medications   famotidine  (PEPCID ) 20 MG tablet   Diabetic polyneuropathy associated with type 2 diabetes mellitus (HCC)   Relevant Medications   gabapentin  (NEURONTIN ) 300 MG capsule   atorvastatin  (LIPITOR) 80 MG tablet   sertraline  (ZOLOFT ) 100 MG tablet   Diabetes mellitus due to underlying condition with unspecified complications [facility])   Lab Results  Component Value Date   HGBA1C 6.4 08/25/2024   HGBA1C 6.1 04/22/2024   HGBA1C 6.1 01/15/2024   Lab Results  Component Value Date   MICROALBUR <0.7 01/15/2024   LDLCALC 94 01/15/2024   CREATININE 0.66 08/25/2024   Update A1C. 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  He continues to take lexapro. We discussed looking into getting involved in some community based interests to stay busy and keep a routine.  We also discussed him getting established with a counselor at the [facility] which he plans to do.       Relevant Medications   sertraline  (ZOLOFT ) 100 MG tablet   BPH with obstruction/lower urinary tract symptoms - Primary   He has nocturia 2-3 times at night. Voiding without difficulty.       I have changed Steven Payne Andy's hydrochlorothiazide  and sertraline . I am also having him maintain his freestyle, clobetasol cream, Restasis, Entresto , Empagliflozin-metFORMIN  HCl ER, pantoprazole , Testosterone , vardenafil , gabapentin , famotidine , atorvastatin , colchicine , amLODipine , clopidogrel , and ezetimibe .  Meds ordered this encounter  Medications   gabapentin  (NEURONTIN ) 300 MG capsule    Sig: Take 1 capsule (300 mg total) by mouth at bedtime.    Dispense:  90 capsule    Refill:  1    Supervising Provider:   DOMENICA BLACKBIRD A [[id number]]   famotidine  (PEPCID ) 20 MG tablet    Sig: Take 1 tablet (20 mg total) by mouth 2 (two) times  daily.    Dispense:  180 tablet    Refill:  1    Supervising Provider:   DOMENICA BLACKBIRD A [[id number]]   atorvastatin  (LIPITOR) 80 MG tablet    Sig: Take 1 tablet (80 mg total) by mouth daily.    Dispense:  90 tablet    Refill:  1    Supervising Provider:   DOMENICA BLACKBIRD A [[id number]]   colchicine  0.6 MG tablet    Sig: Take 1 tablet (0.6 mg total) by mouth daily as needed (Gout). gout    Dispense:  20 tablet    Refill:  0    Supervising Provider:   DOMENICA BLACKBIRD A [[id number]]   amLODipine  (NORVASC ) 5 MG tablet    Sig: Take 1.5 tablets (7.5 mg total) by mouth daily.    Dispense:  135 tablet    Refill:  1    Supervising Provider:   DOMENICA BLACKBIRD A [[id number]]   clopidogrel  (PLAVIX ) 75 MG tablet    Sig: Take 1 tablet (75 mg total) by mouth daily.    Dispense:  90 tablet    Refill:  1    Supervising Provider:   DOMENICA BLACKBIRD A [[id number]]   ezetimibe  (ZETIA ) 10 MG tablet    Sig: Take 1 tablet (10 mg total) by mouth daily.    Dispense:  90 tablet    Refill:  1    Supervising Provider:   DOMENICA BLACKBIRD A [[id number]]   hydrochlorothiazide  (HYDRODIURIL ) 25 MG tablet    Sig: Take 1 tablet (25 mg total) by mouth daily.    Dispense:  90 tablet    Refill:  1    Supervising Provider:   DOMENICA BLACKBIRD A [[id number]]   sertraline  (ZOLOFT ) 100 MG tablet    Sig: Take 1 tablet (100 mg total) by mouth daily.    Dispense:  90 tablet    Refill:  1    Supervising Provider:   DOMENICA BLACKBIRD A [[id number]]      [1]  Allergies Allergen Reactions   Albiglutide Other (See Comments) and Itching    constipation   "

## 2024-12-25 NOTE — Assessment & Plan Note (Signed)
Reports good compliance with CPAP 

## 2024-12-25 NOTE — Assessment & Plan Note (Signed)
 Lab Results  Component Value Date   HGBA1C 6.4 08/25/2024   HGBA1C 6.1 04/22/2024   HGBA1C 6.1 01/15/2024   Lab Results  Component Value Date   MICROALBUR <0.7 01/15/2024   LDLCALC 94 01/15/2024   CREATININE 0.66 08/25/2024   Update A1C. Last A1c 6.2

## 2024-12-25 NOTE — Assessment & Plan Note (Signed)
 BP Readings from Last 3 Encounters:  12/25/24 130/61  08/25/24 (!) 141/77  08/24/24 (!) 148/81   BP stable on hydrochlorothiazide  and entresto .

## 2024-12-25 NOTE — Patient Instructions (Signed)
" °  VISIT SUMMARY: During your visit, we discussed your mental health, medication management, and several ongoing health issues. You shared your feelings of depression and anxiety related to recent life changes, including retirement and changes in your business. We also reviewed your diabetes, cholesterol, and other health concerns.  YOUR PLAN: -DEPRESSION: Depression is a mood disorder that causes persistent feelings of sadness and loss of interest. You are experiencing depression and anxiety related to recent life changes. Continue taking sertraline  (Zoloft ) 100 mg daily. I recommend counseling through the [facility] and encourage you to find new activities or routines to give you a sense of purpose.  -TYPE 2 DIABETES MELLITUS: Type 2 diabetes is a condition that affects the way your body processes blood sugar. Your diabetes is well-controlled with an A1c of 6.2%. Continue with your current diabetes management regimen.  -BENIGN PROSTATIC HYPERPLASIA WITH LOWER URINARY TRACT SYMPTOMS: Benign prostatic hyperplasia is an enlarged prostate gland that can cause urinary symptoms. You are experiencing nocturia and frequent daytime urination, which is normal for you. Continue with your current management.  -HYPERLIPIDEMIA: Hyperlipidemia is having high levels of cholesterol in the blood. You are taking atorvastatin  (Lipitor) 80 mg daily. I have ordered a cholesterol test since your levels have not been checked in a year. Continue taking your medication as prescribed.  -GASTROESOPHAGEAL REFLUX DISEASE: Gastroesophageal reflux disease (GERD) is a condition where stomach acid frequently flows back into the tube connecting your mouth and stomach. You report infrequent heartburn and bloating. Consider using Gas-X for bloating as needed.  -HISTORY OF GOUT: Gout is a form of arthritis characterized by severe pain, redness, and tenderness in joints. You have not had any recent gout flares. Colchicine  0.6 mg is prescribed as  needed for gout flares.  -GENERAL HEALTH MAINTENANCE: We discussed the importance of maintaining a healthy lifestyle, managing your weight, and avoiding alcohol. I encourage you to find new activities to give you a sense of purpose and routine.  INSTRUCTIONS: [facility] Continue with your current medications and management plans. Get your cholesterol test done as ordered. If you experience any new or worsening symptoms, please schedule an appointment.    Contains text generated by [employer].   "

## 2024-12-25 NOTE — Assessment & Plan Note (Signed)
 Has not bothered him recently. Will refill colchicine  to have on hand.

## 2024-12-25 NOTE — Assessment & Plan Note (Signed)
 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  He continues to take lexapro. We discussed looking into getting involved in some community based interests to stay busy and keep a routine.  We also discussed him getting established with a counselor at the [facility] which he plans to do.

## 2025-02-19 ENCOUNTER — Ambulatory Visit: Admitting: Urology

## 2025-02-22 ENCOUNTER — Ambulatory Visit: Admitting: Urology

## 2025-04-28 ENCOUNTER — Ambulatory Visit: Admitting: Family

## 2025-08-17 ENCOUNTER — Ambulatory Visit
